# Patient Record
Sex: Female | Born: 1990 | Race: White | Hispanic: No | Marital: Married | Smoking: Never smoker
Health system: Southern US, Community
[De-identification: ages and names within clinical notes are randomized; demographics above are authoritative.]

## PROBLEM LIST (undated history)

## (undated) ENCOUNTER — Ambulatory Visit: Admission: EM | Payer: 59 | Source: Home / Self Care

## (undated) DIAGNOSIS — K2970 Gastritis, unspecified, without bleeding: Secondary | ICD-10-CM

## (undated) DIAGNOSIS — K259 Gastric ulcer, unspecified as acute or chronic, without hemorrhage or perforation: Secondary | ICD-10-CM

## (undated) DIAGNOSIS — K602 Anal fissure, unspecified: Secondary | ICD-10-CM

## (undated) DIAGNOSIS — F329 Major depressive disorder, single episode, unspecified: Secondary | ICD-10-CM

## (undated) DIAGNOSIS — N281 Cyst of kidney, acquired: Secondary | ICD-10-CM

## (undated) DIAGNOSIS — F32A Depression, unspecified: Secondary | ICD-10-CM

## (undated) DIAGNOSIS — Z87442 Personal history of urinary calculi: Secondary | ICD-10-CM

## (undated) DIAGNOSIS — R519 Headache, unspecified: Secondary | ICD-10-CM

## (undated) DIAGNOSIS — F419 Anxiety disorder, unspecified: Secondary | ICD-10-CM

## (undated) HISTORY — DX: Anal fissure, unspecified: K60.2

## (undated) HISTORY — PX: WISDOM TOOTH EXTRACTION: SHX21

## (undated) HISTORY — PX: COLONOSCOPY: SHX174

---
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
## 2011-04-17 ENCOUNTER — Emergency Department: Payer: Self-pay | Admitting: Emergency Medicine

## 2015-02-26 ENCOUNTER — Observation Stay: Payer: Self-pay

## 2015-03-24 ENCOUNTER — Observation Stay: Payer: Self-pay | Admitting: Obstetrics and Gynecology

## 2015-04-13 ENCOUNTER — Inpatient Hospital Stay
Admit: 2015-04-13 | Disposition: A | Discharge status: Home / Self Care | Payer: Self-pay | Attending: Obstetrics and Gynecology | Admitting: Obstetrics and Gynecology

## 2015-04-13 LAB — CBC WITH DIFFERENTIAL/PLATELET
BASOS ABS: 0.1 10*3/uL (ref 0.0–0.1)
BASOS PCT: 0.3 %
EOS PCT: 0.5 %
Eosinophil #: 0.1 10*3/uL (ref 0.0–0.7)
HCT: 36.9 % (ref 35.0–47.0)
HGB: 12.7 g/dL (ref 12.0–16.0)
LYMPHS ABS: 1.6 10*3/uL (ref 1.0–3.6)
Lymphocyte %: 10.1 %
MCH: 31.6 pg (ref 26.0–34.0)
MCHC: 34.5 g/dL (ref 32.0–36.0)
MCV: 92 fL (ref 80–100)
MONO ABS: 1.1 x10 3/mm — AB (ref 0.2–0.9)
MONOS PCT: 6.7 %
NEUTROS ABS: 13.1 10*3/uL — AB (ref 1.4–6.5)
Neutrophil %: 82.4 %
Platelet: 224 10*3/uL (ref 150–440)
RBC: 4.02 10*6/uL (ref 3.80–5.20)
RDW: 13.3 % (ref 11.5–14.5)
WBC: 15.9 10*3/uL — ABNORMAL HIGH (ref 3.6–11.0)

## 2015-04-15 LAB — HEMOGLOBIN: HGB: 11.1 g/dL — ABNORMAL LOW (ref 12.0–16.0)

## 2015-04-30 NOTE — Op Note (Signed)
PATIENT NAME:  Barbara Cole, Barbara Cole MR#:  [id number] DATE OF BIRTH:  1991/11/22  DATE OF PROCEDURE:  04/13/2015  PREOPERATIVE DIAGNOSES:  1.  Intrauterine pregnancy at term.  2.  Active labor.  3.  Group Cole streptococcus negative.  4.  O positive .  5.  Rubella nonimmune.   POSTOPERATIVE DIAGNOSES:  1.  Intrauterine pregnancy at term.  2.  Active labor.  3.  Group Cole streptococcus negative.  4.  O positive .  5.  Rubella nonimmune 6.  Delivery of a viable female infant.   SURGEON: Cline CoolsBethany E. Ryatt Corsino, MD  PROCEDURE: Normal spontaneous vaginal delivery.   ESTIMATED BLOOD LOSS: 300 mL.   ANESTHESIA: Epidural and 10 mL of 1% lidocaine locally.   SPECIMENS: Fetal cord blood.   COMPLICATIONS: None.   FINDINGS: A viable female infant, named Barbara Cole, with Apgars of 8 and 8 at one and five minutes respectively. No fetal weight is known at this time.  INDICATION FOR PROCEDURE: Ms. Barbara Cole is a 24 year old gravida 1, para 0 who presented at 39-4/7 weeks' estimated gestational age by last menstrual period with contractions. She was found to be early labor and was admitted for expectant management.   The patient did receive Pitocin augmentation as well as an epidural during labor. Fetal heart rate tracing remained category 1 throughout until the final stages of pushing. She had progressed slowly through active labor with artificial rupture of membranes for clear fluid and placement of an intrauterine pressure catheter for Pitocin titration. She did enter the second stage of labor at which point she began to push over an intact perineum.   The patient pushed effectively with the baby in an OP position for the final 10 minutes of the second stage. Fetal tachycardia and maternal tachycardia was noted; however, there was no maternal fever nor odor during this time. The baby was delivered over an intact perineum. Fetal shoulders followed the fetal head promptly. There was no nuchal cord. The baby  did cry on the perineum. The cord was doubly clamped and cut and the baby was passed onto the maternal abdomen where he was placed skin to skin. A 3-vessel cord was noted and cord blood was collected. The placenta was expressed with trailing membranes. Manual evaluation of the endometrial lining revealed retained membranes. These were teased out and manually extracted; a total of 4 intrauterine passes were required to extract all retained membranes. The fundus was firm and bleeding was minimal. Postpartum Pitocin was given.   Attention was turned to the perineum. A small first-degree perineal laceration was noted and repaired. Bilateral labial lacerations were noted and repaired with 3-0 Vicryl Rapide. A clitoral laceration was noted and was not repaired due to maternal discomfort and preference; this laceration was far from the urethral opening. The cervix was evaluated and found to be intact. Fundal pressure revealed a firm fundus with minimal bleeding.   The baby was taken to special care nursery for retractions and grunting with breathing. At this time, the baby's weight is unknown.   The patient tolerated this procedure well and is stable in the labor and delivery room.    ____________________________ Cline CoolsBethany E. Aaleeyah Bias, MD beb:bm D: 04/14/2015 00:44:59 ET T: 04/14/2015 07:43:23 ET JOB#: [id number]  cc: Cline CoolsBethany E. Maelin Kurkowski, MD, <Dictator> Cline CoolsBETHANY E Annayah Worthley MD ELECTRONICALLY SIGNED 04/14/2015 10:45

## 2015-05-09 NOTE — H&P (Signed)
L&D Evaluation:  History:  HPI 24 yo G1P000 with LMP of 07/10/14 & ED[facility] 04/16/15 [facility] for "UC's starting right after sex this pm". C/O "cramping in the Rt and Lt sides and lower back pain", No VB, LOF, decreased FM or any other S/S. PNC signficant for normal AFP, Rubella non-immune, O pos, AB screen neg, Pap neg and HIV neg. GBS not done yet.   Presents with back pain, contractions   Patient's Medical History Anxiety   Patient's Surgical History Colonoscopy, wisdom teeth, C4 fx   Medications Pre Natal Vitamins   Allergies NKDA   Social History none   Family History Non-Contributory   ROS:  ROS All systems were reviewed.  HEENT, CNS, GI, GU, Respiratory, CV, Renal and Musculoskeletal systems were found to be normal.   Exam:  Vital Signs stable   General no apparent distress, other   Mental Status clear   Chest clear   Heart normal sinus rhythm, no murmur/gallop/rubs   Abdomen gravid, non-tender   Estimated Fetal Weight Average for gestational age   Back no CVAT   Edema no edema   Reflexes 2+   Clonus negative   Pelvic FT/thick/high   Mebranes Intact   FHT normal rate with no decels, reactive NST with 2 accels 15 x 15 BPM   Ucx regular   Skin dry   Lymph no lymphadenopathy   Impression:  Impression IUP at 33 weeks with Th PTL   Plan:  Plan DC home   Comments Pelvic rest. Tylenol 650 mg po q 4 hours prn pain   Electronic Signatures: Sharee PimpleJones, Leyana Whidden W (CNM)  (Signed 28-Feb-16 01:58)  Authored: L&D Evaluation   Last Updated: 28-Feb-16 01:58 by Sharee PimpleJones, Wylder Macomber W (CNM)

## 2015-05-09 NOTE — H&P (Signed)
L&D Evaluation:  History:  HPI 24 yo G1 @39 +4 by  LMP c/w 19 week US presenting with contractions. They began at a rate of Q10 minutes and have persisted to now being Q5 minutes. They are more painful and lasting for a minute. She has had associated spotting but denies leaking fluid or decreased fetal movement. In triage, SVE changed from 2/50/-3 to 3/50/-3.   Presents with contractions   Patient's Medical History No Chronic Illness   Patient's Surgical History none   Medications Pre Natal Vitamins   Allergies NKDA   Social History none   Family History Non-Contributory   ROS:  ROS All systems were reviewed.  HEENT, CNS, GI, GU, Respiratory, CV, Renal and Musculoskeletal systems were found to be normal.   Exam:  Vital Signs stable   General no apparent distress   Mental Status clear   Chest clear   Heart normal sinus rhythm   Abdomen gravid, non-tender   Estimated Fetal Weight Average for gestational age   Pelvic no external lesions   Mebranes Intact   FHT normal rate with no decels   Ucx regular   Other O+ GBS neg Rub NI   Impression:  Impression early labor   Plan:  Comments 1. Labor: Plan to keep for monitoring. Will allow to walk. Continue to monitor progress. Epidural PRN 2. FWB: Well-grown, Cat 1 3. Anticipate SVD.   Electronic Signatures: Areta HaberGoodman, David M (MD)  (Signed 14-Apr-16 06:17)  Authored: L&D Evaluation   Last Updated: 14-Apr-16 06:17 by Areta HaberGoodman, David M (MD)

## 2016-08-23 DIAGNOSIS — H5203 Hypermetropia, bilateral: Secondary | ICD-10-CM | POA: Diagnosis not present

## 2017-01-31 DIAGNOSIS — F4323 Adjustment disorder with mixed anxiety and depressed mood: Secondary | ICD-10-CM | POA: Diagnosis not present

## 2017-09-19 DIAGNOSIS — H5213 Myopia, bilateral: Secondary | ICD-10-CM | POA: Diagnosis not present

## 2018-03-20 ENCOUNTER — Encounter: Payer: Self-pay | Admitting: Obstetrics and Gynecology

## 2019-02-17 DIAGNOSIS — J069 Acute upper respiratory infection, unspecified: Secondary | ICD-10-CM | POA: Diagnosis not present

## 2019-03-25 DIAGNOSIS — F419 Anxiety disorder, unspecified: Secondary | ICD-10-CM | POA: Diagnosis not present

## 2019-06-07 DIAGNOSIS — R319 Hematuria, unspecified: Secondary | ICD-10-CM | POA: Diagnosis not present

## 2019-06-07 DIAGNOSIS — R300 Dysuria: Secondary | ICD-10-CM | POA: Diagnosis not present

## 2019-06-07 DIAGNOSIS — M545 Low back pain: Secondary | ICD-10-CM | POA: Diagnosis not present

## 2019-06-18 ENCOUNTER — Encounter (HOSPITAL_BASED_OUTPATIENT_CLINIC_OR_DEPARTMENT_OTHER): Payer: Self-pay

## 2019-06-18 ENCOUNTER — Other Ambulatory Visit: Payer: Self-pay

## 2019-06-18 ENCOUNTER — Emergency Department (HOSPITAL_BASED_OUTPATIENT_CLINIC_OR_DEPARTMENT_OTHER)
Admission: EM | Admit: 2019-06-18 | Discharge: 2019-06-18 | Disposition: A | Discharge status: Home / Self Care | Payer: 59 | Attending: Emergency Medicine | Admitting: Emergency Medicine

## 2019-06-18 ENCOUNTER — Emergency Department (HOSPITAL_BASED_OUTPATIENT_CLINIC_OR_DEPARTMENT_OTHER): Payer: 59

## 2019-06-18 DIAGNOSIS — R079 Chest pain, unspecified: Secondary | ICD-10-CM | POA: Insufficient documentation

## 2019-06-18 DIAGNOSIS — Z79899 Other long term (current) drug therapy: Secondary | ICD-10-CM | POA: Insufficient documentation

## 2019-06-18 HISTORY — DX: Anxiety disorder, unspecified: F41.9

## 2019-06-18 LAB — CBC WITH DIFFERENTIAL/PLATELET
Abs Immature Granulocytes: 0.03 10*3/uL (ref 0.00–0.07)
Basophils Absolute: 0.1 10*3/uL (ref 0.0–0.1)
Basophils Relative: 1 %
Eosinophils Absolute: 0.1 10*3/uL (ref 0.0–0.5)
Eosinophils Relative: 1 %
HCT: 44 % (ref 36.0–46.0)
Hemoglobin: 14.6 g/dL (ref 12.0–15.0)
Immature Granulocytes: 0 %
Lymphocytes Relative: 15 %
Lymphs Abs: 1.7 10*3/uL (ref 0.7–4.0)
MCH: 30 pg (ref 26.0–34.0)
MCHC: 33.2 g/dL (ref 30.0–36.0)
MCV: 90.5 fL (ref 80.0–100.0)
Monocytes Absolute: 0.7 10*3/uL (ref 0.1–1.0)
Monocytes Relative: 6 %
Neutro Abs: 8.4 10*3/uL — ABNORMAL HIGH (ref 1.7–7.7)
Neutrophils Relative %: 77 %
Platelets: 308 10*3/uL (ref 150–400)
RBC: 4.86 MIL/uL (ref 3.87–5.11)
RDW: 12 % (ref 11.5–15.5)
WBC: 10.9 10*3/uL — ABNORMAL HIGH (ref 4.0–10.5)
nRBC: 0 % (ref 0.0–0.2)

## 2019-06-18 LAB — COMPREHENSIVE METABOLIC PANEL
ALT: 19 U/L (ref 0–44)
AST: 21 U/L (ref 15–41)
Albumin: 4.3 g/dL (ref 3.5–5.0)
Alkaline Phosphatase: 61 U/L (ref 38–126)
Anion gap: 10 (ref 5–15)
BUN: 12 mg/dL (ref 6–20)
CO2: 23 mmol/L (ref 22–32)
Calcium: 9.3 mg/dL (ref 8.9–10.3)
Chloride: 106 mmol/L (ref 98–111)
Creatinine, Ser: 0.77 mg/dL (ref 0.44–1.00)
GFR calc Af Amer: >60 mL/min (ref >60)
GFR calc non Af Amer: >60 mL/min (ref >60)
Glucose, Bld: 97 mg/dL (ref 70–99)
Potassium: 3.7 mmol/L (ref 3.5–5.1)
Sodium: 139 mmol/L (ref 135–145)
Total Bilirubin: 1 mg/dL (ref 0.3–1.2)
Total Protein: 7.9 g/dL (ref 6.5–8.1)

## 2019-06-18 LAB — LIPASE, BLOOD: Lipase: 33 U/L (ref 11–51)

## 2019-06-18 LAB — TROPONIN I: Troponin I: <0.03 ng/mL (ref <0.03)

## 2019-06-18 MED ORDER — SUCRALFATE 1 G PO TABS: 1.0000 g | ORAL_TABLET | Freq: Three times a day (TID) | ORAL | 0 refills | Status: DC

## 2019-06-18 MED ORDER — PANTOPRAZOLE SODIUM 20 MG PO TBEC: 20.0000 mg | DELAYED_RELEASE_TABLET | Freq: Two times a day (BID) | ORAL | 0 refills | Status: DC

## 2019-06-18 MED ORDER — ALUM & MAG HYDROXIDE-SIMETH 200-200-20 MG/5ML PO SUSP
30.0000 mL | Freq: Once | ORAL | Status: AC
  Administered 2019-06-18: 30 mL via ORAL
  Filled 2019-06-18: qty 30

## 2019-06-18 MED ORDER — LIDOCAINE VISCOUS HCL 2 % MT SOLN
15.0000 mL | Freq: Once | OROMUCOSAL | Status: AC
  Administered 2019-06-18: 14:00:00 15 mL via ORAL
  Filled 2019-06-18: qty 15

## 2019-06-18 MED FILL — PANTOPRAZOLE SOD DR 20 MG T: 20 | 30 days supply | Qty: 60 | Fill #0

## 2019-06-18 MED FILL — SUCRALFATE 1 GM TABLET: 1 | 30 days supply | Qty: 120 | Fill #0

## 2019-06-18 NOTE — ED Provider Notes (Signed)
[facility] EMERGENCY DEPARTMENT Provider Note   CSN: [id number] Arrival date & time: 06/18/19  1232    History   Chief Complaint Chief Complaint  Patient presents with  . Chest Pain    HPI Barbara Cole is a 28 y.o. female.     The history is provided by the patient and medical records. No language interpreter was used.  Chest Pain Associated symptoms: abdominal pain and nausea   Associated symptoms: no palpitations and no vomiting      Barbara Cole is a 28 y.o. female  with a PMH of anxiety who presents to the Emergency Department complaining of epigastric pain.  Patient states that she woke up at about 345 this morning.  Pain actually woke her from her sleep.  She reports burning, constant epigastric pain.  She started her menstrual cycle yesterday, so assumed this was just a weird type of cramping.  She took Pamprin, but it did not help.  When she laid down, the pain began to radiate up towards her chest.  She felt very flushed and did not feel well.  She thought if she got up and moves around, maybe eating something it would feel better.  She tried to eat a cracker, but suddenly developed chills and was very nauseous.  That was around 630 this morning.  She then called 911.  EMS went to the house and evaluate.  Reports normal vitals and exam.  They told her they thought it was reflux and recommended that she get Pepcid.  She did take Pepcid and felt as if it helped a great deal initially, but her pain returned.  She also reports taking Tums which made some improvement as well.  She was starting to feel better, therefore went to meet her brother for lunch.  She took 1 or 2 bites of her food in the same sensation occurred with burning epigastric pain and a "cold" sensation along her chest wall.  She became worried, therefore came to emergency department for further evaluation. No urinary symptoms. Denies any vomiting, diarrhea, constipation or blood in the stool.  No  fevers, shortness of breath or cough.  No personal history of hypertension, hyperlipidemia, diabetes or heart disease.  No family cardiac history.  No recent travel/surgery/immobilizations.  Not on OCPs.  Denies any history of similar episodes in the past.  No fam cardiac hx. No recent travel / surgeries. No leg swelling.   Past Medical History:  Diagnosis Date  . Anxiety     There are no active problems to display for this patient.   History reviewed. No pertinent surgical history.   OB History   No obstetric history on file.      Home Medications    Prior to Admission medications   Medication Sig Start Date End Date Taking? Authorizing Provider  sertraline (ZOLOFT) 25 MG tablet Take 25 mg by mouth daily.   Yes [provider]  pantoprazole (PROTONIX) 20 MG tablet Take 1 tablet (20 mg total) by mouth 2 (two) times daily. 06/18/19   Sissi Padia, Chase PicketJaime Pilcher, PA-C  sucralfate (CARAFATE) 1 g tablet Take 1 tablet (1 g total) by mouth 4 (four) times daily -  with meals and at bedtime. 06/18/19   Bari Leib, Chase PicketJaime Pilcher, PA-C    Family History No family history on file.  Social History Social History   Tobacco Use  . Smoking status: Never Smoker  . Smokeless tobacco: Never Used  Substance Use Topics  . Alcohol  use: Never    Frequency: Never  . Drug use: Never     Allergies   Patient has no known allergies.   Review of Systems Review of Systems  Cardiovascular: Positive for chest pain. Negative for palpitations and leg swelling.  Gastrointestinal: Positive for abdominal pain and nausea. Negative for constipation, diarrhea, rectal pain and vomiting.  Neurological: Positive for light-headedness.  All other systems reviewed and are negative.    Physical Exam Updated Vital Signs BP (!) 138/98   Pulse 83   Temp 98.6 F (37 C) (Oral)   Resp 11   Ht 5\' 2"  (1.575 m)   Wt 87.1 kg   LMP 06/17/2019   SpO2 98%   BMI 35.12 kg/m   Physical Exam Vitals signs and  nursing note reviewed.  Constitutional:      General: She is not in acute distress.    Appearance: She is well-developed.  HENT:     Head: Normocephalic and atraumatic.  Neck:     Musculoskeletal: Neck supple.  Cardiovascular:     Rate and Rhythm: Normal rate and regular rhythm.     Heart sounds: Normal heart sounds. No murmur.  Pulmonary:     Effort: Pulmonary effort is normal. No respiratory distress.     Breath sounds: Normal breath sounds.  Abdominal:     General: There is no distension.     Palpations: Abdomen is soft.     Comments: Tenderness to the epigastrium.  No right upper quadrant tenderness.  Negative Murphy's.  No flank or CVA tenderness.  No rebound or guarding.  Musculoskeletal:        General: No swelling or tenderness.     Right lower leg: No edema.     Left lower leg: No edema.  Skin:    General: Skin is warm and dry.  Neurological:     Mental Status: She is alert and oriented to person, place, and time.      ED Treatments / Results  Labs (all labs ordered are listed, but only abnormal results are displayed) Labs Reviewed  CBC WITH DIFFERENTIAL/PLATELET - Abnormal; Notable for the following components:      Result Value   WBC 10.9 (*)    Neutro Abs 8.4 (*)    All other components within normal limits  COMPREHENSIVE METABOLIC PANEL  LIPASE, BLOOD  TROPONIN I    EKG None  ED ECG REPORT   Date: 06/18/2019  Rate: 89  Rhythm: normal sinus rhythm  QRS Axis: normal  Intervals: normal  ST/T Wave abnormalities: normal  Conduction Disutrbances:none  Narrative Interpretation:   Old EKG Reviewed: unchanged  I have personally reviewed the EKG tracing with attending, Dr. Madilyn Hookees and we agree with the computerized printout as noted.  Radiology Dg Chest 2 View  Result Date: 06/18/2019 CLINICAL DATA:  Chest pain and nausea since this am,nonsmoker EXAM: CHEST - 2 VIEW COMPARISON:  none FINDINGS: Lungs are clear. Heart size and mediastinal contours are  within normal limits. No effusion.  No pneumothorax. Visualized bones unremarkable. IMPRESSION: No acute cardiopulmonary disease. Electronically Signed   By: Corlis Leak  Hassell M.D.   On: 06/18/2019 14:07    Procedures Procedures (including critical care time)  Medications Ordered in ED Medications  alum & mag hydroxide-simeth (MAALOX/MYLANTA) 200-200-20 MG/5ML suspension 30 mL (30 mLs Oral Given 06/18/19 1400)    And  lidocaine (XYLOCAINE) 2 % viscous mouth solution 15 mL (15 mLs Oral Given 06/18/19 1400)     Initial Impression /  Assessment and Plan / ED Course  I have reviewed the triage vital signs and the nursing notes.  Pertinent labs & imaging results that were available during my care of the patient were reviewed by me and considered in my medical decision making (see chart for details).     Barbara Cole is a 28 y.o. female who presents to ED for gastric abdominal pain which radiates to her chest which began this morning.  On exam, patient does have tenderness to the epigastrium.  No tenderness to the right upper quadrant.  Negative Murphy's.  Labs reviewed and reassuring with negative troponin. CXR with no acute abnormalities.  EKG unchanged from previous. PERC negative Low risk heart score of 0.   Patient's symptoms unlikely to be of cardiac etiology.  Suspect gastritis versus peptic ulcer.  She did feel much improved after GI cocktail.  We will start her on Protonix and Carafate and have her follow-up with her primary care doctor. Patient understands return precautions and follow up plan. All questions answered.  Final Clinical Impressions(s) / ED Diagnoses   Final diagnoses:  Chest pain    ED Discharge Orders         Ordered    sucralfate (CARAFATE) 1 g tablet  3 times daily with meals & bedtime     06/18/19 1457    pantoprazole (PROTONIX) 20 MG tablet  2 times daily     06/18/19 1457           Wilburn Keir, Ozella Almond, PA-C 06/18/19 1502    Quintella Reichert, MD  06/19/19 1318

## 2019-06-18 NOTE — Discharge Instructions (Signed)
It was my pleasure taking care of you today!   Fortunately, your EKG, labs and x-ray today looked great.   Take Carafate and Protonix as directed.   Call your doctor today or first thing Monday morning to schedule a follow up appointment.   Return to ER for new or worsening symptoms, any additional concerns.

## 2019-06-18 NOTE — ED Triage Notes (Addendum)
Pt c/o central CP and epigastric pain that started at 345am-states she felt like she was going to pass out-EMS was called out-pt refused transport-NAD-tearful-steady gait-states she came in because "the feeling came back" while she was at lunch just PTA

## 2019-06-18 NOTE — ED Notes (Signed)
Pt states burning in chest persists, VSS, awaiting provider evaluation.

## 2019-06-18 NOTE — ED Notes (Signed)
Pt states epigastric burning sensation occurred at 3am, took pepcid at that time and tums this morning with some relief.  Pt reports attempting to eat lunch today and had sudden burning epigastric area again and felt sweaty and weak.

## 2019-06-19 ENCOUNTER — Emergency Department (HOSPITAL_BASED_OUTPATIENT_CLINIC_OR_DEPARTMENT_OTHER)
Admission: EM | Admit: 2019-06-19 | Discharge: 2019-06-19 | Disposition: A | Discharge status: Home / Self Care | Payer: 59 | Attending: Emergency Medicine | Admitting: Emergency Medicine

## 2019-06-19 ENCOUNTER — Encounter (HOSPITAL_BASED_OUTPATIENT_CLINIC_OR_DEPARTMENT_OTHER): Payer: Self-pay | Admitting: *Deleted

## 2019-06-19 ENCOUNTER — Encounter: Payer: Self-pay | Admitting: Emergency Medicine

## 2019-06-19 ENCOUNTER — Emergency Department
Admission: EM | Admit: 2019-06-19 | Discharge: 2019-06-19 | Disposition: A | Discharge status: Home / Self Care | Payer: 59 | Attending: Emergency Medicine | Admitting: Emergency Medicine

## 2019-06-19 ENCOUNTER — Other Ambulatory Visit: Payer: Self-pay

## 2019-06-19 DIAGNOSIS — R1013 Epigastric pain: Secondary | ICD-10-CM | POA: Insufficient documentation

## 2019-06-19 DIAGNOSIS — M549 Dorsalgia, unspecified: Secondary | ICD-10-CM | POA: Diagnosis not present

## 2019-06-19 DIAGNOSIS — Z5321 Procedure and treatment not carried out due to patient leaving prior to being seen by health care provider: Secondary | ICD-10-CM | POA: Insufficient documentation

## 2019-06-19 DIAGNOSIS — R079 Chest pain, unspecified: Secondary | ICD-10-CM | POA: Diagnosis not present

## 2019-06-19 DIAGNOSIS — R110 Nausea: Secondary | ICD-10-CM | POA: Diagnosis not present

## 2019-06-19 LAB — URINALYSIS, COMPLETE (UACMP) WITH MICROSCOPIC
Bilirubin Urine: NEGATIVE
Glucose, UA: NEGATIVE mg/dL
Hgb urine dipstick: NEGATIVE
Ketones, ur: NEGATIVE mg/dL
Leukocytes,Ua: NEGATIVE
Nitrite: NEGATIVE
Protein, ur: NEGATIVE mg/dL
Specific Gravity, Urine: 1.02 (ref 1.005–1.030)
pH: 5 (ref 5.0–8.0)

## 2019-06-19 LAB — CBC
HCT: 46 % (ref 36.0–46.0)
Hemoglobin: 15.5 g/dL — ABNORMAL HIGH (ref 12.0–15.0)
MCH: 29.9 pg (ref 26.0–34.0)
MCHC: 33.7 g/dL (ref 30.0–36.0)
MCV: 88.8 fL (ref 80.0–100.0)
Platelets: 325 10*3/uL (ref 150–400)
RBC: 5.18 MIL/uL — ABNORMAL HIGH (ref 3.87–5.11)
RDW: 12.1 % (ref 11.5–15.5)
WBC: 7 10*3/uL (ref 4.0–10.5)
nRBC: 0 % (ref 0.0–0.2)

## 2019-06-19 LAB — COMPREHENSIVE METABOLIC PANEL
ALT: 21 U/L (ref 0–44)
AST: 21 U/L (ref 15–41)
Albumin: 4.4 g/dL (ref 3.5–5.0)
Alkaline Phosphatase: 59 U/L (ref 38–126)
Anion gap: 10 (ref 5–15)
BUN: 13 mg/dL (ref 6–20)
CO2: 24 mmol/L (ref 22–32)
Calcium: 9.5 mg/dL (ref 8.9–10.3)
Chloride: 107 mmol/L (ref 98–111)
Creatinine, Ser: 0.9 mg/dL (ref 0.44–1.00)
GFR calc Af Amer: >60 mL/min (ref >60)
GFR calc non Af Amer: >60 mL/min (ref >60)
Glucose, Bld: 110 mg/dL — ABNORMAL HIGH (ref 70–99)
Potassium: 3.8 mmol/L (ref 3.5–5.1)
Sodium: 141 mmol/L (ref 135–145)
Total Bilirubin: 1.6 mg/dL — ABNORMAL HIGH (ref 0.3–1.2)
Total Protein: 7.9 g/dL (ref 6.5–8.1)

## 2019-06-19 LAB — PREGNANCY, URINE: Preg Test, Ur: NEGATIVE

## 2019-06-19 LAB — LIPASE, BLOOD: Lipase: 35 U/L (ref 11–51)

## 2019-06-19 MED ORDER — SODIUM CHLORIDE 0.9% FLUSH: 3.0000 mL | Freq: Once | INTRAVENOUS | Status: DC

## 2019-06-19 MED ORDER — LIDOCAINE VISCOUS HCL 2 % MT SOLN
15.0000 mL | Freq: Once | OROMUCOSAL | Status: AC
  Administered 2019-06-19: 15 mL via ORAL
  Filled 2019-06-19: qty 15

## 2019-06-19 MED ORDER — MAALOX MAX 400-400-40 MG/5ML PO SUSP: 10.0000 mL | Freq: Four times a day (QID) | ORAL | 0 refills | Status: DC | PRN

## 2019-06-19 MED ORDER — ALUM & MAG HYDROXIDE-SIMETH 200-200-20 MG/5ML PO SUSP
30.0000 mL | Freq: Once | ORAL | Status: AC
  Administered 2019-06-19: 30 mL via ORAL
  Filled 2019-06-19: qty 30

## 2019-06-19 MED ORDER — ONDANSETRON 4 MG PO TBDP
4.0000 mg | ORAL_TABLET | Freq: Once | ORAL | Status: AC | PRN
  Administered 2019-06-19: 4 mg via ORAL
  Filled 2019-06-19: qty 1

## 2019-06-19 MED ORDER — ONDANSETRON 4 MG PO TBDP: 4.0000 mg | ORAL_TABLET | Freq: Three times a day (TID) | ORAL | 0 refills | Status: DC | PRN

## 2019-06-19 NOTE — ED Provider Notes (Signed)
Emergency Department Provider Note   I have reviewed the triage vital signs and the nursing notes.   HISTORY  Chief Complaint Epigastric pain   HPI Barbara Cole is a 28 y.o. female presents to the ED with continued epigastric pain. Pain is burning in quality and radiates to the left shoulder with "tingling" sensation. No SOB or heart palpitations. No fever or chills was in the ED yesterday for same and improved with GI cocktail but pain returned. Patient has been taking prescribed meds. Labs, imaging, and EKG at that time were negative. [facility] and LWBS from the waiting room and presented here. Labs drawn in the ED prior to leaving. Patient without significant pain at this time. Was given Zofran there and feeling better. No fever or chills.    Past Medical History:  Diagnosis Date  . Anxiety     There are no active problems to display for this patient.   History reviewed. No pertinent surgical history.  Allergies Patient has no known allergies.  History reviewed. No pertinent family history.  Social History Social History   Tobacco Use  . Smoking status: Never Smoker  . Smokeless tobacco: Never Used  Substance Use Topics  . Alcohol use: Never    Frequency: Never  . Drug use: Never    Review of Systems  Constitutional: No fever/chills Eyes: No visual changes. ENT: No sore throat. Cardiovascular: Denies chest pain. Respiratory: Denies shortness of breath. Gastrointestinal: Positive epigastric abdominal pain.  Positive nausea, no vomiting.  No diarrhea.  No constipation. Genitourinary: Negative for dysuria. Musculoskeletal: Negative for back pain. Skin: Negative for rash. Neurological: Negative for headaches, focal weakness or numbness.  10-point ROS otherwise negative.  ____________________________________________   PHYSICAL EXAM:  VITAL SIGNS: ED Triage Vitals  Enc Vitals Group     BP 06/19/19 1111 (!) 127/92    Pulse Rate 06/19/19 1111 73     Resp 06/19/19 1111 (!) 23     Temp 06/19/19 1111 98.2 F (36.8 C)     Temp Source 06/19/19 1111 Oral     SpO2 06/19/19 1111 99 %     Weight 06/19/19 1113 192 lb 0.3 oz (87.1 kg)     Height 06/19/19 1113 5\' 2"  (1.575 m)   Constitutional: Alert and oriented. Well appearing and in no acute distress. Eyes: Conjunctivae are normal. Head: Atraumatic. Nose: No congestion/rhinnorhea. Mouth/Throat: Mucous membranes are moist.  Neck: No stridor.  Cardiovascular: Normal rate, regular rhythm. Good peripheral circulation. Grossly normal heart sounds.   Respiratory: Normal respiratory effort.  No retractions. Lungs CTAB. Gastrointestinal: Soft and nontender. Specifically no RUQ tenderness or Murphy's sign. No distention.  Musculoskeletal: No gross deformities of extremities. Neurologic:  Normal speech and language.  Skin:  Skin is warm, dry and intact. No rash noted.  ____________________________________________   LABS (all labs ordered are listed, but only abnormal results are displayed)  Labs Reviewed  PREGNANCY, URINE   ____________________________________________  EKG  [facility] No STEMI  ____________________________________________  RADIOLOGY  No results found.  ____________________________________________   PROCEDURES  Procedure(s) performed:   Procedures  None ____________________________________________   INITIAL IMPRESSION / ASSESSMENT AND PLAN / ED COURSE  Pertinent labs & imaging results that were available during my care of the patient were reviewed by me and considered in my medical decision making (see chart for details).   Patient presents to the ED with epigastric pain with atypical radiation symptoms to the chest. Doubt ACS/PE.  Had full w/u in the ED yesterday and improved with GI cocktail. Had labs drawn this AM at [facility] with no significant change other than slightly elevated bilirubin. Normal AST/ALT. No  focal tenderness on exam. No further imaging at this time. Added urine pregnancy which was negative. Will discharge with plan for PCP and GI follow up with Maalox and Zofran PRN along with already prescribed Protonix.    ____________________________________________  FINAL CLINICAL IMPRESSION(S) / ED DIAGNOSES  Final diagnoses:  Epigastric abdominal pain     MEDICATIONS GIVEN DURING THIS VISIT:  Medications  alum & mag hydroxide-simeth (MAALOX/MYLANTA) 200-200-20 MG/5ML suspension 30 mL (30 mLs Oral Given 06/19/19 1205)    And  lidocaine (XYLOCAINE) 2 % viscous mouth solution 15 mL (15 mLs Oral Given 06/19/19 1205)     NEW OUTPATIENT MEDICATIONS STARTED DURING THIS VISIT:  Discharge Medication List as of 06/19/2019 12:43 PM    START taking these medications   Details  alum & mag hydroxide-simeth (MAALOX MAX) 400-400-40 MG/5ML suspension Take 10 mLs by mouth every 6 (six) hours as needed for indigestion., Starting Sat 06/19/2019, Print    ondansetron (ZOFRAN ODT) 4 MG disintegrating tablet Take 1 tablet (4 mg total) by mouth every 8 (eight) hours as needed for nausea., Starting Sat 06/19/2019, Print        Note:  This document was prepared using Dragon voice recognition software and may include unintentional dictation errors.  Nanda Quinton, MD Emergency Medicine    Icarus Partch, [facility], MD 06/20/19 [phone]

## 2019-06-19 NOTE — Discharge Instructions (Signed)

## 2019-06-19 NOTE — ED Triage Notes (Signed)
Burning sensation to epigastric area.  Patient was here yesterday for the same complaint.  Had GI cocktail yesterday with good relief.  Pain came back at 1930, took Sucralfate and Pantoprazole with no relief.  Went to [facility], blood drawn done and she left d/t long wait.

## 2019-06-19 NOTE — ED Triage Notes (Signed)
Pt to ED via POV c/o chest pain and nausea. Pt seen at [facility] yesterday and diagnosed with GERD and possible ulcer. Pt was given 2 medications but states that she does not feel like it is helping. Pt has not vomited. Pt is in NAD.

## 2019-06-21 DIAGNOSIS — R110 Nausea: Secondary | ICD-10-CM | POA: Diagnosis not present

## 2019-06-21 DIAGNOSIS — K219 Gastro-esophageal reflux disease without esophagitis: Secondary | ICD-10-CM | POA: Diagnosis not present

## 2019-06-21 DIAGNOSIS — R1013 Epigastric pain: Secondary | ICD-10-CM | POA: Diagnosis not present

## 2019-06-22 ENCOUNTER — Encounter: Payer: Self-pay | Admitting: Intensive Care

## 2019-06-22 ENCOUNTER — Emergency Department
Admission: EM | Admit: 2019-06-22 | Discharge: 2019-06-22 | Disposition: A | Discharge status: Home / Self Care | Payer: 59 | Attending: Emergency Medicine | Admitting: Emergency Medicine

## 2019-06-22 ENCOUNTER — Other Ambulatory Visit: Payer: Self-pay

## 2019-06-22 ENCOUNTER — Emergency Department: Payer: 59

## 2019-06-22 DIAGNOSIS — K2900 Acute gastritis without bleeding: Secondary | ICD-10-CM

## 2019-06-22 DIAGNOSIS — N281 Cyst of kidney, acquired: Secondary | ICD-10-CM | POA: Diagnosis not present

## 2019-06-22 DIAGNOSIS — R1010 Upper abdominal pain, unspecified: Secondary | ICD-10-CM | POA: Diagnosis not present

## 2019-06-22 DIAGNOSIS — R1011 Right upper quadrant pain: Secondary | ICD-10-CM | POA: Diagnosis not present

## 2019-06-22 DIAGNOSIS — K2970 Gastritis, unspecified, without bleeding: Secondary | ICD-10-CM | POA: Diagnosis not present

## 2019-06-22 DIAGNOSIS — N200 Calculus of kidney: Secondary | ICD-10-CM | POA: Diagnosis not present

## 2019-06-22 DIAGNOSIS — R1013 Epigastric pain: Secondary | ICD-10-CM | POA: Diagnosis not present

## 2019-06-22 LAB — CBC
HCT: 44.8 % (ref 36.0–46.0)
Hemoglobin: 15.3 g/dL — ABNORMAL HIGH (ref 12.0–15.0)
MCH: 30.7 pg (ref 26.0–34.0)
MCHC: 34.2 g/dL (ref 30.0–36.0)
MCV: 89.8 fL (ref 80.0–100.0)
Platelets: 325 10*3/uL (ref 150–400)
RBC: 4.99 MIL/uL (ref 3.87–5.11)
RDW: 11.8 % (ref 11.5–15.5)
WBC: 9.5 10*3/uL (ref 4.0–10.5)
nRBC: 0 % (ref 0.0–0.2)

## 2019-06-22 LAB — COMPREHENSIVE METABOLIC PANEL WITH GFR
ALT: 35 U/L (ref 0–44)
AST: 32 U/L (ref 15–41)
Albumin: 4.6 g/dL (ref 3.5–5.0)
Alkaline Phosphatase: 62 U/L (ref 38–126)
Anion gap: 8 (ref 5–15)
BUN: 15 mg/dL (ref 6–20)
CO2: 23 mmol/L (ref 22–32)
Calcium: 9.2 mg/dL (ref 8.9–10.3)
Chloride: 108 mmol/L (ref 98–111)
Creatinine, Ser: 0.86 mg/dL (ref 0.44–1.00)
GFR calc Af Amer: >60 mL/min
GFR calc non Af Amer: >60 mL/min
Glucose, Bld: 100 mg/dL — ABNORMAL HIGH (ref 70–99)
Potassium: 3.9 mmol/L (ref 3.5–5.1)
Sodium: 139 mmol/L (ref 135–145)
Total Bilirubin: 1.3 mg/dL — ABNORMAL HIGH (ref 0.3–1.2)
Total Protein: 7.9 g/dL (ref 6.5–8.1)

## 2019-06-22 LAB — URINALYSIS, COMPLETE (UACMP) WITH MICROSCOPIC
Bilirubin Urine: NEGATIVE
Glucose, UA: NEGATIVE mg/dL
Hgb urine dipstick: NEGATIVE
Ketones, ur: 20 mg/dL — AB
Leukocytes,Ua: NEGATIVE
Nitrite: NEGATIVE
Protein, ur: NEGATIVE mg/dL
Specific Gravity, Urine: 1.019 (ref 1.005–1.030)
pH: 5 (ref 5.0–8.0)

## 2019-06-22 LAB — PREGNANCY, URINE: Preg Test, Ur: NEGATIVE

## 2019-06-22 LAB — LIPASE, BLOOD: Lipase: 36 U/L (ref 11–51)

## 2019-06-22 MED ORDER — SODIUM CHLORIDE 0.9 % IV BOLUS
1000.0000 mL | Freq: Once | INTRAVENOUS | Status: AC
  Administered 2019-06-22: 1000 mL via INTRAVENOUS

## 2019-06-22 MED ORDER — FAMOTIDINE 20 MG PO TABS: 20.0000 mg | ORAL_TABLET | Freq: Two times a day (BID) | ORAL | 0 refills | Status: DC

## 2019-06-22 MED ORDER — ONDANSETRON HCL 4 MG/2ML IJ SOLN
4.0000 mg | Freq: Once | INTRAMUSCULAR | Status: AC
  Administered 2019-06-22: 4 mg via INTRAVENOUS
  Filled 2019-06-22: qty 2

## 2019-06-22 MED ORDER — KETOROLAC TROMETHAMINE 30 MG/ML IJ SOLN
15.0000 mg | INTRAMUSCULAR | Status: AC
  Administered 2019-06-22: 15 mg via INTRAVENOUS
  Filled 2019-06-22: qty 1

## 2019-06-22 MED ORDER — ONDANSETRON 4 MG PO TBDP
4.0000 mg | ORAL_TABLET | Freq: Once | ORAL | Status: DC | PRN
  Filled 2019-06-22: qty 1

## 2019-06-22 MED ORDER — METOCLOPRAMIDE HCL 10 MG PO TABS: 10.0000 mg | ORAL_TABLET | Freq: Four times a day (QID) | ORAL | 0 refills | Status: DC | PRN

## 2019-06-22 MED ORDER — IOHEXOL 300 MG/ML  SOLN
100.0000 mL | Freq: Once | INTRAMUSCULAR | Status: AC | PRN
  Administered 2019-06-22: 100 mL via INTRAVENOUS

## 2019-06-22 NOTE — ED Notes (Signed)
Patient transported to CT 

## 2019-06-22 NOTE — ED Triage Notes (Signed)
Patient presents with epigastric pain and nausea X 3 days. Was seen for same on 06/18/19 and 06/19/19.

## 2019-06-22 NOTE — Discharge Instructions (Addendum)
Your labs, ultrasound of the liver and gallbladder, and CT scan of the abdomen were all okay today.  Continue the medications your doctor has you taking, continue following a simple diet, and add on Reglan and famotidine for the next week.  Follow-up with your gastroenterologist as scheduled.

## 2019-06-22 NOTE — ED Notes (Signed)
Esign not working, pt verbalized discharge insctructions and has no questions at this time

## 2019-06-22 NOTE — ED Provider Notes (Addendum)
[facility] Emergency Department Provider Note  ____________________________________________  Time seen: Approximately 3:30 PM  I have reviewed the triage vital signs and the nursing notes.   HISTORY  Chief Complaint Abdominal Pain and Nausea    HPI Barbara Cole is a 28 y.o. female with a past medical history of anxiety who comes to the ED complaining of epigastric and right upper quadrant abdominal pain, constant for the past 5 days, radiating to the right scapular area.  Feels sharp and achy.  Worse with oral intake, no alleviating factors.  Severe.  The patient has been to the ED 2 previous times in the last 5 days.  Each time, labs were checked, she received some medicines that helped her feel better, and was discharged home.  She has not had any abdominal imaging during this time.  Reviewed the chart, her labs and chest x-ray from previous visits were unremarkable.  Patient denies ever having symptoms like this before.  She does endorse chills but denies fever.  No cough chest pain shortness of breath or recent travel trauma hospitalizations or surgeries.  She does note that about a week ago she noticed some blood tinge in her urine and thought she might have passed a kidney stone.  No unusual vaginal bleeding or discharge  Past Medical History:  Diagnosis Date  . Anxiety      There are no active problems to display for this patient.    History reviewed. No pertinent surgical history.   Prior to Admission medications   Medication Sig Start Date End Date Taking? Authorizing Provider  alum & mag hydroxide-simeth (MAALOX MAX) 400-400-40 MG/5ML suspension Take 10 mLs by mouth every 6 (six) hours as needed for indigestion. 06/19/19   Long, Wonda Olds, MD  famotidine (PEPCID) 20 MG tablet Take 1 tablet (20 mg total) by mouth 2 (two) times daily. 06/22/19   Carrie Mew, MD  metoCLOPramide (REGLAN) 10 MG tablet Take 1 tablet (10 mg total) by mouth  every 6 (six) hours as needed. 06/22/19   Carrie Mew, MD  ondansetron (ZOFRAN ODT) 4 MG disintegrating tablet Take 1 tablet (4 mg total) by mouth every 8 (eight) hours as needed for nausea. 06/19/19   Long, Wonda Olds, MD  pantoprazole (PROTONIX) 20 MG tablet Take 1 tablet (20 mg total) by mouth 2 (two) times daily. 06/18/19   Ward, Ozella Almond, PA-C  sertraline (ZOLOFT) 25 MG tablet Take 25 mg by mouth daily.    [provider]  sucralfate (CARAFATE) 1 g tablet Take 1 tablet (1 g total) by mouth 4 (four) times daily -  with meals and at bedtime. 06/18/19   Ward, Ozella Almond, PA-C     Allergies Patient has no known allergies.   History reviewed. No pertinent family history.  Social History Social History   Tobacco Use  . Smoking status: Never Smoker  . Smokeless tobacco: Never Used  Substance Use Topics  . Alcohol use: Never    Frequency: Never  . Drug use: Never    Review of Systems  Constitutional:   No fever positive chills.  ENT:   No sore throat. No rhinorrhea. Cardiovascular:   No chest pain or syncope. Respiratory:   No dyspnea or cough. Gastrointestinal:   Positive upper abdominal pain as above with vomiting.  No constipation or diarrhea..  Musculoskeletal:   Negative for focal pain or swelling All other systems reviewed and are negative except as documented above in ROS and HPI.  ____________________________________________  PHYSICAL EXAM:  VITAL SIGNS: ED Triage Vitals  Enc Vitals Group     BP 06/22/19 1226 136/86     Pulse Rate 06/22/19 1226 95     Resp 06/22/19 1226 16     Temp 06/22/19 1226 98.5 F (36.9 C)     Temp Source 06/22/19 1226 Oral     SpO2 06/22/19 1226 97 %     Weight 06/22/19 1227 185 lb (83.9 kg)     Height 06/22/19 1227 5\' 2"  (1.575 m)     Head Circumference --      Peak Flow --      Pain Score 06/22/19 1227 10     Pain Loc --      Pain Edu? --      Excl. in GC? --     Vital signs reviewed, nursing assessments  reviewed.   Constitutional:   Alert and oriented. Non-toxic appearance. Eyes:   Conjunctivae are normal. EOMI. PERRL. ENT      Head:   Normocephalic and atraumatic.      Nose:   No congestion/rhinnorhea.       Mouth/Throat:   Dry mucous membranes, no pharyngeal erythema. No peritonsillar mass.       Neck:   No meningismus. Full ROM. Hematological/Lymphatic/Immunilogical:   No cervical lymphadenopathy. Cardiovascular:   RRR. Symmetric bilateral radial and DP pulses.  No murmurs. Cap refill less than 2 seconds. Respiratory:   Normal respiratory effort without tachypnea/retractions. Breath sounds are clear and equal bilaterally. No wheezes/rales/rhonchi. Gastrointestinal:   Soft with diffuse tenderness in the epigastrium, right upper quadrant, and right hemiabdomen.  Worse in the right upper quadrant where she is exquisitely tender. Non distended. .  No rebound, rigidity, or guarding. Musculoskeletal:   Normal range of motion in all extremities. No joint effusions.  No lower extremity tenderness.  No edema. Neurologic:   Normal speech and language.  Motor grossly intact. No acute focal neurologic deficits are appreciated.  Skin:    Skin is warm, dry and intact. No rash noted.  No petechiae, purpura, or bullae.  ____________________________________________    LABS (pertinent positives/negatives) (all labs ordered are listed, but only abnormal results are displayed) Labs Reviewed  COMPREHENSIVE METABOLIC PANEL - Abnormal; Notable for the following components:      Result Value   Glucose, Bld 100 (*)    Total Bilirubin 1.3 (*)    All other components within normal limits  CBC - Abnormal; Notable for the following components:   Hemoglobin 15.3 (*)    All other components within normal limits  URINALYSIS, COMPLETE (UACMP) WITH MICROSCOPIC - Abnormal; Notable for the following components:   Color, Urine YELLOW (*)    APPearance HAZY (*)    Ketones, ur 20 (*)    Bacteria, UA RARE (*)     All other components within normal limits  LIPASE, BLOOD  PREGNANCY, URINE   ____________________________________________   EKG  Interpreted by me Normal sinus rhythm rate of 89, normal axis and intervals.  Normal QRS ST segments and T waves.  Overall normal EKG.  ____________________________________________    RADIOLOGY  Ct Abdomen Pelvis W Contrast  Result Date: 06/22/2019 CLINICAL DATA:  Epigastric pain and nausea for the past 3 days. EXAM: CT ABDOMEN AND PELVIS WITH CONTRAST TECHNIQUE: Multidetector CT imaging of the abdomen and pelvis was performed using the standard protocol following bolus administration of intravenous contrast. CONTRAST:  100mL OMNIPAQUE IOHEXOL 300 MG/ML  SOLN COMPARISON:  Right upper quadrant ultrasound from  same day. FINDINGS: Lower chest: No acute abnormality. Hepatobiliary: No focal liver abnormality is seen. No gallstones, gallbladder wall thickening, or biliary dilatation. Pancreas: Unremarkable. No pancreatic ductal dilatation or surrounding inflammatory changes. Spleen: Normal in size without focal abnormality. Adrenals/Urinary Tract: The adrenal glands are unremarkable. There are several punctate right renal calculi. There are multiple mildly complex cyst in the left kidney with internal septations measuring up to 2 cm. There are also several low to intermediate density lesions in the left kidney measuring up to 3.0 cm that are incompletely characterized. No hydronephrosis. The bladder is unremarkable for the degree of distention. Stomach/Bowel: Stomach is within normal limits. Appendix appears normal. No evidence of bowel wall thickening, distention, or inflammatory changes. Vascular/Lymphatic: No significant vascular findings are present. No enlarged abdominal or pelvic lymph nodes. Reproductive: The uterus and left ovary are unremarkable. 3.4 cm simple cyst in the right ovary. Other: Small fat containing paraumbilical hernia. No free fluid or pneumoperitoneum.  Musculoskeletal: No acute or significant osseous findings. IMPRESSION: 1.  No acute intra-abdominal process. 2. Several complex cysts and indeterminate lesions in the left kidney. Recommend non-emergent renal protocol MRI or CT with and without contrast for further evaluation. 3. Nonobstructive punctate right nephrolithiasis. Electronically Signed   By: Obie DredgeWilliam T Derry M.D.   On: 06/22/2019 17:38   [place] Abdomen Limited Ruq  Result Date: 06/22/2019 CLINICAL DATA:  Right upper quadrant pain and nausea. EXAM: ULTRASOUND ABDOMEN LIMITED RIGHT UPPER QUADRANT COMPARISON:  None. FINDINGS: Gallbladder: No gallstones or wall thickening visualized. No sonographic Murphy sign noted by sonographer. Common bile duct: Diameter: 3 mm Liver: Heterogeneously increased parenchymal echogenicity diffusely without a a focal lesion identified. Portal vein is patent on color Doppler imaging with normal direction of blood flow towards the liver. IMPRESSION: 1. Echogenic liver which may reflect steatosis. 2. Unremarkable gallbladder.  No biliary dilatation. Electronically Signed   By: Sebastian AcheAllen  Grady M.D.   On: 06/22/2019 16:21    ____________________________________________   PROCEDURES Procedures  ____________________________________________  DIFFERENTIAL DIAGNOSIS   Pancreatitis, biliary colic, choledocholithiasis, cholecystitis, appendicitis, colitis  CLINICAL IMPRESSION / ASSESSMENT AND PLAN / ED COURSE  Medications ordered in the ED: Medications  ondansetron (ZOFRAN-ODT) disintegrating tablet 4 mg (has no administration in time range)  sodium chloride 0.9 % bolus 1,000 mL (1,000 mLs Intravenous New Bag/Given 06/22/19 1602)  ondansetron (ZOFRAN) injection 4 mg (4 mg Intravenous Given 06/22/19 1601)  ketorolac (TORADOL) 30 MG/ML injection 15 mg (15 mg Intravenous Given 06/22/19 1601)  iohexol (OMNIPAQUE) 300 MG/ML solution 100 mL (100 mLs Intravenous Contrast Given 06/22/19 1656)    Pertinent labs & imaging results  that were available during my care of the patient were reviewed by me and considered in my medical decision making (see chart for details).  Barbara Cole was evaluated in Emergency Department on 06/22/2019 for the symptoms described in the history of present illness. She was evaluated in the context of the global COVID-19 pandemic, which necessitated consideration that the patient might be at risk for infection with the SARS-CoV-2 virus that causes COVID-19. Institutional protocols and algorithms that pertain to the evaluation of patients at risk for COVID-19 are in a state of rapid change based on information released by regulatory bodies including the CDC and federal and state organizations. These policies and algorithms were followed during the patient's care in the ED.   Patient presents with persistent upper abdominal pain for the past 5 days.  This is her third ED visit in that time.  She  has substantial tenderness on exam, worrisome for biliary disease primarily.  I will give IV Toradol Zofran and IV fluids for rehydration, obtain ultrasound of the right upper quadrant.  If ultrasound is negative I think at this point it will be necessary to obtain a CT scan of the abdomen and pelvis given her refractory symptoms that have so far been clinically elusive.  Clinical Course as of Jun 21 1818  Tue Jun 22, 2019  1802 Ultrasound right upper quadrant negative, CT scan unremarkable.  Vital signs remain normal.  At this point is unlikely that she has any significant intra-abdominal pathology, and this appears to be GERD/gastritis with severe symptoms.  I will put her on an aggressive regimen of antacids, again encouraged her to follow-up with gastroenterology.   [PS]    Clinical Course User Index [PS] Sharman CheekStafford, Elizer Bostic, MD     ----------------------------------------- 6:18 PM on 06/22/2019 -----------------------------------------  She reports taking Maalox Carafate and Protonix.  I will add  on a week of Reglan and famotidine.  She is on a normal diet, low risk for developing pernicious anemia with a short course of PPI and H2 blockers concurrently.  She will continue following the bland diet her doctor has recommended and follow-up with GI.  They did a H. pylori test yesterday which I am not able to see a result from but she is anticipating a call back from them with that result any day now.  Vital signs remain normal.  Stable for discharge home.  ____________________________________________   FINAL CLINICAL IMPRESSION(S) / ED DIAGNOSES    Final diagnoses:  Acute gastritis without hemorrhage, unspecified gastritis type  Pain of upper abdomen     ED Discharge Orders         Ordered    famotidine (PEPCID) 20 MG tablet  2 times daily     06/22/19 1818    metoCLOPramide (REGLAN) 10 MG tablet  Every 6 hours PRN     06/22/19 1818          Portions of this note were generated with dragon dictation software. Dictation errors may occur despite best attempts at proofreading.   Sharman CheekStafford, Suleiman Finigan, MD 06/22/19 Alison [id number]    Sharman CheekStafford, Oaklen Thiam, MD 07/08/19 [phone]

## 2019-06-24 DIAGNOSIS — K219 Gastro-esophageal reflux disease without esophagitis: Secondary | ICD-10-CM | POA: Diagnosis not present

## 2019-06-24 DIAGNOSIS — R1013 Epigastric pain: Secondary | ICD-10-CM | POA: Diagnosis not present

## 2019-06-24 DIAGNOSIS — R110 Nausea: Secondary | ICD-10-CM | POA: Diagnosis not present

## 2019-06-30 ENCOUNTER — Other Ambulatory Visit: Payer: Self-pay | Admitting: Gastroenterology

## 2019-06-30 DIAGNOSIS — R935 Abnormal findings on diagnostic imaging of other abdominal regions, including retroperitoneum: Secondary | ICD-10-CM | POA: Diagnosis not present

## 2019-06-30 DIAGNOSIS — N281 Cyst of kidney, acquired: Secondary | ICD-10-CM | POA: Diagnosis not present

## 2019-06-30 DIAGNOSIS — R0989 Other specified symptoms and signs involving the circulatory and respiratory systems: Secondary | ICD-10-CM

## 2019-06-30 DIAGNOSIS — R1013 Epigastric pain: Secondary | ICD-10-CM | POA: Diagnosis not present

## 2019-07-06 ENCOUNTER — Ambulatory Visit
Admission: RE | Admit: 2019-07-06 | Discharge: 2019-07-06 | Disposition: A | Discharge status: Home / Self Care | Payer: 59 | Source: Ambulatory Visit | Attending: Gastroenterology | Admitting: Gastroenterology

## 2019-07-06 ENCOUNTER — Other Ambulatory Visit: Payer: Self-pay

## 2019-07-06 DIAGNOSIS — R0989 Other specified symptoms and signs involving the circulatory and respiratory systems: Secondary | ICD-10-CM | POA: Diagnosis not present

## 2019-07-06 DIAGNOSIS — R140 Abdominal distension (gaseous): Secondary | ICD-10-CM | POA: Diagnosis not present

## 2019-07-08 ENCOUNTER — Other Ambulatory Visit
Admission: RE | Admit: 2019-07-08 | Discharge: 2019-07-08 | Disposition: A | Discharge status: Home / Self Care | Payer: 59 | Source: Ambulatory Visit | Attending: Gastroenterology | Admitting: Gastroenterology

## 2019-07-08 ENCOUNTER — Other Ambulatory Visit: Payer: Self-pay

## 2019-07-08 DIAGNOSIS — Z1159 Encounter for screening for other viral diseases: Secondary | ICD-10-CM | POA: Insufficient documentation

## 2019-07-08 DIAGNOSIS — Z01812 Encounter for preprocedural laboratory examination: Secondary | ICD-10-CM | POA: Diagnosis not present

## 2019-07-09 ENCOUNTER — Encounter: Payer: Self-pay | Admitting: *Deleted

## 2019-07-09 LAB — SARS CORONAVIRUS 2 (TAT 6-24 HRS): SARS Coronavirus 2: NEGATIVE

## 2019-07-12 ENCOUNTER — Ambulatory Visit: Payer: 59 | Admitting: Certified Registered"

## 2019-07-12 ENCOUNTER — Encounter
Admission: RE | Disposition: A | Discharge status: Home / Self Care | Payer: Self-pay | Source: Home / Self Care | Attending: Gastroenterology

## 2019-07-12 ENCOUNTER — Other Ambulatory Visit: Payer: Self-pay

## 2019-07-12 ENCOUNTER — Ambulatory Visit
Admission: RE | Admit: 2019-07-12 | Discharge: 2019-07-12 | Disposition: A | Discharge status: Home / Self Care | Payer: 59 | Attending: Gastroenterology | Admitting: Gastroenterology

## 2019-07-12 DIAGNOSIS — K2960 Other gastritis without bleeding: Secondary | ICD-10-CM | POA: Diagnosis not present

## 2019-07-12 DIAGNOSIS — K210 Gastro-esophageal reflux disease with esophagitis: Secondary | ICD-10-CM | POA: Diagnosis not present

## 2019-07-12 DIAGNOSIS — Z79899 Other long term (current) drug therapy: Secondary | ICD-10-CM | POA: Insufficient documentation

## 2019-07-12 DIAGNOSIS — K319 Disease of stomach and duodenum, unspecified: Secondary | ICD-10-CM | POA: Insufficient documentation

## 2019-07-12 DIAGNOSIS — K259 Gastric ulcer, unspecified as acute or chronic, without hemorrhage or perforation: Secondary | ICD-10-CM | POA: Diagnosis not present

## 2019-07-12 DIAGNOSIS — R1011 Right upper quadrant pain: Secondary | ICD-10-CM | POA: Diagnosis not present

## 2019-07-12 DIAGNOSIS — R1013 Epigastric pain: Secondary | ICD-10-CM | POA: Insufficient documentation

## 2019-07-12 DIAGNOSIS — K3189 Other diseases of stomach and duodenum: Secondary | ICD-10-CM | POA: Diagnosis not present

## 2019-07-12 DIAGNOSIS — F419 Anxiety disorder, unspecified: Secondary | ICD-10-CM | POA: Insufficient documentation

## 2019-07-12 DIAGNOSIS — K228 Other specified diseases of esophagus: Secondary | ICD-10-CM | POA: Diagnosis not present

## 2019-07-12 DIAGNOSIS — K2900 Acute gastritis without bleeding: Secondary | ICD-10-CM | POA: Diagnosis not present

## 2019-07-12 HISTORY — PX: ESOPHAGOGASTRODUODENOSCOPY (EGD) WITH PROPOFOL: SHX5813

## 2019-07-12 LAB — POCT PREGNANCY, URINE: Preg Test, Ur: NEGATIVE

## 2019-07-12 SURGERY — ESOPHAGOGASTRODUODENOSCOPY (EGD) WITH PROPOFOL
Anesthesia: General

## 2019-07-12 MED ORDER — GLYCOPYRROLATE 0.2 MG/ML IJ SOLN
INTRAMUSCULAR | Status: AC
  Filled 2019-07-12: qty 1

## 2019-07-12 MED ORDER — PROPOFOL 500 MG/50ML IV EMUL
INTRAVENOUS | Status: DC | PRN
  Administered 2019-07-12: 185 ug/kg/min via INTRAVENOUS

## 2019-07-12 MED ORDER — PROPOFOL 500 MG/50ML IV EMUL
INTRAVENOUS | Status: AC
  Filled 2019-07-12: qty 50

## 2019-07-12 MED ORDER — SODIUM CHLORIDE 0.9 % IV SOLN
INTRAVENOUS | Status: DC
  Administered 2019-07-12: 12:00:00 via INTRAVENOUS

## 2019-07-12 MED ORDER — FENTANYL CITRATE (PF) 100 MCG/2ML IJ SOLN
INTRAMUSCULAR | Status: DC | PRN
  Administered 2019-07-12 (×2): 25 ug via INTRAVENOUS

## 2019-07-12 MED ORDER — PROPOFOL 10 MG/ML IV BOLUS
INTRAVENOUS | Status: DC | PRN
  Administered 2019-07-12: 40 mg via INTRAVENOUS
  Administered 2019-07-12: 30 mg via INTRAVENOUS
  Administered 2019-07-12: 70 mg via INTRAVENOUS

## 2019-07-12 MED ORDER — FENTANYL CITRATE (PF) 100 MCG/2ML IJ SOLN
INTRAMUSCULAR | Status: AC
  Filled 2019-07-12: qty 2

## 2019-07-12 NOTE — Transfer of Care (Signed)
Immediate Anesthesia Transfer of Care Note  Patient: Barbara Cole  Procedure(s) Performed: ESOPHAGOGASTRODUODENOSCOPY (EGD) WITH PROPOFOL (N/A )  Patient Location: PACU  Anesthesia Type:General  Level of Consciousness: drowsy  Airway & Oxygen Therapy: Patient Spontanous Breathing and Patient connected to nasal cannula oxygen  Post-op Assessment: Report given to RN and Post -op Vital signs reviewed and stable  Post vital signs: Reviewed and stable  Last Vitals:  Vitals Value Taken Time  BP    Temp    Pulse 93 07/12/19 1302  Resp 19 07/12/19 1302  SpO2 100 % 07/12/19 1302  Vitals shown include unvalidated device data.  Last Pain:  Vitals:   07/12/19 1256  TempSrc: Tympanic  PainSc:          Complications: No apparent anesthesia complications

## 2019-07-12 NOTE — Anesthesia Post-op Follow-up Note (Signed)
Anesthesia QCDR form completed.        

## 2019-07-12 NOTE — H&P (Signed)
Outpatient short stay form Pre-procedure 07/12/2019 12:14 PM Barbara DeemMartin U Barbara Eakins MD  Primary Physician: Dr. Dorothey Basemanavid Bronstein  Reason for visit: EGD  History of present illness: Patient is a 28 year old female presenting today with complaint of epigastric pain and dyspepsia.  Also occasionally has a globus type sensation.  States that she woke up acutely about 3 weeks ago with an episode of epigastric pain that radiated to her upper right back.  He did have an abdominal ultrasound is as well as a CT scan at the emergency room.  The ultrasound showed a echogenic liver liver likely steatosis but unremarkable gallbladder and no evidence of biliary dilatation.  Her CT scan showed no acute intra-abdominal process she has several complex cysts and indeterminate lesions in the left kidney and further recommendation was recommended in that regard.  Also has some nonobstructive obstructive right nephro lithiasis.  She denies use of any aspirin or NSAID products within the past 6 months or so.  She has had no rapid weight loss but has lost some weight since these episodes began 3 weeks ago.  It is associated with nausea but no vomiting.  Of note patient had a upper GI series done because of complaint of some dysphagia.  This showed a normal study.    Current Facility-Administered Medications:  .  0.9 %  sodium chloride infusion, , Intravenous, Continuous, Barbara DeemSkulskie, Belen Pesch U, MD  Medications Prior to Admission  Medication Sig Dispense Refill Last Dose  . ALPRAZolam (XANAX XR) 0.5 MG 24 hr tablet Take 0.5 mg by mouth daily.   Past Week at Unknown time  . alum & mag hydroxide-simeth (MAALOX MAX) 400-400-40 MG/5ML suspension Take 10 mLs by mouth every 6 (six) hours as needed for indigestion. 355 mL 0 Past Week at Unknown time  . buPROPion (WELLBUTRIN) 100 MG tablet Take 150 mg by mouth every morning.     . metoCLOPramide (REGLAN) 10 MG tablet Take 1 tablet (10 mg total) by mouth every 6 (six) hours as needed. 30  tablet 0 Past Month at Unknown time  . ondansetron (ZOFRAN ODT) 4 MG disintegrating tablet Take 1 tablet (4 mg total) by mouth every 8 (eight) hours as needed for nausea. 20 tablet 0 Past Month at Unknown time  . famotidine (PEPCID) 20 MG tablet Take 1 tablet (20 mg total) by mouth 2 (two) times daily. (Patient not taking: Reported on 07/12/2019) 60 tablet 0 Not Taking at Unknown time  . pantoprazole (PROTONIX) 20 MG tablet Take 1 tablet (20 mg total) by mouth 2 (two) times daily. (Patient not taking: Reported on 07/12/2019) 60 tablet 0 Not Taking at Unknown time  . sertraline (ZOLOFT) 25 MG tablet Take 25 mg by mouth daily.   Not Taking at Unknown time  . sucralfate (CARAFATE) 1 g tablet Take 1 tablet (1 g total) by mouth 4 (four) times daily -  with meals and at bedtime. 120 tablet 0      No Known Allergies   Past Medical History:  Diagnosis Date  . Anxiety     Review of systems:      Physical Exam    Heart and lungs: Regular rate and rhythm without rub or gallop lungs are bilaterally clear    HEENT: Normocephalic atraumatic eyes are anicteric    Other:    Pertinant exam for procedure: Soft mild discomfort to palpation the right upper quadrant.  There are no masses or rebound.  Bowel sounds positive normoactive    Planned proceedures: EGD and  indicated procedures. I have discussed the risks benefits and complications of procedures to include not limited to bleeding, infection, perforation and the risk of sedation and the patient wishes to proceed.    Lollie Sails, MD Gastroenterology 07/12/2019  12:14 PM

## 2019-07-12 NOTE — Anesthesia Preprocedure Evaluation (Signed)
Anesthesia Evaluation  Patient identified by MRN, date of birth, ID band Patient awake    Reviewed: Allergy & Precautions, NPO status , Patient's Chart, lab work & pertinent test results  History of Anesthesia Complications Negative for: history of anesthetic complications  Airway Mallampati: II  TM Distance: >3 FB Neck ROM: Full    Dental no notable dental hx.    Pulmonary neg pulmonary ROS, neg sleep apnea, neg COPD,    breath sounds clear to auscultation- rhonchi (-) wheezing      Cardiovascular Exercise Tolerance: Good (-) hypertension(-) CAD and (-) Past MI  Rhythm:Regular Rate:Normal - Systolic murmurs and - Diastolic murmurs    Neuro/Psych PSYCHIATRIC DISORDERS Anxiety negative neurological ROS     GI/Hepatic negative GI ROS, Neg liver ROS,   Endo/Other  negative endocrine ROSneg diabetes  Renal/GU negative Renal ROS     Musculoskeletal negative musculoskeletal ROS (+)   Abdominal (+) + obese,   Peds  Hematology negative hematology ROS (+)   Anesthesia Other Findings Past Medical History: No date: Anxiety   Reproductive/Obstetrics                             Anesthesia Physical Anesthesia Plan  ASA: II  Anesthesia Plan: General   Post-op Pain Management:    Induction: Intravenous  PONV Risk Score and Plan: 2 and Propofol infusion  Airway Management Planned: Natural Airway  Additional Equipment:   Intra-op Plan:   Post-operative Plan:   Informed Consent: I have reviewed the patients History and Physical, chart, labs and discussed the procedure including the risks, benefits and alternatives for the proposed anesthesia with the patient or authorized representative who has indicated his/her understanding and acceptance.     Dental advisory given  Plan Discussed with: CRNA and Anesthesiologist  Anesthesia Plan Comments:         Anesthesia Quick  Evaluation

## 2019-07-12 NOTE — Anesthesia Postprocedure Evaluation (Signed)
Anesthesia Post Note  Patient: JANAH MCCULLOH  Procedure(s) Performed: ESOPHAGOGASTRODUODENOSCOPY (EGD) WITH PROPOFOL (N/A )  Patient location during evaluation: PACU Anesthesia Type: General Level of consciousness: awake and alert and oriented Pain management: pain level controlled Vital Signs Assessment: post-procedure vital signs reviewed and stable Respiratory status: spontaneous breathing, nonlabored ventilation and respiratory function stable Cardiovascular status: blood pressure returned to baseline and stable Postop Assessment: no signs of nausea or vomiting Anesthetic complications: no     Last Vitals:  Vitals:   07/12/19 1316 07/12/19 1326  BP: 109/67 105/64  Pulse: 76 64  Resp: 16 15  Temp:    SpO2: 100% 99%    Last Pain:  Vitals:   07/12/19 1326  TempSrc:   PainSc: 0-No pain                 Gracieann Stannard

## 2019-07-12 NOTE — Op Note (Signed)
[facility] Gastroenterology Patient Name: Barbara HindersMakenzie Cole Procedure Date: 07/12/2019 12:23 PM MRN: [id number] Account #: [id number] Date of Birth: 12-Jul-1991 Admit Type: Outpatient Age: 89 [facility] 3 Gender: Female Note Status: Finalized Procedure:            Upper GI endoscopy Indications:          Epigastric abdominal pain, Abdominal pain in the right                        upper quadrant, Dyspepsia Providers:            Christena DeemMartin U. Jordyn Doane, MD Referring MD:         Cline CoolsBethany E. Beasley (Referring MD) Medicines:            Monitored Anesthesia Care Complications:        No immediate complications. Procedure:            Pre-Anesthesia Assessment:                       - ASA Grade Assessment: II - A patient with mild                        systemic disease.                       After obtaining informed consent, the endoscope was                        passed under direct vision. Throughout the procedure,                        the patient's blood pressure, pulse, and oxygen                        saturations were monitored continuously. The Endoscope                        was introduced through the mouth, and advanced to the                        third part of duodenum. The upper GI endoscopy was                        accomplished without difficulty. The patient tolerated                        the procedure well. Findings:      The Z-line was variable. Biopsies were taken with a cold forceps for       histology.      Patchy moderate inflammation characterized by congestion (edema),       erosions, erythema, serpentine ulcerations and shallow ulcerations was       found at the incisura, in the gastric antrum and in the prepyloric       region of the stomach. Biopsies were taken with a cold forceps for       histology.      The examined duodenum was normal.      Biopsies were taken with a cold forceps in the gastric body for       histology.   The cardia and gastric fundus were normal on  retroflexion otherwise.      The exam of the esophagus was otherwise normal. Impression:           - Z-line variable. Biopsied.                       - Erosive gastritis. Biopsied.                       - Normal examined duodenum.                       - Biopsies were taken with a cold forceps for histology                        in the gastric body. Recommendation:       - Discharge patient to home.                       - Use Aciphex (rabeprazole) 20 mg PO BID for 1 month.                       - Use Aciphex (rabeprazole) 20 mg PO daily daily. Procedure Code(s):    --- Professional ---                       [id number], Esophagogastroduodenoscopy, flexible, transoral;                        with biopsy, single or multiple CPT copyright 2019 American Medical Association. All rights reserved. The codes documented in this report are preliminary and upon coder review may  be revised to meet current compliance requirements. Lollie Sails, MD 07/12/2019 12:58:14 PM This report has been signed electronically. Number of Addenda: 0 Note Initiated On: 07/12/2019 12:23 PM      [facility]

## 2019-07-13 ENCOUNTER — Encounter: Payer: Self-pay | Admitting: Gastroenterology

## 2019-07-14 ENCOUNTER — Other Ambulatory Visit: Payer: Self-pay | Admitting: Gastroenterology

## 2019-07-14 ENCOUNTER — Encounter (HOSPITAL_BASED_OUTPATIENT_CLINIC_OR_DEPARTMENT_OTHER): Payer: Self-pay

## 2019-07-14 ENCOUNTER — Other Ambulatory Visit: Payer: Self-pay

## 2019-07-14 ENCOUNTER — Emergency Department (HOSPITAL_BASED_OUTPATIENT_CLINIC_OR_DEPARTMENT_OTHER)
Admission: EM | Admit: 2019-07-14 | Discharge: 2019-07-14 | Disposition: A | Discharge status: Home / Self Care | Payer: 59 | Attending: Emergency Medicine | Admitting: Emergency Medicine

## 2019-07-14 DIAGNOSIS — R110 Nausea: Secondary | ICD-10-CM | POA: Diagnosis not present

## 2019-07-14 DIAGNOSIS — Z3202 Encounter for pregnancy test, result negative: Secondary | ICD-10-CM | POA: Diagnosis not present

## 2019-07-14 DIAGNOSIS — R1011 Right upper quadrant pain: Secondary | ICD-10-CM | POA: Insufficient documentation

## 2019-07-14 DIAGNOSIS — R1013 Epigastric pain: Secondary | ICD-10-CM

## 2019-07-14 DIAGNOSIS — R1010 Upper abdominal pain, unspecified: Secondary | ICD-10-CM | POA: Diagnosis present

## 2019-07-14 DIAGNOSIS — Z79899 Other long term (current) drug therapy: Secondary | ICD-10-CM | POA: Diagnosis not present

## 2019-07-14 LAB — CBC WITH DIFFERENTIAL/PLATELET
Abs Immature Granulocytes: 0.02 10*3/uL (ref 0.00–0.07)
Basophils Absolute: 0 10*3/uL (ref 0.0–0.1)
Basophils Relative: 0 %
Eosinophils Absolute: 0.1 10*3/uL (ref 0.0–0.5)
Eosinophils Relative: 1 %
HCT: 40.1 % (ref 36.0–46.0)
Hemoglobin: 13.3 g/dL (ref 12.0–15.0)
Immature Granulocytes: 0 %
Lymphocytes Relative: 25 %
Lymphs Abs: 2.2 10*3/uL (ref 0.7–4.0)
MCH: 30.3 pg (ref 26.0–34.0)
MCHC: 33.2 g/dL (ref 30.0–36.0)
MCV: 91.3 fL (ref 80.0–100.0)
Monocytes Absolute: 0.7 10*3/uL (ref 0.1–1.0)
Monocytes Relative: 9 %
Neutro Abs: 5.4 10*3/uL (ref 1.7–7.7)
Neutrophils Relative %: 65 %
Platelets: 245 10*3/uL (ref 150–400)
RBC: 4.39 MIL/uL (ref 3.87–5.11)
RDW: 11.9 % (ref 11.5–15.5)
WBC: 8.5 10*3/uL (ref 4.0–10.5)
nRBC: 0 % (ref 0.0–0.2)

## 2019-07-14 LAB — COMPREHENSIVE METABOLIC PANEL
ALT: 16 U/L (ref 0–44)
AST: 16 U/L (ref 15–41)
Albumin: 4.2 g/dL (ref 3.5–5.0)
Alkaline Phosphatase: 44 U/L (ref 38–126)
Anion gap: 11 (ref 5–15)
BUN: 14 mg/dL (ref 6–20)
CO2: 21 mmol/L — ABNORMAL LOW (ref 22–32)
Calcium: 9.1 mg/dL (ref 8.9–10.3)
Chloride: 106 mmol/L (ref 98–111)
Creatinine, Ser: 0.89 mg/dL (ref 0.44–1.00)
GFR calc Af Amer: >60 mL/min (ref >60)
GFR calc non Af Amer: >60 mL/min (ref >60)
Glucose, Bld: 89 mg/dL (ref 70–99)
Potassium: 3.6 mmol/L (ref 3.5–5.1)
Sodium: 138 mmol/L (ref 135–145)
Total Bilirubin: 1.1 mg/dL (ref 0.3–1.2)
Total Protein: 7 g/dL (ref 6.5–8.1)

## 2019-07-14 LAB — URINALYSIS, ROUTINE W REFLEX MICROSCOPIC
Bilirubin Urine: NEGATIVE
Glucose, UA: NEGATIVE mg/dL
Hgb urine dipstick: NEGATIVE
Ketones, ur: 15 mg/dL — AB
Leukocytes,Ua: NEGATIVE
Nitrite: NEGATIVE
Protein, ur: NEGATIVE mg/dL
Specific Gravity, Urine: >1.03 — ABNORMAL HIGH (ref 1.005–1.030)
pH: 5.5 (ref 5.0–8.0)

## 2019-07-14 LAB — SURGICAL PATHOLOGY

## 2019-07-14 LAB — PREGNANCY, URINE: Preg Test, Ur: NEGATIVE

## 2019-07-14 LAB — LIPASE, BLOOD: Lipase: 34 U/L (ref 11–51)

## 2019-07-14 MED ORDER — GLYCOPYRROLATE 1 MG PO TABS: 1.0000 mg | ORAL_TABLET | Freq: Three times a day (TID) | ORAL | 0 refills | Status: DC | PRN

## 2019-07-14 MED ORDER — ONDANSETRON HCL 4 MG/2ML IJ SOLN
4.0000 mg | Freq: Once | INTRAMUSCULAR | Status: AC
  Administered 2019-07-14: 4 mg via INTRAVENOUS
  Filled 2019-07-14: qty 2

## 2019-07-14 NOTE — ED Notes (Signed)
ED Provider at bedside. 

## 2019-07-14 NOTE — Discharge Instructions (Addendum)
Take the medication to see if it helps with your abdominal pain.  It may cause a dry mouth.  Follow-up with your doctor as planned to have your HIDA test.  Called to see if they can move up the date of your test.

## 2019-07-14 NOTE — ED Provider Notes (Signed)
[facility] EMERGENCY DEPARTMENT Provider Note   CSN: [id number] Arrival date & time: 07/14/19  1909    History   Chief Complaint Chief Complaint  Patient presents with  . Abdominal Pain    HPI Barbara Cole is a 28 y.o. female.     HPI Pt has been having pain that goes from her lower chest to upper abdomen and it radiates to her should blade.  It started last evening.  She gets nauseated.  Eating triggers episodes of pain.  No vomiting.  No dysuria.  Pt has been having this start off and on for 4 weeks.  She had a CT scan and us previously.  She had an endoscopy and was told she had gastritis.   Past Medical History:  Diagnosis Date  . Anxiety     There are no active problems to display for this patient.   Past Surgical History:  Procedure Laterality Date  . ESOPHAGOGASTRODUODENOSCOPY (EGD) WITH PROPOFOL N/A 07/12/2019   Procedure: ESOPHAGOGASTRODUODENOSCOPY (EGD) WITH PROPOFOL;  Surgeon: Christena DeemSkulskie, Martin U, MD;  [facility];  Service: Endoscopy;  Laterality: N/A;  . WISDOM TOOTH EXTRACTION       OB History    Gravida  1   Para  1   Term      Preterm      AB      Living        SAB      TAB      Ectopic      Multiple      Live Births               Home Medications    Prior to Admission medications   Medication Sig Start Date End Date Taking? Authorizing Provider  ALPRAZolam (XANAX XR) 0.5 MG 24 hr tablet Take 0.5 mg by mouth daily.    [provider]  alum & mag hydroxide-simeth (MAALOX MAX) 400-400-40 MG/5ML suspension Take 10 mLs by mouth every 6 (six) hours as needed for indigestion. 06/19/19   Long, Arlyss RepressJoshua G, MD  buPROPion (WELLBUTRIN) 100 MG tablet Take 150 mg by mouth every morning.    [provider]  famotidine (PEPCID) 20 MG tablet Take 1 tablet (20 mg total) by mouth 2 (two) times daily. Patient not taking: Reported on 07/12/2019 06/22/19   Sharman CheekStafford, Phillip, MD  glycopyrrolate (ROBINUL) 1  MG tablet Take 1 tablet (1 mg total) by mouth 3 (three) times daily as needed (abd pain). 07/14/19   Linwood DibblesKnapp, Sajad Glander, MD  metoCLOPramide (REGLAN) 10 MG tablet Take 1 tablet (10 mg total) by mouth every 6 (six) hours as needed. 06/22/19   Sharman CheekStafford, Phillip, MD  ondansetron (ZOFRAN ODT) 4 MG disintegrating tablet Take 1 tablet (4 mg total) by mouth every 8 (eight) hours as needed for nausea. 06/19/19   Long, Arlyss RepressJoshua G, MD  pantoprazole (PROTONIX) 20 MG tablet Take 1 tablet (20 mg total) by mouth 2 (two) times daily. Patient not taking: Reported on 07/12/2019 06/18/19   Ward, Chase PicketJaime Pilcher, PA-C  sertraline (ZOLOFT) 25 MG tablet Take 25 mg by mouth daily.    [provider]  sucralfate (CARAFATE) 1 g tablet Take 1 tablet (1 g total) by mouth 4 (four) times daily -  with meals and at bedtime. 06/18/19   Ward, Chase PicketJaime Pilcher, PA-C    Family History No family history on file.  Social History Social History   Tobacco Use  . Smoking status: Never Smoker  . Smokeless  tobacco: Never Used  Substance Use Topics  . Alcohol use: Never    Frequency: Never  . Drug use: Never     Allergies   Patient has no known allergies.   Review of Systems Review of Systems  All other systems reviewed and are negative.    Physical Exam Updated Vital Signs BP 126/78 (BP Location: Left Arm)   Pulse 83   Temp 98.5 F (36.9 C) (Oral)   Resp 18   Ht 1.575 m (5\' 2" )   Wt 80.3 kg   LMP 06/17/2019 Comment: neg upt  SpO2 100%   BMI 32.37 kg/m   Physical Exam Vitals signs and nursing note reviewed.  Constitutional:      General: She is not in acute distress.    Appearance: She is well-developed.  HENT:     Head: Normocephalic and atraumatic.     Right Ear: External ear normal.     Left Ear: External ear normal.  Eyes:     General: No scleral icterus.       Right eye: No discharge.        Left eye: No discharge.     Conjunctiva/sclera: Conjunctivae normal.  Neck:     Musculoskeletal: Neck supple.      Trachea: No tracheal deviation.  Cardiovascular:     Rate and Rhythm: Normal rate and regular rhythm.  Pulmonary:     Effort: Pulmonary effort is normal. No respiratory distress.     Breath sounds: Normal breath sounds. No stridor. No wheezing or rales.  Abdominal:     General: Bowel sounds are normal. There is no distension.     Palpations: Abdomen is soft.     Tenderness: There is no abdominal tenderness. There is no guarding or rebound.  Musculoskeletal:        General: No tenderness.  Skin:    General: Skin is warm and dry.     Findings: No rash.  Neurological:     Mental Status: She is alert.     Cranial Nerves: No cranial nerve deficit (no facial droop, extraocular movements intact, no slurred speech).     Sensory: No sensory deficit.     Motor: No abnormal muscle tone or seizure activity.     Coordination: Coordination normal.      ED Treatments / Results  Labs (all labs ordered are listed, but only abnormal results are displayed) Labs Reviewed  COMPREHENSIVE METABOLIC PANEL - Abnormal; Notable for the following components:      Result Value   CO2 21 (*)    All other components within normal limits  URINALYSIS, ROUTINE W REFLEX MICROSCOPIC - Abnormal; Notable for the following components:   Specific Gravity, Urine >1.030 (*)    Ketones, ur 15 (*)    All other components within normal limits  LIPASE, BLOOD  CBC WITH DIFFERENTIAL/PLATELET  PREGNANCY, URINE    EKG EKG Interpretation  Date/Time:  Wednesday July 14 2019 21:25:21 EDT Ventricular Rate:  72 PR Interval:    QRS Duration: 96 QT Interval:  394 QTC Calculation: 432 R Axis:   69 Text Interpretation:  Sinus rhythm No old tracing to compare Confirmed by Dorie Rank [phone]) on 07/14/2019 9:38:48 PM   Radiology No results found.  Procedures Procedures (including critical care time)  Medications Ordered in ED Medications  ondansetron (ZOFRAN) injection 4 mg (4 mg Intravenous Given 07/14/19 2116)      Initial Impression / Assessment and Plan / ED Course  I have reviewed  the triage vital signs and the nursing notes.  Pertinent labs & imaging results that were available during my care of the patient were reviewed by me and considered in my medical decision making (see chart for details).   Patient presented to the ED for evaluation of recurrent abdominal pain.  Patient is already had outpatient ultrasounds and CT scans.  It sounds like she is going to have a HIDA scan.  Her symptoms do certainly suggests biliary colic.  It is possible that she has gallbladder dysfunction.  Her laboratory tests are unremarkable.  I do not think that repeat imaging is necessary at this time.  I will have her try taking Robinul to see if that helps with her symptoms.  Discussed having her contact her doctor to see if they can possibly move up her HIDA scan.  Final Clinical Impressions(s) / ED Diagnoses   Final diagnoses:  Right upper quadrant abdominal pain    ED Discharge Orders         Ordered    glycopyrrolate (ROBINUL) 1 MG tablet  3 times daily PRN     07/14/19 2257           Linwood DibblesKnapp, Emanuelle Bastos, MD 07/14/19 2259

## 2019-07-14 NOTE — ED Triage Notes (Addendum)
Pt c/o epigastric pain-hx of same with upper GI series 7/13 and GB scan to be done 7/22-pt NAD-steady gait

## 2019-07-15 ENCOUNTER — Ambulatory Visit: Admission: RE | Admit: 2019-07-15 | Payer: 59 | Source: Ambulatory Visit

## 2019-07-15 MED FILL — GLYCOPYRROLATE 1 MG TABS: 1 | 7 days supply | Qty: 21 | Fill #0

## 2019-07-16 ENCOUNTER — Other Ambulatory Visit: Payer: Self-pay

## 2019-07-16 ENCOUNTER — Encounter
Admission: RE | Admit: 2019-07-16 | Discharge: 2019-07-16 | Disposition: A | Discharge status: Home / Self Care | Payer: 59 | Source: Ambulatory Visit | Attending: Gastroenterology | Admitting: Gastroenterology

## 2019-07-16 DIAGNOSIS — R1011 Right upper quadrant pain: Secondary | ICD-10-CM | POA: Diagnosis not present

## 2019-07-16 DIAGNOSIS — R1013 Epigastric pain: Secondary | ICD-10-CM | POA: Diagnosis not present

## 2019-07-16 MED ORDER — TECHNETIUM TC 99M MEBROFENIN IV KIT
5.0000 | PACK | Freq: Once | INTRAVENOUS | Status: AC | PRN
  Administered 2019-07-16: 5.34 via INTRAVENOUS

## 2019-07-20 DIAGNOSIS — K219 Gastro-esophageal reflux disease without esophagitis: Secondary | ICD-10-CM | POA: Diagnosis not present

## 2019-07-20 DIAGNOSIS — R110 Nausea: Secondary | ICD-10-CM | POA: Diagnosis not present

## 2019-07-20 DIAGNOSIS — R1011 Right upper quadrant pain: Secondary | ICD-10-CM | POA: Diagnosis not present

## 2019-07-20 DIAGNOSIS — R1013 Epigastric pain: Secondary | ICD-10-CM | POA: Diagnosis not present

## 2019-07-21 ENCOUNTER — Ambulatory Visit: Payer: 59

## 2019-07-23 ENCOUNTER — Telehealth: Payer: Self-pay | Admitting: Internal Medicine

## 2019-07-23 NOTE — Telephone Encounter (Signed)
Patient is wanting to transfer GI care from [facility] to Dr. Hilarie Fredrickson.   Patient states that she is still having upper abd pain and doesn't feel like she is getting any "answers" at [place].  Records placed on Dr. Vena Rua desk for review.

## 2019-07-24 DIAGNOSIS — K219 Gastro-esophageal reflux disease without esophagitis: Secondary | ICD-10-CM | POA: Diagnosis not present

## 2019-07-24 DIAGNOSIS — R079 Chest pain, unspecified: Secondary | ICD-10-CM | POA: Diagnosis not present

## 2019-07-24 DIAGNOSIS — K279 Peptic ulcer, site unspecified, unspecified as acute or chronic, without hemorrhage or perforation: Secondary | ICD-10-CM | POA: Diagnosis not present

## 2019-07-24 DIAGNOSIS — R0789 Other chest pain: Secondary | ICD-10-CM | POA: Diagnosis not present

## 2019-07-24 DIAGNOSIS — Z79899 Other long term (current) drug therapy: Secondary | ICD-10-CM | POA: Diagnosis not present

## 2019-07-24 DIAGNOSIS — R110 Nausea: Secondary | ICD-10-CM | POA: Diagnosis not present

## 2019-07-24 DIAGNOSIS — K820 Obstruction of gallbladder: Secondary | ICD-10-CM | POA: Diagnosis not present

## 2019-07-24 DIAGNOSIS — R9431 Abnormal electrocardiogram [ECG] [EKG]: Secondary | ICD-10-CM | POA: Diagnosis not present

## 2019-07-24 DIAGNOSIS — K2970 Gastritis, unspecified, without bleeding: Secondary | ICD-10-CM | POA: Diagnosis not present

## 2019-07-24 NOTE — Telephone Encounter (Signed)
I will review records next week and make decision regarding appt.

## 2019-07-25 DIAGNOSIS — R0789 Other chest pain: Secondary | ICD-10-CM | POA: Diagnosis not present

## 2019-07-25 DIAGNOSIS — K279 Peptic ulcer, site unspecified, unspecified as acute or chronic, without hemorrhage or perforation: Secondary | ICD-10-CM | POA: Diagnosis not present

## 2019-07-25 DIAGNOSIS — K219 Gastro-esophageal reflux disease without esophagitis: Secondary | ICD-10-CM | POA: Diagnosis not present

## 2019-07-25 DIAGNOSIS — Z79899 Other long term (current) drug therapy: Secondary | ICD-10-CM | POA: Diagnosis not present

## 2019-07-25 DIAGNOSIS — K2970 Gastritis, unspecified, without bleeding: Secondary | ICD-10-CM | POA: Diagnosis not present

## 2019-07-29 NOTE — Telephone Encounter (Signed)
Dr. Hilarie Fredrickson reviewed records and is okay with scheduling

## 2019-07-31 DIAGNOSIS — Z87442 Personal history of urinary calculi: Secondary | ICD-10-CM

## 2019-07-31 HISTORY — DX: Personal history of urinary calculi: Z87.442

## 2019-08-03 DIAGNOSIS — R5383 Other fatigue: Secondary | ICD-10-CM | POA: Diagnosis not present

## 2019-08-03 DIAGNOSIS — F419 Anxiety disorder, unspecified: Secondary | ICD-10-CM | POA: Diagnosis not present

## 2019-08-09 DIAGNOSIS — F458 Other somatoform disorders: Secondary | ICD-10-CM | POA: Diagnosis not present

## 2019-08-09 DIAGNOSIS — H6980 Other specified disorders of Eustachian tube, unspecified ear: Secondary | ICD-10-CM | POA: Diagnosis not present

## 2019-08-09 DIAGNOSIS — R42 Dizziness and giddiness: Secondary | ICD-10-CM | POA: Diagnosis not present

## 2019-08-13 DIAGNOSIS — R319 Hematuria, unspecified: Secondary | ICD-10-CM | POA: Diagnosis not present

## 2019-08-15 ENCOUNTER — Other Ambulatory Visit: Payer: Self-pay

## 2019-08-15 ENCOUNTER — Encounter: Payer: Self-pay | Admitting: Intensive Care

## 2019-08-15 ENCOUNTER — Emergency Department: Payer: 59

## 2019-08-15 ENCOUNTER — Emergency Department
Admission: EM | Admit: 2019-08-15 | Discharge: 2019-08-15 | Disposition: A | Discharge status: Home / Self Care | Payer: 59 | Attending: Emergency Medicine | Admitting: Emergency Medicine

## 2019-08-15 DIAGNOSIS — R0789 Other chest pain: Secondary | ICD-10-CM | POA: Diagnosis not present

## 2019-08-15 DIAGNOSIS — K2900 Acute gastritis without bleeding: Secondary | ICD-10-CM | POA: Insufficient documentation

## 2019-08-15 DIAGNOSIS — R531 Weakness: Secondary | ICD-10-CM | POA: Diagnosis not present

## 2019-08-15 DIAGNOSIS — Z79899 Other long term (current) drug therapy: Secondary | ICD-10-CM | POA: Diagnosis not present

## 2019-08-15 DIAGNOSIS — R079 Chest pain, unspecified: Secondary | ICD-10-CM | POA: Diagnosis present

## 2019-08-15 DIAGNOSIS — R072 Precordial pain: Secondary | ICD-10-CM | POA: Diagnosis not present

## 2019-08-15 DIAGNOSIS — R29818 Other symptoms and signs involving the nervous system: Secondary | ICD-10-CM | POA: Diagnosis not present

## 2019-08-15 HISTORY — DX: Gastric ulcer, unspecified as acute or chronic, without hemorrhage or perforation: K25.9

## 2019-08-15 LAB — CBC
HCT: 42.2 % (ref 36.0–46.0)
Hemoglobin: 14.2 g/dL (ref 12.0–15.0)
MCH: 30.1 pg (ref 26.0–34.0)
MCHC: 33.6 g/dL (ref 30.0–36.0)
MCV: 89.6 fL (ref 80.0–100.0)
Platelets: 267 10*3/uL (ref 150–400)
RBC: 4.71 MIL/uL (ref 3.87–5.11)
RDW: 11.8 % (ref 11.5–15.5)
WBC: 7 10*3/uL (ref 4.0–10.5)
nRBC: 0 % (ref 0.0–0.2)

## 2019-08-15 LAB — URINALYSIS, COMPLETE (UACMP) WITH MICROSCOPIC
Bacteria, UA: NONE SEEN
Bilirubin Urine: NEGATIVE
Glucose, UA: NEGATIVE mg/dL
Ketones, ur: 5 mg/dL — AB
Leukocytes,Ua: NEGATIVE
Nitrite: NEGATIVE
Protein, ur: NEGATIVE mg/dL
Specific Gravity, Urine: 1.002 — ABNORMAL LOW (ref 1.005–1.030)
pH: 6 (ref 5.0–8.0)

## 2019-08-15 LAB — BASIC METABOLIC PANEL
Anion gap: 13 (ref 5–15)
BUN: 11 mg/dL (ref 6–20)
CO2: 25 mmol/L (ref 22–32)
Calcium: 9.5 mg/dL (ref 8.9–10.3)
Chloride: 99 mmol/L (ref 98–111)
Creatinine, Ser: 0.65 mg/dL (ref 0.44–1.00)
GFR calc Af Amer: >60 mL/min (ref >60)
GFR calc non Af Amer: >60 mL/min (ref >60)
Glucose, Bld: 94 mg/dL (ref 70–99)
Potassium: 3.7 mmol/L (ref 3.5–5.1)
Sodium: 137 mmol/L (ref 135–145)

## 2019-08-15 LAB — TROPONIN I (HIGH SENSITIVITY): Troponin I (High Sensitivity): 3 ng/L (ref <18)

## 2019-08-15 LAB — POCT PREGNANCY, URINE: Preg Test, Ur: NEGATIVE

## 2019-08-15 MED ORDER — GADOBUTROL 1 MMOL/ML IV SOLN
7.0000 mL | Freq: Once | INTRAVENOUS | Status: AC | PRN
  Administered 2019-08-15: 7 mL via INTRAVENOUS

## 2019-08-15 NOTE — ED Notes (Signed)
Patient transported to X-ray 

## 2019-08-15 NOTE — ED Notes (Signed)
First Nurse Note: Pt to ED c/o right sided chest pain and leg pain. Pt states that she has a sensation of being "pulled" to the right side. Pt is in NAD.

## 2019-08-15 NOTE — ED Notes (Signed)
Patient transported to MRI 

## 2019-08-15 NOTE — ED Notes (Signed)
MRI on phone to do screening 

## 2019-08-15 NOTE — ED Triage Notes (Addendum)
Patient reports right sided chest discomfort with pulling to right arm. C/o feeling like she needs to burp and like something is sitting on her chest. Patient crying in triage. Reports this has been going on for 8 months and they keep telling her it is acid reflux. Diagnosed with stomach ulcer by GI recently

## 2019-08-15 NOTE — ED Provider Notes (Signed)
[facility] Emergency Department Provider Note   ____________________________________________    I have reviewed the triage vital signs and the nursing notes.   HISTORY  Chief Complaint Abnormal sensations    HPI Barbara Cole is a 28 y.o. female who reports that since yesterday she has been having a feeling of heaviness in her right shoulder which does not seem to affect her arm or her strength.  She also describes intermittent sensation of weakness in her legs bilaterally although this seems to be resolved currently.  She notes frequent vertigo-like symptoms as well.  Additionally she describes near chronic epigastric pain for which she is being evaluated by GI and has had endoscopy and been diagnosed with gastritis and ulcers.  Past Medical History:  Diagnosis Date  . Anxiety   . Stomach ulcer     There are no active problems to display for this patient.   Past Surgical History:  Procedure Laterality Date  . ESOPHAGOGASTRODUODENOSCOPY (EGD) WITH PROPOFOL N/A 07/12/2019   Procedure: ESOPHAGOGASTRODUODENOSCOPY (EGD) WITH PROPOFOL;  Surgeon: Christena DeemSkulskie, Martin U, MD;  [facility];  Service: Endoscopy;  Laterality: N/A;  . WISDOM TOOTH EXTRACTION      Prior to Admission medications   Medication Sig Start Date End Date Taking? Authorizing Provider  ALPRAZolam (XANAX XR) 0.5 MG 24 hr tablet Take 0.5 mg by mouth daily.    [provider]  alum & mag hydroxide-simeth (MAALOX MAX) 400-400-40 MG/5ML suspension Take 10 mLs by mouth every 6 (six) hours as needed for indigestion. 06/19/19   Long, Arlyss RepressJoshua G, MD  buPROPion (WELLBUTRIN) 100 MG tablet Take 150 mg by mouth every morning.    [provider]  famotidine (PEPCID) 20 MG tablet Take 1 tablet (20 mg total) by mouth 2 (two) times daily. Patient not taking: Reported on 07/12/2019 06/22/19   Sharman CheekStafford, Phillip, MD  glycopyrrolate (ROBINUL) 1 MG tablet Take 1 tablet (1 mg  total) by mouth 3 (three) times daily as needed (abd pain). 07/14/19   Linwood DibblesKnapp, Jon, MD  metoCLOPramide (REGLAN) 10 MG tablet Take 1 tablet (10 mg total) by mouth every 6 (six) hours as needed. 06/22/19   Sharman CheekStafford, Phillip, MD  ondansetron (ZOFRAN ODT) 4 MG disintegrating tablet Take 1 tablet (4 mg total) by mouth every 8 (eight) hours as needed for nausea. 06/19/19   Long, Arlyss RepressJoshua G, MD  pantoprazole (PROTONIX) 20 MG tablet Take 1 tablet (20 mg total) by mouth 2 (two) times daily. Patient not taking: Reported on 07/12/2019 06/18/19   Ward, Chase PicketJaime Pilcher, PA-C  sertraline (ZOLOFT) 25 MG tablet Take 25 mg by mouth daily.    [provider]  sucralfate (CARAFATE) 1 g tablet Take 1 tablet (1 g total) by mouth 4 (four) times daily -  with meals and at bedtime. 06/18/19   Ward, Chase PicketJaime Pilcher, PA-C     Allergies Patient has no known allergies.  History reviewed. No pertinent family history.  Social History Social History   Tobacco Use  . Smoking status: Never Smoker  . Smokeless tobacco: Never Used  Substance Use Topics  . Alcohol use: Yes    Frequency: Never    Comment: occ  . Drug use: Never    Review of Systems  Constitutional: No fever/chills Eyes: No change in vision ENT: No sore throat. Cardiovascular: Denies chest pain. Respiratory: Denies shortness of breath. Gastrointestinal: As above Genitourinary: Negative for dysuria. Musculoskeletal: Negative for back pain. Skin: Negative for rash. Neurological: No headache, as  above   ____________________________________________   PHYSICAL EXAM:  VITAL SIGNS: ED Triage Vitals [08/15/19 1502]  Enc Vitals Group     BP      Pulse      Resp      Temp      Temp src      SpO2      Weight 73.9 kg (163 lb)     Height 1.575 m (5\' 2" )     Head Circumference      Peak Flow      Pain Score 8     Pain Loc      Pain Edu?      Excl. in [place]?     Constitutional: Alert and oriented.  Eyes: Conjunctivae are normal.  PERRLA, EOMI  Head: Atraumatic. Nose: No congestion/rhinnorhea. Mouth/Throat: Mucous membranes are moist.    Cardiovascular: Normal rate, regular rhythm. Grossly normal heart sounds.  Good peripheral circulation. Respiratory: Normal respiratory effort.  No retractions. Lungs CTAB. Gastrointestinal: Soft and nontender. No distention.  No CVA tenderness.  Musculoskeletal:   Warm and well perfused Neurologic:  Normal speech and language. No gross focal neurologic deficits are appreciated.  Normal strength in all extremities, cranial nerves II through XII are normal Skin:  Skin is warm, dry and intact. No rash noted. Psychiatric: Mood and affect are normal. Speech and behavior are normal.  ____________________________________________   LABS (all labs ordered are listed, but only abnormal results are displayed)  Labs Reviewed  URINALYSIS, COMPLETE (UACMP) WITH MICROSCOPIC - Abnormal; Notable for the following components:      Result Value   Color, Urine STRAW (*)    APPearance CLEAR (*)    Specific Gravity, Urine 1.002 (*)    Hgb urine dipstick SMALL (*)    Ketones, ur 5 (*)    All other components within normal limits  BASIC METABOLIC PANEL  CBC  POC URINE PREG, ED  POCT PREGNANCY, URINE  TROPONIN I (HIGH SENSITIVITY)  TROPONIN I (HIGH SENSITIVITY)   ____________________________________________  EKG  ED ECG REPORT I, Lavonia Drafts, the attending physician, personally viewed and interpreted this ECG.  Date: 08/15/2019  Rhythm: normal sinus rhythm QRS Axis: normal Intervals: normal ST/T Wave abnormalities: normal Narrative Interpretation: no evidence of acute ischemia  ____________________________________________  RADIOLOGY  MR with and without ____________________________________________   PROCEDURES  Procedure(s) performed: No  Procedures   Critical Care performed: No ____________________________________________   INITIAL IMPRESSION / ASSESSMENT AND PLAN / ED COURSE   Pertinent labs & imaging results that were available during my care of the patient were reviewed by me and considered in my medical decision making (see chart for details).  Patient presents with unusual complaints of a sense of heaviness in her right shoulder, intermittent weakness in both of her legs bilaterally.  She admits to history of significant anxiety but is quite concerned about this.  Multiple sclerosis could present with on symptoms such as this, will obtain MR brain with and without.  Lab works quite reassuring.  MRI is normal, patient is quite reassured by this, she will follow-up with her PCP and GI for further evaluation    ____________________________________________   FINAL CLINICAL IMPRESSION(S) / ED DIAGNOSES  Final diagnoses:  Atypical chest pain  Acute gastritis without hemorrhage, unspecified gastritis type        Note:  This document was prepared using Dragon voice recognition software and may include unintentional dictation errors.   Lavonia Drafts, MD 08/15/19 2014

## 2019-08-17 ENCOUNTER — Other Ambulatory Visit: Payer: Self-pay

## 2019-08-17 ENCOUNTER — Encounter: Payer: Self-pay | Admitting: Urology

## 2019-08-17 ENCOUNTER — Ambulatory Visit (INDEPENDENT_AMBULATORY_CARE_PROVIDER_SITE_OTHER): Payer: 59 | Admitting: Urology

## 2019-08-17 VITALS — BP 126/89 | HR 82 | Ht 62.0 in | Wt 163.0 lb

## 2019-08-17 DIAGNOSIS — R1011 Right upper quadrant pain: Secondary | ICD-10-CM | POA: Diagnosis not present

## 2019-08-17 DIAGNOSIS — M549 Dorsalgia, unspecified: Secondary | ICD-10-CM | POA: Diagnosis not present

## 2019-08-17 DIAGNOSIS — R310 Gross hematuria: Secondary | ICD-10-CM

## 2019-08-17 DIAGNOSIS — Z87898 Personal history of other specified conditions: Secondary | ICD-10-CM | POA: Diagnosis not present

## 2019-08-17 DIAGNOSIS — R3129 Other microscopic hematuria: Secondary | ICD-10-CM | POA: Diagnosis not present

## 2019-08-17 DIAGNOSIS — N200 Calculus of kidney: Secondary | ICD-10-CM | POA: Diagnosis not present

## 2019-08-17 DIAGNOSIS — N281 Cyst of kidney, acquired: Secondary | ICD-10-CM

## 2019-08-17 LAB — MICROSCOPIC EXAMINATION: RBC, Urine: NONE SEEN /hpf (ref 0–2)

## 2019-08-17 LAB — URINALYSIS, COMPLETE
Bilirubin, UA: NEGATIVE
Glucose, UA: NEGATIVE
Leukocytes,UA: NEGATIVE
Nitrite, UA: NEGATIVE
Specific Gravity, UA: 1.025 (ref 1.005–1.030)
Urobilinogen, Ur: 1 mg/dL (ref 0.2–1.0)
pH, UA: 5 (ref 5.0–7.5)

## 2019-08-17 NOTE — Progress Notes (Signed)
08/17/2019 9:11 AM   Orene DesanctisMakenzie B Prust [birth year] [id number]  Referring provider: Christeen DouglasBeasley, Bethany, MD [address] [place],  [place] [id number]  Chief Complaint  Patient presents with  . Kidney Cyst    New patient    HPI: 28 year old female patient with complex renal cyst.  She underwent a CT abdomen pelvis with contrast on 06/22/2019 for further evaluation of epigastric pain and nausea.  Incidentally, she was noted to have several punctate right renal calculi as well as a mildly complex cyst in the left kidney with internal septations measuring 2 cm.  There is also a low-density lesion in the right kidney measuring 3 cm.  Both of these were incompletely characterized on this study.  No other GU pathology was identified.  She was ultimately diagnosed with gastritis found to have gastritis and ulcers on endoscopy.  She also reports an episode back in June just prior to the CT scan where she had episode of gross hematuria.  She reports that she had a long day at work standing on her feet.  She had a feeling pressure in her lower abdomen which also radiated to her left flank.  At the end of the day, she saw a fleck in the toilet and her pain resolved.  She is wondering if this is a stone episode.  She denies any previous stone episodes.  She also reports that last week, she was seen by her OB/GYN, Dr. Dalbert GarnetBeasley and was noted of blood in her urine.  She was menstruating at the time and had tampon in.  Her UA today is negative.  She has no associated urinary symptoms including no urgency, frequency, dysuria.  No history of UTIs.  She reports that she was scheduled for an MRI of the abdomen, however, she was in the emergency room the previous night prior to when her MRI was scheduled.  She did up needing to reschedule but has not yet rescheduled the study.  She does report that she continues to have flares with her gastritis.  She has been advised to drink anything though make her more  alkaline including life water.  She is also been eating at least 2 times per day.  She is not able to tolerate PPIs or H2 blockers for the most part as they make her feel "drunk".  She is extremely anxious today.   PMH: Past Medical History:  Diagnosis Date  . Anxiety   . Stomach ulcer     Surgical History: Past Surgical History:  Procedure Laterality Date  . ESOPHAGOGASTRODUODENOSCOPY (EGD) WITH PROPOFOL N/A 07/12/2019   Procedure: ESOPHAGOGASTRODUODENOSCOPY (EGD) WITH PROPOFOL;  Surgeon: Christena DeemSkulskie, Martin U, MD;  [facility];  Service: Endoscopy;  Laterality: N/A;  . WISDOM TOOTH EXTRACTION      Home Medications:  Allergies as of 08/17/2019   No Known Allergies     Medication List       Accurate as of August 17, 2019  9:11 AM. If you have any questions, ask your nurse or doctor.        STOP taking these medications   buPROPion 100 MG tablet Commonly known as: WELLBUTRIN Stopped by: Vanna ScotlandAshley Kohl Polinsky, MD   famotidine 20 MG tablet Commonly known as: PEPCID Stopped by: Vanna ScotlandAshley Felicha Frayne, MD   glycopyrrolate 1 MG tablet Commonly known as: Robinul Stopped by: Vanna ScotlandAshley Delenn Ahn, MD   Maalox Max 400-400-40 MG/5ML suspension Generic drug: alum & mag hydroxide-simeth Stopped by: Vanna ScotlandAshley Leonard Feigel, MD   metoCLOPramide 10 MG tablet Commonly  known as: REGLAN Stopped by: Vanna ScotlandAshley Maitland Muhlbauer, MD   pantoprazole 20 MG tablet Commonly known as: PROTONIX Stopped by: Vanna ScotlandAshley Arrie Borrelli, MD   sertraline 25 MG tablet Commonly known as: ZOLOFT Stopped by: Vanna ScotlandAshley Karn Derk, MD   sucralfate 1 g tablet Commonly known as: Carafate Stopped by: Vanna ScotlandAshley Jermond Burkemper, MD     TAKE these medications   ALPRAZolam 0.5 MG 24 hr tablet Commonly known as: XANAX XR Take 0.5 mg by mouth daily.   ondansetron 4 MG disintegrating tablet Commonly known as: Zofran ODT Take 1 tablet (4 mg total) by mouth every 8 (eight) hours as needed for nausea.       Allergies: No Known Allergies  Family  History: No family history on file.  Social History:  reports that she has never smoked. She has never used smokeless tobacco. She reports current alcohol use. She reports that she does not use drugs.  ROS: UROLOGY Frequent Urination?: No Hard to postpone urination?: No Burning/pain with urination?: No Get up at night to urinate?: No Leakage of urine?: No Urine stream starts and stops?: No Trouble starting stream?: No Do you have to strain to urinate?: No Blood in urine?: Yes Urinary tract infection?: No Sexually transmitted disease?: No Injury to kidneys or bladder?: No Painful intercourse?: No Weak stream?: No Currently pregnant?: No Vaginal bleeding?: No Last menstrual period?: 08/13/2019  Gastrointestinal Nausea?: Yes Vomiting?: No Indigestion/heartburn?: Yes Diarrhea?: No Constipation?: No  Constitutional Fever: No Night sweats?: No Weight loss?: No Fatigue?: No  Skin Skin rash/lesions?: No Itching?: No  Eyes Blurred vision?: No Double vision?: No  Ears/Nose/Throat Sore throat?: No Sinus problems?: No  Hematologic/Lymphatic Swollen glands?: No Easy bruising?: No  Cardiovascular Leg swelling?: No Chest pain?: No  Respiratory Cough?: No Shortness of breath?: No  Endocrine Excessive thirst?: No  Musculoskeletal Back pain?: No Joint pain?: No  Neurological Headaches?: No Dizziness?: No  Psychologic Depression?: No Anxiety?: Yes  Physical Exam: BP 126/89   Pulse 82   Ht 5\' 2"  (1.575 m)   Wt 163 lb (73.9 kg)   LMP 08/13/2019 (Exact Date)   BMI 29.81 kg/m   Constitutional:  Alert and oriented, No acute distress. HEENT: [place] AT, moist mucus membranes.  Trachea midline, no masses. Cardiovascular: No clubbing, cyanosis, or edema. Respiratory: Normal respiratory effort, no increased work of breathing. Skin: No rashes, bruises or suspicious lesions. Neurologic: Grossly intact, no focal deficits, moving all 4 extremities. Psychiatric:  Normal mood and affect.  Laboratory Data: Lab Results  Component Value Date   WBC 7.0 08/15/2019   HGB 14.2 08/15/2019   HCT 42.2 08/15/2019   MCV 89.6 08/15/2019   PLT 267 08/15/2019    Lab Results  Component Value Date   CREATININE 0.65 08/15/2019    Urinalysis UA today unremarkable, no WBCs or RBCs.  Pertinent Imaging: CLINICAL DATA:  Epigastric pain and nausea for the past 3 days.  EXAM: CT ABDOMEN AND PELVIS WITH CONTRAST  TECHNIQUE: Multidetector CT imaging of the abdomen and pelvis was performed using the standard protocol following bolus administration of intravenous contrast.  CONTRAST:  100mL OMNIPAQUE IOHEXOL 300 MG/ML  SOLN  COMPARISON:  Right upper quadrant ultrasound from same day.  FINDINGS: Lower chest: No acute abnormality.  Hepatobiliary: No focal liver abnormality is seen. No gallstones, gallbladder wall thickening, or biliary dilatation.  Pancreas: Unremarkable. No pancreatic ductal dilatation or surrounding inflammatory changes.  Spleen: Normal in size without focal abnormality.  Adrenals/Urinary Tract: The adrenal glands are unremarkable. There are several punctate  right renal calculi. There are multiple mildly complex cyst in the left kidney with internal septations measuring up to 2 cm. There are also several low to intermediate density lesions in the left kidney measuring up to 3.0 cm that are incompletely characterized. No hydronephrosis. The bladder is unremarkable for the degree of distention.  Stomach/Bowel: Stomach is within normal limits. Appendix appears normal. No evidence of bowel wall thickening, distention, or inflammatory changes.  Vascular/Lymphatic: No significant vascular findings are present. No enlarged abdominal or pelvic lymph nodes.  Reproductive: The uterus and left ovary are unremarkable. 3.4 cm simple cyst in the right ovary.  Other: Small fat containing paraumbilical hernia. No free fluid or  pneumoperitoneum.  Musculoskeletal: No acute or significant osseous findings.  IMPRESSION: 1.  No acute intra-abdominal process. 2. Several complex cysts and indeterminate lesions in the left kidney. Recommend non-emergent renal protocol MRI or CT with and without contrast for further evaluation. 3. Nonobstructive punctate right nephrolithiasis.   Electronically Signed   By: Titus Dubin M.D.   On: 06/22/2019 17:38  CT abdomen pelvis with contrast imaging was personally reviewed today and with the patient.  Agree with radiologic interpretation.  Assessment & Plan:    1. Acquired cyst of kidney Incidental left incompletely characterized renal cysts.  We discussed Bosniak classification at length today as well as risk factors for malignancy based on the categories.  Suspect that these will likely be Bosniak 2 renal cyst, however, would agree with plan to proceed with MRI of the abdomen with and without contrast for further characterization.  She is agreeable this plan.  Have reordered the study under my name and will call her with these results.  - MR Abdomen W Wo Contrast; Future  2. Microscopic hematuria Several episodes of gross and microscopic hematuria which may be associated with menstruation.  Most recent urinalysis today as well as 2 days ago without any evidence of ongoing microscopic blood.  May also be related to kidney stone.  Given her relatively young age and low risk, do not think that she needs any further evaluation at this time.  CT abdomen pelvis reassuring.  Evaluation MRI as above.  - Urinalysis, Complete  3. Gross hematuria As above  4. Right kidney stone Incidental right punctate lower pole stone  She does have a episode which sounds like a possible stone event just prior to the CT scan possibly from the left.  We discussed general stone prevention techniques including drinking plenty water with goal of producing 2.5 L urine daily,  increased citric acid intake, avoidance of high oxalate containing foods, and decreased salt intake.  Information about dietary recommendations given today.  Would also recommend that she cut back/eliminate Tums as possible as this may be contributing factor history of stone formation/recent stone episodes.  No indication for intervention on her right-sided stone given the relatively small size.  Plan to follow her clinically.   Call with MRI results  Hollice Espy, MD  [facility] [address] [place], [place] [address] [phone]

## 2019-08-19 ENCOUNTER — Ambulatory Visit: Payer: Self-pay | Admitting: General Surgery

## 2019-08-19 DIAGNOSIS — K8050 Calculus of bile duct without cholangitis or cholecystitis without obstruction: Secondary | ICD-10-CM | POA: Diagnosis not present

## 2019-08-23 ENCOUNTER — Other Ambulatory Visit: Payer: 59

## 2019-08-23 ENCOUNTER — Other Ambulatory Visit: Payer: Self-pay

## 2019-08-23 ENCOUNTER — Encounter
Admission: RE | Admit: 2019-08-23 | Discharge: 2019-08-23 | Disposition: A | Discharge status: Home / Self Care | Payer: 59 | Source: Ambulatory Visit | Attending: General Surgery | Admitting: General Surgery

## 2019-08-23 DIAGNOSIS — R109 Unspecified abdominal pain: Secondary | ICD-10-CM | POA: Diagnosis present

## 2019-08-23 DIAGNOSIS — K660 Peritoneal adhesions (postprocedural) (postinfection): Secondary | ICD-10-CM | POA: Diagnosis not present

## 2019-08-23 DIAGNOSIS — Z20828 Contact with and (suspected) exposure to other viral communicable diseases: Secondary | ICD-10-CM | POA: Insufficient documentation

## 2019-08-23 DIAGNOSIS — K811 Chronic cholecystitis: Secondary | ICD-10-CM | POA: Diagnosis not present

## 2019-08-23 DIAGNOSIS — Z01812 Encounter for preprocedural laboratory examination: Secondary | ICD-10-CM | POA: Insufficient documentation

## 2019-08-23 HISTORY — DX: Depression, unspecified: F32.A

## 2019-08-23 NOTE — Patient Instructions (Signed)
INSTRUCTIONS FOR SURGERY     Your surgery is scheduled for:   Wednesday, August 25, 2019     To find out your arrival time for the day of surgery,          please call [phone] between 1 pm and 3 pm on :  August 24, 2019     When you arrive for surgery, report to the SECOND FLOOR OF THE MEDICAL MALL.       Do NOT stop on the first floor to register.    REMEMBER: Instructions that are not followed completely may result in serious medical risk,  up to and including death, or upon the discretion of your surgeon and anesthesiologist,            your surgery may need to be rescheduled.  __X__ 1. Do not eat food after midnight the night before your procedure.                    No gum, candy, lozenger, tic tacs, tums or hard candies.                  ABSOLUTELY NOTHING SOLID IN YOUR MOUTH AFTER MIDNIGHT                    You may drink unlimited clear liquids up to 2 hours before you are scheduled to arrive for surgery.                   Do not drink anything within those 2 hours unless you need to take medicine, then take the                   smallest amount you need.  Clear liquids include:  water, apple juice without pulp,                   any flavor Gatorade, Black coffee, black tea.  Sugar may be added but no dairy/ honey /lemon.                        Broth and jello is not considered a clear liquid.  __x__  2. On the morning of surgery, please brush your teeth with toothpaste and water. You may rinse with                  mouthwash if you wish but DO NOT SWALLOW TOOTHPASTE OR MOUTHWASH  __X___3. NO alcohol for 24 hours before or after surgery.  __x___ 4.  Do NOT smoke or use e-cigarettes for 24 HOURS PRIOR TO SURGERY.                      DO NOT Use any chewable tobacco products for at least 6 hours prior to surgery.  __x___ 5. If you start any new medication after this appointment and prior to surgery, please        Bring it with you on the day of surgery.  ___x__ 6. Notify your doctor if there is any change in your medical condition, such as fever, infection, vomitting,  Diarrhea or any open sores.  __x___ 7.  USE the CHG SOAP as instructed, the night before surgery and the day of surgery.                   Once you have washed with this soap, do NOT use any of the following: Powders, perfumes                    or lotions. Please do not wear make up, hairpins, clips or nail polish. You MAY wear deodorant.                   Men may shave their face and neck.  Women need to shave 48 hours prior to surgery.                   DO NOT wear ANY jewelry on the day of surgery. If there are rings that are too tight to                    remove easily, please address this prior to the surgery day. Piercings need to be removed.                                                                     NO METAL ON YOUR BODY.                    Do NOT bring any valuables.  If you came to Pre-Admit testing then you will not need license,                     insurance card or credit card.  If you will be staying overnight, please either leave your things in                     the car or have your family be responsible for these items.                     [facility] IS NOT RESPONSIBLE FOR BELONGINGS OR VALUABLES.  ___X__ 8. DO NOT wear contact lenses on surgery day.  You may not have dentures,                     Hearing aides, contacts or glasses in the operating room. These items can be                    Placed in the Recovery Room to receive immediately after surgery.  __x___ 9. IF YOU ARE SCHEDULED TO GO HOME ON THE SAME DAY, YOU MUST                   Have someone to drive you home and to stay with you  for the first 24 hours.                    Have an arrangement prior to arriving on surgery day.  ___x__ 10. Take the following medications on the morning of surgery with a sip of water:  1.  XANAX                     2.                     3.                     4.                     5.                     6.  _____ 11.  Follow any instructions provided to you by your surgeon.                        Such as enema, clear liquid bowel prep  __X__  12. STOP ASPIRIN AS OF:  TODAY                       THIS INCLUDES BC POWDERS / GOODIES POWDER  __x___ 13. STOP Anti-inflammatories as of:  TODAY                      This includes IBUPROFEN / MOTRIN / ADVIL / ALEVE/ NAPROXYN                    YOU MAY TAKE TYLENOL ANY TIME PRIOR TO SURGERY.  _____ 14.  Stop supplements until after surgery.                     This includes:                 You may continue taking Vitamin B12 / Vitamin D3 but do not take on the morning of surgery.  _____ 15. Bring your CPAP machine into preop with you on the morning of surgery.  ______18. If staying overnight, please have appropriate shoes to wear to be able to walk around the unit.                   Wear clean and comfortable clothing to the hospital.

## 2019-08-24 LAB — SARS CORONAVIRUS 2 (TAT 6-24 HRS): SARS Coronavirus 2: NEGATIVE

## 2019-08-24 MED ORDER — CEFAZOLIN SODIUM-DEXTROSE 2-4 GM/100ML-% IV SOLN
2.0000 g | INTRAVENOUS | Status: AC
  Administered 2019-08-25: 2 g via INTRAVENOUS

## 2019-08-25 ENCOUNTER — Encounter
Admission: RE | Disposition: A | Discharge status: Home / Self Care | Payer: Self-pay | Source: Home / Self Care | Attending: General Surgery

## 2019-08-25 ENCOUNTER — Ambulatory Visit: Payer: 59 | Admitting: Anesthesiology

## 2019-08-25 ENCOUNTER — Ambulatory Visit
Admission: RE | Admit: 2019-08-25 | Discharge: 2019-08-25 | Disposition: A | Discharge status: Home / Self Care | Payer: 59 | Attending: General Surgery | Admitting: General Surgery

## 2019-08-25 ENCOUNTER — Other Ambulatory Visit: Payer: Self-pay

## 2019-08-25 ENCOUNTER — Encounter: Payer: Self-pay | Admitting: *Deleted

## 2019-08-25 DIAGNOSIS — K8050 Calculus of bile duct without cholangitis or cholecystitis without obstruction: Secondary | ICD-10-CM | POA: Diagnosis not present

## 2019-08-25 DIAGNOSIS — Z20828 Contact with and (suspected) exposure to other viral communicable diseases: Secondary | ICD-10-CM | POA: Diagnosis not present

## 2019-08-25 DIAGNOSIS — K811 Chronic cholecystitis: Secondary | ICD-10-CM | POA: Insufficient documentation

## 2019-08-25 DIAGNOSIS — K660 Peritoneal adhesions (postprocedural) (postinfection): Secondary | ICD-10-CM | POA: Insufficient documentation

## 2019-08-25 HISTORY — PX: CHOLECYSTECTOMY: SHX55

## 2019-08-25 LAB — POCT PREGNANCY, URINE: Preg Test, Ur: NEGATIVE

## 2019-08-25 SURGERY — LAPAROSCOPIC CHOLECYSTECTOMY
Anesthesia: General

## 2019-08-25 MED ORDER — FENTANYL CITRATE (PF) 100 MCG/2ML IJ SOLN
25.0000 ug | INTRAMUSCULAR | Status: DC | PRN
  Administered 2019-08-25 (×4): 25 ug via INTRAVENOUS

## 2019-08-25 MED ORDER — ACETAMINOPHEN 10 MG/ML IV SOLN
INTRAVENOUS | Status: DC | PRN
  Administered 2019-08-25: 1000 mg via INTRAVENOUS

## 2019-08-25 MED ORDER — ONDANSETRON HCL 4 MG/2ML IJ SOLN
INTRAMUSCULAR | Status: AC
  Filled 2019-08-25: qty 2

## 2019-08-25 MED ORDER — CEFAZOLIN SODIUM-DEXTROSE 2-4 GM/100ML-% IV SOLN
INTRAVENOUS | Status: AC
  Filled 2019-08-25: qty 100

## 2019-08-25 MED ORDER — LACTATED RINGERS IV SOLN
INTRAVENOUS | Status: DC
  Administered 2019-08-25: 13:00:00 via INTRAVENOUS

## 2019-08-25 MED ORDER — PROMETHAZINE HCL 25 MG/ML IJ SOLN
INTRAMUSCULAR | Status: AC
  Administered 2019-08-25: 6.25 mg via INTRAVENOUS
  Filled 2019-08-25: qty 1

## 2019-08-25 MED ORDER — FENTANYL CITRATE (PF) 100 MCG/2ML IJ SOLN
INTRAMUSCULAR | Status: DC | PRN
  Administered 2019-08-25 (×3): 50 ug via INTRAVENOUS

## 2019-08-25 MED ORDER — SCOPOLAMINE 1 MG/3DAYS TD PT72
1.0000 | MEDICATED_PATCH | TRANSDERMAL | Status: DC
  Administered 2019-08-25: 12:00:00 1.5 mg via TRANSDERMAL

## 2019-08-25 MED ORDER — ACETAMINOPHEN 10 MG/ML IV SOLN
INTRAVENOUS | Status: AC
  Filled 2019-08-25: qty 100

## 2019-08-25 MED ORDER — PROPOFOL 10 MG/ML IV BOLUS
INTRAVENOUS | Status: AC
  Filled 2019-08-25: qty 20

## 2019-08-25 MED ORDER — DEXAMETHASONE SODIUM PHOSPHATE 10 MG/ML IJ SOLN
INTRAMUSCULAR | Status: AC
  Filled 2019-08-25: qty 1

## 2019-08-25 MED ORDER — BUPIVACAINE-EPINEPHRINE (PF) 0.5% -1:200000 IJ SOLN
INTRAMUSCULAR | Status: AC
  Filled 2019-08-25: qty 30

## 2019-08-25 MED ORDER — OXYCODONE HCL 5 MG/5ML PO SOLN: 5.0000 mg | Freq: Once | ORAL | Status: AC | PRN

## 2019-08-25 MED ORDER — FENTANYL CITRATE (PF) 100 MCG/2ML IJ SOLN
INTRAMUSCULAR | Status: AC
  Filled 2019-08-25: qty 2

## 2019-08-25 MED ORDER — SCOPOLAMINE 1 MG/3DAYS TD PT72
MEDICATED_PATCH | TRANSDERMAL | Status: AC
  Administered 2019-08-25: 1.5 mg via TRANSDERMAL
  Filled 2019-08-25: qty 1

## 2019-08-25 MED ORDER — OXYCODONE HCL 5 MG PO TABS
ORAL_TABLET | ORAL | Status: AC
  Administered 2019-08-25: 15:00:00 5 mg via ORAL
  Filled 2019-08-25: qty 1

## 2019-08-25 MED ORDER — SUGAMMADEX SODIUM 200 MG/2ML IV SOLN
INTRAVENOUS | Status: DC | PRN
  Administered 2019-08-25: 200 mg via INTRAVENOUS

## 2019-08-25 MED ORDER — BUPIVACAINE-EPINEPHRINE (PF) 0.5% -1:200000 IJ SOLN
INTRAMUSCULAR | Status: DC | PRN
  Administered 2019-08-25: 20 mL

## 2019-08-25 MED ORDER — PROPOFOL 10 MG/ML IV BOLUS
INTRAVENOUS | Status: DC | PRN
  Administered 2019-08-25: 150 mg via INTRAVENOUS

## 2019-08-25 MED ORDER — OXYCODONE HCL 5 MG PO TABS
5.0000 mg | ORAL_TABLET | Freq: Once | ORAL | Status: AC | PRN
  Administered 2019-08-25: 15:00:00 5 mg via ORAL

## 2019-08-25 MED ORDER — HYDROCODONE-ACETAMINOPHEN 5-325 MG PO TABS: 1.0000 | ORAL_TABLET | ORAL | 0 refills | Status: AC | PRN

## 2019-08-25 MED ORDER — MIDAZOLAM HCL 2 MG/2ML IJ SOLN
INTRAMUSCULAR | Status: AC
  Filled 2019-08-25: qty 2

## 2019-08-25 MED ORDER — FAMOTIDINE 20 MG PO TABS
20.0000 mg | ORAL_TABLET | Freq: Once | ORAL | Status: AC
  Administered 2019-08-25: 12:00:00 20 mg via ORAL

## 2019-08-25 MED ORDER — FENTANYL CITRATE (PF) 100 MCG/2ML IJ SOLN
INTRAMUSCULAR | Status: AC
  Administered 2019-08-25: 25 ug via INTRAVENOUS
  Filled 2019-08-25: qty 2

## 2019-08-25 MED ORDER — SODIUM CHLORIDE FLUSH 0.9 % IV SOLN
INTRAVENOUS | Status: AC
  Filled 2019-08-25: qty 10

## 2019-08-25 MED ORDER — DEXAMETHASONE SODIUM PHOSPHATE 10 MG/ML IJ SOLN
INTRAMUSCULAR | Status: DC | PRN
  Administered 2019-08-25: 10 mg via INTRAVENOUS

## 2019-08-25 MED ORDER — PROMETHAZINE HCL 25 MG/ML IJ SOLN
6.2500 mg | Freq: Once | INTRAMUSCULAR | Status: AC
  Administered 2019-08-25: 14:00:00 6.25 mg via INTRAVENOUS

## 2019-08-25 MED ORDER — FAMOTIDINE 20 MG PO TABS
ORAL_TABLET | ORAL | Status: AC
  Administered 2019-08-25: 20 mg via ORAL
  Filled 2019-08-25: qty 1

## 2019-08-25 MED ORDER — ONDANSETRON HCL 4 MG/2ML IJ SOLN
INTRAMUSCULAR | Status: DC | PRN
  Administered 2019-08-25: 4 mg via INTRAVENOUS

## 2019-08-25 MED ORDER — ROCURONIUM BROMIDE 100 MG/10ML IV SOLN
INTRAVENOUS | Status: DC | PRN
  Administered 2019-08-25: 50 mg via INTRAVENOUS

## 2019-08-25 MED ORDER — MIDAZOLAM HCL 2 MG/2ML IJ SOLN
INTRAMUSCULAR | Status: DC | PRN
  Administered 2019-08-25 (×2): 2 mg via INTRAVENOUS

## 2019-08-25 SURGICAL SUPPLY — 43 items
ADH SKN CLS APL DERMABOND .7 (GAUZE/BANDAGES/DRESSINGS) ×1
APL PRP STRL LF DISP 70% ISPRP (MISCELLANEOUS) ×1
APPLIER CLIP 5 13 M/L LIGAMAX5 (MISCELLANEOUS) ×2
APR CLP MED LRG 5 ANG JAW (MISCELLANEOUS) ×1
BAG SPEC RTRVL LRG 6X4 10 (ENDOMECHANICALS) ×1
BLADE SURG SZ11 CARB STEEL (BLADE) ×2 IMPLANT
CANISTER SUCT 1200ML W/VALVE (MISCELLANEOUS) ×2 IMPLANT
CATH CHOLANG 76X19 KUMAR (CATHETERS) ×2 IMPLANT
CHLORAPREP W/TINT 26 (MISCELLANEOUS) ×2 IMPLANT
CLIP APPLIE 5 13 M/L LIGAMAX5 (MISCELLANEOUS) ×1 IMPLANT
COVER WAND RF STERILE (DRAPES) ×2 IMPLANT
DERMABOND ADVANCED (GAUZE/BANDAGES/DRESSINGS) ×1
DERMABOND ADVANCED .7 DNX12 (GAUZE/BANDAGES/DRESSINGS) ×1 IMPLANT
ELECT REM PT RETURN 9FT ADLT (ELECTROSURGICAL) ×2
ELECTRODE REM PT RTRN 9FT ADLT (ELECTROSURGICAL) ×1 IMPLANT
GLOVE BIO SURGEON STRL SZ 6.5 (GLOVE) ×2 IMPLANT
GLOVE BIOGEL PI IND STRL 6.5 (GLOVE) ×1 IMPLANT
GLOVE BIOGEL PI INDICATOR 6.5 (GLOVE) ×1
GOWN STRL REUS W/ TWL LRG LVL3 (GOWN DISPOSABLE) ×4 IMPLANT
GOWN STRL REUS W/TWL LRG LVL3 (GOWN DISPOSABLE) ×8
GRASPER SUT TROCAR 14GX15 (MISCELLANEOUS) IMPLANT
HEMOSTAT SURGICEL 2X3 (HEMOSTASIS) IMPLANT
IRRIGATION STRYKERFLOW (MISCELLANEOUS) ×1 IMPLANT
IRRIGATOR STRYKERFLOW (MISCELLANEOUS) ×2
IV NS 1000ML (IV SOLUTION) ×2
IV NS 1000ML BAXH (IV SOLUTION) ×1 IMPLANT
KIT TURNOVER KIT A (KITS) ×2 IMPLANT
LABEL OR SOLS (LABEL) ×2 IMPLANT
NDL HYPO 25X1 1.5 SAFETY (NEEDLE) ×1 IMPLANT
NDL INSUFFLATION 14GA 120MM (NEEDLE) ×1 IMPLANT
NEEDLE HYPO 25X1 1.5 SAFETY (NEEDLE) ×2 IMPLANT
NEEDLE INSUFFLATION 14GA 120MM (NEEDLE) ×2 IMPLANT
NS IRRIG 500ML POUR BTL (IV SOLUTION) ×2 IMPLANT
PACK LAP CHOLECYSTECTOMY (MISCELLANEOUS) ×2 IMPLANT
POUCH SPECIMEN RETRIEVAL 10MM (ENDOMECHANICALS) ×2 IMPLANT
SCISSORS METZENBAUM CVD 33 (INSTRUMENTS) ×2 IMPLANT
SET TUBE SMOKE EVAC HIGH FLOW (TUBING) ×2 IMPLANT
SLEEVE ENDOPATH XCEL 5M (ENDOMECHANICALS) ×4 IMPLANT
SUT MNCRL AB 4-0 PS2 18 (SUTURE) ×2 IMPLANT
SUT VIC AB 0 CT1 36 (SUTURE) IMPLANT
SUT VICRYL 0 AB UR-6 (SUTURE) ×2 IMPLANT
TROCAR XCEL NON-BLD 11X100MML (ENDOMECHANICALS) ×2 IMPLANT
TROCAR XCEL NON-BLD 5MMX100MML (ENDOMECHANICALS) ×2 IMPLANT

## 2019-08-25 NOTE — Op Note (Signed)
Preoperative diagnosis: Recurrent biliary colic.  Postoperative diagnosis: Recurrent biliary colic.  Procedure: Laparoscopic Cholecystectomy.   Anesthesia: GETA   Surgeon: Dr. Windell Moment  Wound Classification: Clean Contaminated  Indications: Patient is a 28 y.o. female developed right upper quadrant and on workup was found with thickness of gallbladder wall without cholelithiasis with a normal common duct. Symptoms classical of biliary colic. Patient with previous treatment for reflux and gastritis. Laparoscopic cholecystectomy was elected.  Findings: Critical view of safety achieved Cystic duct and artery identified, ligated and divided Adequate hemostasis  Description of procedure: The patient was placed on the operating table in the supine position. General anesthesia was induced. A time-out was completed verifying correct patient, procedure, site, positioning, and implant(s) and/or special equipment prior to beginning this procedure. An orogastric tube was placed. The abdomen was prepped and draped in the usual sterile fashion.  An incision was made in a natural skin line above the umbilicus.  The fascia was elevated and the Veress needle inserted. Proper position was confirmed by aspiration and saline meniscus test.  The abdomen was insufflated with carbon dioxide to a pressure of 15 mmHg. The patient tolerated insufflation well. A 11-mm trocar was then inserted.  The laparoscope was inserted and the abdomen inspected. No injuries from initial trocar placement were noted. Additional trocars were then inserted in the following locations: a 5-mm trocar in the right epigastrium and two 5-mm trocars along the right costal margin. The abdomen was inspected and no abnormalities were found. The table was placed in the reverse Trendelenburg position with the right side up.  Filmy adhesions between the gallbladder and omentum, duodenum and transverse colon were lysed sharply. The dome of the  gallbladder was grasped with an atraumatic grasper passed through the lateral port and retracted over the dome of the liver. The infundibulum was also grasped with an atraumatic grasper through the midclavicular port and retracted toward the right lower quadrant. This maneuver exposed Calot's triangle. The peritoneum overlying the gallbladder infundibulum was then incised and the cystic duct and cystic artery identified and circumferentially dissected. Critical view of safety reviewed before ligating any structure. The cystic duct and cystic artery were then doubly clipped and divided close to the gallbladder.  The gallbladder was then dissected from its peritoneal attachments by electrocautery. Hemostasis was checked and the gallbladder and contained stones were removed using an endoscopic retrieval bag placed through the umbilical port. The gallbladder was passed off the table as a specimen. The gallbladder fossa was copiously irrigated with saline and hemostasis was obtained. There was no evidence of bleeding from the gallbladder fossa or cystic artery or leakage of the bile from the cystic duct stump. Secondary trocars were removed under direct vision. No bleeding was noted. The laparoscope was withdrawn and the umbilical trocar removed. The abdomen was allowed to collapse. The fascia of the 41mm trocar sites was closed with figure-of-eight 0 vicryl sutures. The skin was closed with subcuticular sutures of 4-0 monocryl and topical skin adhesive. The orogastric tube was removed.  The patient tolerated the procedure well and was taken to the postanesthesia care unit in stable condition.   Specimen: Gallbladder  Complications: None  EBL: 5 mL

## 2019-08-25 NOTE — Transfer of Care (Signed)
Immediate Anesthesia Transfer of Care Note  Patient: Barbara Cole  Procedure(s) Performed: LAPAROSCOPIC CHOLECYSTECTOMY (N/A )  Patient Location: PACU  Anesthesia Type:General  Level of Consciousness: awake  Airway & Oxygen Therapy: Patient Spontanous Breathing and Patient connected to face mask oxygen  Post-op Assessment: Report given to RN and Post -op Vital signs reviewed and stable  Post vital signs: Reviewed and stable  Last Vitals:  Vitals Value Taken Time  BP 142/91 08/25/19 1413  Temp    Pulse 111 08/25/19 1413  Resp 13 08/25/19 1413  SpO2 100 % 08/25/19 1413  Vitals shown include unvalidated device data.  Last Pain:  Vitals:   08/25/19 1135  TempSrc: Temporal  PainSc: 5       Patients Stated Pain Goal: 3 (16/60/63 0160)  Complications: No apparent anesthesia complications

## 2019-08-25 NOTE — Anesthesia Post-op Follow-up Note (Signed)
Anesthesia QCDR form completed.        
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
## 2019-08-25 NOTE — Anesthesia Preprocedure Evaluation (Signed)
Anesthesia Evaluation  Patient identified by MRN, date of birth, ID band Patient awake    Reviewed: Allergy & Precautions, H&P , NPO status , Patient's Chart, lab work & pertinent test results  History of Anesthesia Complications Negative for: history of anesthetic complications  Airway Mallampati: II  TM Distance: <3 FB Neck ROM: full    Dental  (+) Chipped   Pulmonary neg pulmonary ROS, neg shortness of breath,           Cardiovascular Exercise Tolerance: Good (-) angina(-) Past MI and (-) DOE negative cardio ROS       Neuro/Psych PSYCHIATRIC DISORDERS negative neurological ROS     GI/Hepatic Neg liver ROS, PUD, GERD  Medicated and Controlled,  Endo/Other  negative endocrine ROS  Renal/GU      Musculoskeletal   Abdominal   Peds  Hematology negative hematology ROS (+)   Anesthesia Other Findings Past Medical History: No date: Anxiety No date: Depression 07/2019: History of kidney stones     Comment:  right kidney stone currently No date: Stomach ulcer No date: Stomach ulcer  Past Surgical History: 07/12/2019: ESOPHAGOGASTRODUODENOSCOPY (EGD) WITH PROPOFOL; N/A     Comment:  Procedure: ESOPHAGOGASTRODUODENOSCOPY (EGD) WITH               PROPOFOL;  Surgeon: Lollie Sails, MD;  Location:               [facility] ENDOSCOPY;  Service: Endoscopy;  Laterality: N/A; No date: WISDOM TOOTH EXTRACTION     Reproductive/Obstetrics negative OB ROS                             Anesthesia Physical Anesthesia Plan  ASA: II  Anesthesia Plan: General ETT   Post-op Pain Management:    Induction: Intravenous  PONV Risk Score and Plan: Ondansetron, Dexamethasone, Midazolam and Treatment may vary due to age or medical condition  Airway Management Planned: Oral ETT  Additional Equipment:   Intra-op Plan:   Post-operative Plan: Extubation in OR  Informed Consent: I have reviewed the  patients History and Physical, chart, labs and discussed the procedure including the risks, benefits and alternatives for the proposed anesthesia with the patient or authorized representative who has indicated his/her understanding and acceptance.     Dental Advisory Given  Plan Discussed with: Anesthesiologist, CRNA and Surgeon  Anesthesia Plan Comments: (Patient consented for risks of anesthesia including but not limited to:  - adverse reactions to medications - damage to teeth, lips or other oral mucosa - sore throat or hoarseness - Damage to heart, brain, lungs or loss of life  Patient voiced understanding.)        Anesthesia Quick Evaluation

## 2019-08-25 NOTE — Discharge Instructions (Signed)
  Diet: Resume home heart healthy regular diet.   Activity: No heavy lifting >20 pounds (children, pets, laundry, garbage) or strenuous activity until follow-up, but light activity and walking are encouraged. Do not drive or drink alcohol if taking narcotic pain medications.  Wound care: May shower with soapy water and pat dry (do not rub incisions), but no baths or submerging incision underwater until follow-up. (no swimming)   Medications: Resume all home medications. For mild to moderate pain: acetaminophen (Tylenol) or ibuprofen (if no kidney disease). Combining Tylenol with alcohol can substantially increase your risk of causing liver disease. Narcotic pain medications, if prescribed, can be used for severe pain, though may cause nausea, constipation, and drowsiness. Do not combine Tylenol and Norco within a 6 hour period as Norco contains Tylenol. If you do not need the narcotic pain medication, you do not need to fill the prescription.  Call office ([phone]) at any time if any questions, worsening pain, fevers/chills, bleeding, drainage from incision site, or other concerns.   AMBULATORY SURGERY  DISCHARGE INSTRUCTIONS   1) The drugs that you were given will stay in your system until tomorrow so for the next 24 hours you should not:  A) Drive an automobile B) Make any legal decisions C) Drink any alcoholic beverage   2) You may resume regular meals tomorrow.  Today it is better to start with liquids and gradually work up to solid foods.  You may eat anything you prefer, but it is better to start with liquids, then soup and crackers, and gradually work up to solid foods.   3) Please notify your doctor immediately if you have any unusual bleeding, trouble breathing, redness and pain at the surgery site, drainage, fever, or pain not relieved by medication.    4) Additional Instructions:        Please contact your physician with any problems or Same Day Surgery at  [phone], Monday through Friday 6 am to 4 pm, or [place] [facility] [place] Main number at [phone]. 

## 2019-08-25 NOTE — Anesthesia Procedure Notes (Signed)
Procedure Name: Intubation Date/Time: 08/25/2019 12:51 PM Performed by: Philbert Riser, CRNA Pre-anesthesia Checklist: Patient identified, Emergency Drugs available, Suction available, Patient being monitored and Timeout performed Patient Re-evaluated:Patient Re-evaluated prior to induction Oxygen Delivery Method: Circle system utilized and Simple face mask Preoxygenation: Pre-oxygenation with 100% oxygen Induction Type: IV induction Ventilation: Mask ventilation without difficulty Laryngoscope Size: Mac and 3 Grade View: Grade I Tube type: Oral Tube size: 7.0 mm Number of attempts: 1 Airway Equipment and Method: Stylet Placement Confirmation: ETT inserted through vocal cords under direct vision,  positive ETCO2 and breath sounds checked- equal and bilateral Secured at: 21 cm Tube secured with: Tape Dental Injury: Teeth and Oropharynx as per pre-operative assessment

## 2019-08-26 ENCOUNTER — Encounter: Payer: Self-pay | Admitting: General Surgery

## 2019-08-26 NOTE — Anesthesia Postprocedure Evaluation (Signed)
Anesthesia Post Note  Patient: Barbara Cole  Procedure(s) Performed: LAPAROSCOPIC CHOLECYSTECTOMY (N/A )  Patient location during evaluation: PACU Anesthesia Type: General Level of consciousness: awake and alert Pain management: pain level controlled Vital Signs Assessment: post-procedure vital signs reviewed and stable Respiratory status: spontaneous breathing, nonlabored ventilation, respiratory function stable and patient connected to nasal cannula oxygen Cardiovascular status: blood pressure returned to baseline and stable Postop Assessment: no apparent nausea or vomiting Anesthetic complications: no     Last Vitals:  Vitals:   08/25/19 1543 08/25/19 1634  BP: 135/75 112/71  Pulse: 89 87  Resp: 14 16  Temp: (!) 36.3 C   SpO2: 96% 99%    Last Pain:  Vitals:   08/25/19 1634  TempSrc:   PainSc: 5                  Precious Haws Piscitello

## 2019-08-27 LAB — SURGICAL PATHOLOGY

## 2019-08-31 ENCOUNTER — Other Ambulatory Visit: Payer: Self-pay

## 2019-08-31 ENCOUNTER — Ambulatory Visit: Payer: 59 | Admitting: Gastroenterology

## 2019-08-31 ENCOUNTER — Encounter: Payer: Self-pay | Admitting: Gastroenterology

## 2019-08-31 ENCOUNTER — Encounter

## 2019-08-31 VITALS — BP 122/64 | HR 88 | Temp 98.2°F | Ht 62.0 in | Wt 159.0 lb

## 2019-08-31 DIAGNOSIS — R1013 Epigastric pain: Secondary | ICD-10-CM | POA: Diagnosis not present

## 2019-08-31 DIAGNOSIS — R0789 Other chest pain: Secondary | ICD-10-CM

## 2019-08-31 DIAGNOSIS — K811 Chronic cholecystitis: Secondary | ICD-10-CM | POA: Diagnosis not present

## 2019-08-31 MED ORDER — DICYCLOMINE HCL 10 MG PO CAPS: 10.0000 mg | ORAL_CAPSULE | Freq: Three times a day (TID) | ORAL | 11 refills | Status: DC

## 2019-08-31 NOTE — Patient Instructions (Addendum)
We have sent the following medications to your pharmacy for you to pick up at your convenience: dicyclomine.   If your symptoms have not improved on dicyclomine then start FD Gard samples 2 capsules by mouth three times a before meals. If this helps your symptoms then you can purchase this over the counter.   Take over the counter Gas-x three times a day before meals for gas and bloating.  You can also take Advil 400 mg three times a day for your musculoskeletal pain.   Follow up with your primary care physician for your rib pain.   Normal BMI (Body Mass Index- based on height and weight) is between 19 and 25. Your BMI today is Body mass index is 29.08 kg/m. Marland Kitchen Please consider follow up  regarding your BMI with your Primary Care Provider.  Thank you for choosing me and [facility].  Pricilla Riffle. Dagoberto Ligas., MD., Marval Regal

## 2019-08-31 NOTE — Progress Notes (Addendum)
History of Present Illness: This is a 28 year old female referred by Christeen Douglas, MD for the evaluation of LUQ pain, L chest pain, mid chest pain, gas, bloating, dyspepsia.  Patient has had an extensive recent GI evaluation at [facility] and [facility] followed by Dr. Marva Panda. The patients records were reviewed and she was accepted as a patient by Dr. Rhea Belton who is unavailable today so she was added on to my schedule.  She underwent laparoscopic cholecystectomy at [facility] Dr. Hazle Quant on August 26 with pathology showing chronic cholecystitis without cholelithiasis.  EGD performed July 12, 2019 by Dr. Marva Panda showed moderate inflammatory changes with erosions at the incisura, antrum and prepyloric areas.  The Z line was variable.  Pathology showed a mild reactive gastropathy, negative for H. pylori, and GE junction biopsy showed chronic inactive gastritis negative for intestinal metaplasia.  CT AP on June 22, 2019 showed no acute intra-abdominal process, nonobstructive right nephrolithiasis, several complex and indeterminate lesions in the left kidney.  Abdominal MRI scheduled for September 24.  She was intolerant of AcipHex, pantoprazole, Pepcid.  She complains of postprandial mid chest, left upper quadrant, left chest pain often associated with bloating.  She has soreness at her incision sites.  She relates a 30 pound weight loss over several months. She feels several symptoms have improved since cholecystectomy 6 days ago.  She states following her cholecystectomy she is been able to eat more normally. Denies constipation, diarrhea, change in stool caliber, melena, hematochezia, vomiting, dysphagia.   Allergies  Allergen Reactions  . Hydrocodone Anaphylaxis  . Nsaids     Need to avoid due to stomach ulcers  . Antacid Medicine [Traumeel] Other (See Comments)    High doses of antacids were causing dizziness.  Not sure if she still has a stomach ulcer so her MD d/c'd these meds   Outpatient  Medications Prior to Visit  Medication Sig Dispense Refill  . ALPRAZolam (XANAX) 0.5 MG tablet Take 0.25-0.5 mg by mouth daily as needed for anxiety.    . Cyanocobalamin (B-12-SL) 1000 MCG SUBL Place 1,000 mcg under the tongue every other day.    . ondansetron (ZOFRAN ODT) 4 MG disintegrating tablet Take 1 tablet (4 mg total) by mouth every 8 (eight) hours as needed for nausea. 20 tablet 0   No facility-administered medications prior to visit.    Past Medical History:  Diagnosis Date  . Anal fissure   . Anxiety   . Depression   . History of kidney stones 07/2019   right kidney stone currently  . Stomach ulcer    Past Surgical History:  Procedure Laterality Date  . CHOLECYSTECTOMY N/A 08/25/2019   Procedure: LAPAROSCOPIC CHOLECYSTECTOMY;  Surgeon: Carolan Shiver, MD;  Location: [facility] ORS;  Service: General;  Laterality: N/A;  . ESOPHAGOGASTRODUODENOSCOPY (EGD) WITH PROPOFOL N/A 07/12/2019   Procedure: ESOPHAGOGASTRODUODENOSCOPY (EGD) WITH PROPOFOL;  Surgeon: Christena Deem, MD;  Location: [facility] ENDOSCOPY;  Service: Endoscopy;  Laterality: N/A;  . WISDOM TOOTH EXTRACTION     Social History   Socioeconomic History  . Marital status: Married    Spouse name: Tinnie Gens  . Number of children: 2  . Years of education: Not on file  . Highest education level: Not on file  Occupational History  . Occupation: Theatre stage manager  Social Needs  . Financial resource strain: Not on file  . Food insecurity    Worry: Not on file    Inability: Not on file  . Transportation needs  Medical: Not on file    Non-medical: Not on file  Tobacco Use  . Smoking status: Never Smoker  . Smokeless tobacco: Never Used  Substance and Sexual Activity  . Alcohol use: Yes    Frequency: Never    Comment: occasionally  . Drug use: Never  . Sexual activity: Not on file  Lifestyle  . Physical activity    Days per week: Not on file    Minutes per session: Not on file  . Stress: Not on file   Relationships  . Social Herbalist on phone: Not on file    Gets together: Not on file    Attends religious service: Not on file    Active member of club or organization: Not on file    Attends meetings of clubs or organizations: Not on file    Relationship status: Not on file  Other Topics Concern  . Not on file  Social History Narrative  . Not on file   Family History  Problem Relation Age of Onset  . Pancreatic cancer Maternal Grandfather   . Crohn's disease Maternal Grandfather   . Colon cancer Neg Hx       Review of Systems: Pertinent positive and negative review of systems were noted in the above HPI section. All other review of systems were otherwise negative.    Physical Exam: General: Well developed, well nourished, no acute distress Head: Normocephalic and atraumatic Eyes:  sclerae anicteric, EOMI Ears: Normal auditory acuity Mouth: No deformity or lesions Neck: Supple, no masses or thyromegaly Lungs: Clear throughout to auscultation.  Tenderness over lower left anterior and lower left lateral chest wall Heart: Regular rate and rhythm; no murmurs, rubs or bruits Abdomen: Soft, minimal tenderness at incision sites as expected and non distended. No masses, hepatosplenomegaly or hernias noted. Normal Bowel sounds Rectal: Not done Musculoskeletal: Symmetrical with no gross deformities  Skin: No lesions on visible extremities Pulses:  Normal pulses noted Extremities: No clubbing, cyanosis, edema or deformities noted Neurological: Alert oriented x 4, grossly nonfocal Cervical Nodes:  No significant cervical adenopathy Inguinal Nodes: No significant inguinal adenopathy Psychological:  Alert and cooperative. Anxious.    Assessment and Recommendations:  1. LUQ pain, L chest wall pain, mid chest pain, weight loss, gas, bloating, dyspepsia.  She has had an extensive gastrointestinal evaluation and no additional GI testing is recommended at this time.  Her  left chest wall pain and postsurgical pain need follow up with her PCP and her general surgeon respectively. In the interim begin ibuprofen 400 mg 3 times daily for left chest wall pain and incisional pain.  Gas-X 3 times daily before meals as needed.  Dicyclomine 10 mg AC 3 times daily as needed.  If dicyclomine is not effective then a trial of FDgard 2 p.o. 3 times daily AC as needed. REV in 6 weeks with me or Dr. Hilarie Fredrickson.   2. S/P laparoscopic cholecystectomy.  A number of her symptoms and weight loss were likely related to chronic cholecystitis which has been treated.  Allow adequate time for surgical recovery and reassess symptoms in several weeks. Follow-up as planned with Dr. Windell Moment.  3.  Left renal cysts with further evaluation with MRI is planned.  Follow-up as planned.  4.  Anxiety appears to be contributing to or causing some of her gastrointestinal complaints.  Follow-up with her PCP and her psychiatrist to improve long-term management of anxiety.   cc: Benjaman Kindler, [place],  [place] [phone]

## 2019-09-14 DIAGNOSIS — M5442 Lumbago with sciatica, left side: Secondary | ICD-10-CM | POA: Diagnosis not present

## 2019-09-14 DIAGNOSIS — M531 Cervicobrachial syndrome: Secondary | ICD-10-CM | POA: Diagnosis not present

## 2019-09-14 DIAGNOSIS — M9905 Segmental and somatic dysfunction of pelvic region: Secondary | ICD-10-CM | POA: Diagnosis not present

## 2019-09-14 DIAGNOSIS — M9903 Segmental and somatic dysfunction of lumbar region: Secondary | ICD-10-CM | POA: Diagnosis not present

## 2019-09-14 DIAGNOSIS — M546 Pain in thoracic spine: Secondary | ICD-10-CM | POA: Diagnosis not present

## 2019-09-14 DIAGNOSIS — M9901 Segmental and somatic dysfunction of cervical region: Secondary | ICD-10-CM | POA: Diagnosis not present

## 2019-09-14 DIAGNOSIS — R0782 Intercostal pain: Secondary | ICD-10-CM | POA: Diagnosis not present

## 2019-09-14 DIAGNOSIS — M955 Acquired deformity of pelvis: Secondary | ICD-10-CM | POA: Diagnosis not present

## 2019-09-14 DIAGNOSIS — M9902 Segmental and somatic dysfunction of thoracic region: Secondary | ICD-10-CM | POA: Diagnosis not present

## 2019-09-16 DIAGNOSIS — M9902 Segmental and somatic dysfunction of thoracic region: Secondary | ICD-10-CM | POA: Diagnosis not present

## 2019-09-16 DIAGNOSIS — M5442 Lumbago with sciatica, left side: Secondary | ICD-10-CM | POA: Diagnosis not present

## 2019-09-16 DIAGNOSIS — M955 Acquired deformity of pelvis: Secondary | ICD-10-CM | POA: Diagnosis not present

## 2019-09-16 DIAGNOSIS — M531 Cervicobrachial syndrome: Secondary | ICD-10-CM | POA: Diagnosis not present

## 2019-09-16 DIAGNOSIS — M9905 Segmental and somatic dysfunction of pelvic region: Secondary | ICD-10-CM | POA: Diagnosis not present

## 2019-09-16 DIAGNOSIS — M9901 Segmental and somatic dysfunction of cervical region: Secondary | ICD-10-CM | POA: Diagnosis not present

## 2019-09-16 DIAGNOSIS — R0782 Intercostal pain: Secondary | ICD-10-CM | POA: Diagnosis not present

## 2019-09-16 DIAGNOSIS — M546 Pain in thoracic spine: Secondary | ICD-10-CM | POA: Diagnosis not present

## 2019-09-16 DIAGNOSIS — F411 Generalized anxiety disorder: Secondary | ICD-10-CM | POA: Diagnosis not present

## 2019-09-16 DIAGNOSIS — M9903 Segmental and somatic dysfunction of lumbar region: Secondary | ICD-10-CM | POA: Diagnosis not present

## 2019-09-20 DIAGNOSIS — M5442 Lumbago with sciatica, left side: Secondary | ICD-10-CM | POA: Diagnosis not present

## 2019-09-20 DIAGNOSIS — M9905 Segmental and somatic dysfunction of pelvic region: Secondary | ICD-10-CM | POA: Diagnosis not present

## 2019-09-20 DIAGNOSIS — R0782 Intercostal pain: Secondary | ICD-10-CM | POA: Diagnosis not present

## 2019-09-20 DIAGNOSIS — M531 Cervicobrachial syndrome: Secondary | ICD-10-CM | POA: Diagnosis not present

## 2019-09-20 DIAGNOSIS — M9903 Segmental and somatic dysfunction of lumbar region: Secondary | ICD-10-CM | POA: Diagnosis not present

## 2019-09-20 DIAGNOSIS — M9902 Segmental and somatic dysfunction of thoracic region: Secondary | ICD-10-CM | POA: Diagnosis not present

## 2019-09-20 DIAGNOSIS — M546 Pain in thoracic spine: Secondary | ICD-10-CM | POA: Diagnosis not present

## 2019-09-20 DIAGNOSIS — M955 Acquired deformity of pelvis: Secondary | ICD-10-CM | POA: Diagnosis not present

## 2019-09-20 DIAGNOSIS — M9901 Segmental and somatic dysfunction of cervical region: Secondary | ICD-10-CM | POA: Diagnosis not present

## 2019-09-22 DIAGNOSIS — R0782 Intercostal pain: Secondary | ICD-10-CM | POA: Diagnosis not present

## 2019-09-22 DIAGNOSIS — M546 Pain in thoracic spine: Secondary | ICD-10-CM | POA: Diagnosis not present

## 2019-09-22 DIAGNOSIS — M9901 Segmental and somatic dysfunction of cervical region: Secondary | ICD-10-CM | POA: Diagnosis not present

## 2019-09-22 DIAGNOSIS — M531 Cervicobrachial syndrome: Secondary | ICD-10-CM | POA: Diagnosis not present

## 2019-09-22 DIAGNOSIS — M955 Acquired deformity of pelvis: Secondary | ICD-10-CM | POA: Diagnosis not present

## 2019-09-22 DIAGNOSIS — M9902 Segmental and somatic dysfunction of thoracic region: Secondary | ICD-10-CM | POA: Diagnosis not present

## 2019-09-22 DIAGNOSIS — M9905 Segmental and somatic dysfunction of pelvic region: Secondary | ICD-10-CM | POA: Diagnosis not present

## 2019-09-22 DIAGNOSIS — M9903 Segmental and somatic dysfunction of lumbar region: Secondary | ICD-10-CM | POA: Diagnosis not present

## 2019-09-22 DIAGNOSIS — M5442 Lumbago with sciatica, left side: Secondary | ICD-10-CM | POA: Diagnosis not present

## 2019-09-23 ENCOUNTER — Ambulatory Visit
Admission: RE | Admit: 2019-09-23 | Discharge: 2019-09-23 | Disposition: A | Discharge status: Home / Self Care | Payer: 59 | Source: Ambulatory Visit | Attending: Urology | Admitting: Urology

## 2019-09-23 ENCOUNTER — Other Ambulatory Visit: Payer: Self-pay

## 2019-09-23 DIAGNOSIS — N281 Cyst of kidney, acquired: Secondary | ICD-10-CM | POA: Diagnosis not present

## 2019-09-23 MED ORDER — GADOBUTROL 1 MMOL/ML IV SOLN
7.0000 mL | Freq: Once | INTRAVENOUS | Status: AC | PRN
  Administered 2019-09-23: 09:00:00 7 mL via INTRAVENOUS

## 2019-09-30 DIAGNOSIS — M5442 Lumbago with sciatica, left side: Secondary | ICD-10-CM | POA: Diagnosis not present

## 2019-09-30 DIAGNOSIS — M9901 Segmental and somatic dysfunction of cervical region: Secondary | ICD-10-CM | POA: Diagnosis not present

## 2019-09-30 DIAGNOSIS — M955 Acquired deformity of pelvis: Secondary | ICD-10-CM | POA: Diagnosis not present

## 2019-09-30 DIAGNOSIS — M9903 Segmental and somatic dysfunction of lumbar region: Secondary | ICD-10-CM | POA: Diagnosis not present

## 2019-09-30 DIAGNOSIS — R0782 Intercostal pain: Secondary | ICD-10-CM | POA: Diagnosis not present

## 2019-09-30 DIAGNOSIS — M546 Pain in thoracic spine: Secondary | ICD-10-CM | POA: Diagnosis not present

## 2019-09-30 DIAGNOSIS — M9905 Segmental and somatic dysfunction of pelvic region: Secondary | ICD-10-CM | POA: Diagnosis not present

## 2019-09-30 DIAGNOSIS — M9902 Segmental and somatic dysfunction of thoracic region: Secondary | ICD-10-CM | POA: Diagnosis not present

## 2019-09-30 DIAGNOSIS — M531 Cervicobrachial syndrome: Secondary | ICD-10-CM | POA: Diagnosis not present

## 2019-10-01 ENCOUNTER — Telehealth: Payer: Self-pay | Admitting: Urology

## 2019-10-01 DIAGNOSIS — N281 Cyst of kidney, acquired: Secondary | ICD-10-CM

## 2019-10-01 NOTE — Telephone Encounter (Signed)
Pt called and states that she is returning Dr Cherrie Gauze call for MRI results.

## 2019-10-01 NOTE — Telephone Encounter (Signed)
Call patient discuss MRI results.  MR abd with and without reviewed with patient.   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  Recommend follow-up in 1 year with MRI.  We discussed the Bosniak grading system as well as low risk of malignancy of this lesion, 5% or less however it is definitely a lesion that needs to be followed.  She understands all this.  All questions were answered.  In addition to the above, she reports today that she had episode of gross hematuria that lasted 2 days after the MRI.  It was nonpainful.  She is certain it was in her urine.  I strongly recommended that she come in for cystoscopy.  She reports that she is having severe issues with anxiety and just cannot bring herself to come back to the office at this time.  She understands without cystoscopy, we cannot rule out any bladder lesions tumors or masses.  She ultimately declined the study.  She will call back if she has another episode of gross hematuria or if she reconsiders.  Plan for follow-up in 1 year with MRI.  Hollice Espy, MD

## 2019-10-05 DIAGNOSIS — M531 Cervicobrachial syndrome: Secondary | ICD-10-CM | POA: Diagnosis not present

## 2019-10-05 DIAGNOSIS — M9901 Segmental and somatic dysfunction of cervical region: Secondary | ICD-10-CM | POA: Diagnosis not present

## 2019-10-05 DIAGNOSIS — M9902 Segmental and somatic dysfunction of thoracic region: Secondary | ICD-10-CM | POA: Diagnosis not present

## 2019-10-05 DIAGNOSIS — M9905 Segmental and somatic dysfunction of pelvic region: Secondary | ICD-10-CM | POA: Diagnosis not present

## 2019-10-05 DIAGNOSIS — M955 Acquired deformity of pelvis: Secondary | ICD-10-CM | POA: Diagnosis not present

## 2019-10-05 DIAGNOSIS — M9903 Segmental and somatic dysfunction of lumbar region: Secondary | ICD-10-CM | POA: Diagnosis not present

## 2019-10-05 DIAGNOSIS — R0782 Intercostal pain: Secondary | ICD-10-CM | POA: Diagnosis not present

## 2019-10-05 DIAGNOSIS — M546 Pain in thoracic spine: Secondary | ICD-10-CM | POA: Diagnosis not present

## 2019-10-05 DIAGNOSIS — M5442 Lumbago with sciatica, left side: Secondary | ICD-10-CM | POA: Diagnosis not present

## 2019-10-08 DIAGNOSIS — M531 Cervicobrachial syndrome: Secondary | ICD-10-CM | POA: Diagnosis not present

## 2019-10-08 DIAGNOSIS — M9901 Segmental and somatic dysfunction of cervical region: Secondary | ICD-10-CM | POA: Diagnosis not present

## 2019-10-08 DIAGNOSIS — M955 Acquired deformity of pelvis: Secondary | ICD-10-CM | POA: Diagnosis not present

## 2019-10-08 DIAGNOSIS — M9902 Segmental and somatic dysfunction of thoracic region: Secondary | ICD-10-CM | POA: Diagnosis not present

## 2019-10-08 DIAGNOSIS — M5442 Lumbago with sciatica, left side: Secondary | ICD-10-CM | POA: Diagnosis not present

## 2019-10-08 DIAGNOSIS — M9903 Segmental and somatic dysfunction of lumbar region: Secondary | ICD-10-CM | POA: Diagnosis not present

## 2019-10-08 DIAGNOSIS — R0782 Intercostal pain: Secondary | ICD-10-CM | POA: Diagnosis not present

## 2019-10-08 DIAGNOSIS — M546 Pain in thoracic spine: Secondary | ICD-10-CM | POA: Diagnosis not present

## 2019-10-08 DIAGNOSIS — M9905 Segmental and somatic dysfunction of pelvic region: Secondary | ICD-10-CM | POA: Diagnosis not present

## 2019-10-12 DIAGNOSIS — M9901 Segmental and somatic dysfunction of cervical region: Secondary | ICD-10-CM | POA: Diagnosis not present

## 2019-10-12 DIAGNOSIS — M9903 Segmental and somatic dysfunction of lumbar region: Secondary | ICD-10-CM | POA: Diagnosis not present

## 2019-10-12 DIAGNOSIS — M531 Cervicobrachial syndrome: Secondary | ICD-10-CM | POA: Diagnosis not present

## 2019-10-12 DIAGNOSIS — F419 Anxiety disorder, unspecified: Secondary | ICD-10-CM | POA: Diagnosis not present

## 2019-10-12 DIAGNOSIS — M5442 Lumbago with sciatica, left side: Secondary | ICD-10-CM | POA: Diagnosis not present

## 2019-10-12 DIAGNOSIS — G4700 Insomnia, unspecified: Secondary | ICD-10-CM | POA: Diagnosis not present

## 2019-10-12 DIAGNOSIS — M9902 Segmental and somatic dysfunction of thoracic region: Secondary | ICD-10-CM | POA: Diagnosis not present

## 2019-10-12 DIAGNOSIS — M955 Acquired deformity of pelvis: Secondary | ICD-10-CM | POA: Diagnosis not present

## 2019-10-12 DIAGNOSIS — E639 Nutritional deficiency, unspecified: Secondary | ICD-10-CM | POA: Diagnosis not present

## 2019-10-12 DIAGNOSIS — R42 Dizziness and giddiness: Secondary | ICD-10-CM | POA: Diagnosis not present

## 2019-10-12 DIAGNOSIS — R0782 Intercostal pain: Secondary | ICD-10-CM | POA: Diagnosis not present

## 2019-10-12 DIAGNOSIS — M9905 Segmental and somatic dysfunction of pelvic region: Secondary | ICD-10-CM | POA: Diagnosis not present

## 2019-10-12 DIAGNOSIS — E559 Vitamin D deficiency, unspecified: Secondary | ICD-10-CM | POA: Diagnosis not present

## 2019-10-12 DIAGNOSIS — R110 Nausea: Secondary | ICD-10-CM | POA: Diagnosis not present

## 2019-10-12 DIAGNOSIS — M546 Pain in thoracic spine: Secondary | ICD-10-CM | POA: Diagnosis not present

## 2019-10-15 DIAGNOSIS — M9902 Segmental and somatic dysfunction of thoracic region: Secondary | ICD-10-CM | POA: Diagnosis not present

## 2019-10-15 DIAGNOSIS — M9905 Segmental and somatic dysfunction of pelvic region: Secondary | ICD-10-CM | POA: Diagnosis not present

## 2019-10-15 DIAGNOSIS — M5442 Lumbago with sciatica, left side: Secondary | ICD-10-CM | POA: Diagnosis not present

## 2019-10-15 DIAGNOSIS — M9901 Segmental and somatic dysfunction of cervical region: Secondary | ICD-10-CM | POA: Diagnosis not present

## 2019-10-15 DIAGNOSIS — M955 Acquired deformity of pelvis: Secondary | ICD-10-CM | POA: Diagnosis not present

## 2019-10-15 DIAGNOSIS — M531 Cervicobrachial syndrome: Secondary | ICD-10-CM | POA: Diagnosis not present

## 2019-10-15 DIAGNOSIS — R0782 Intercostal pain: Secondary | ICD-10-CM | POA: Diagnosis not present

## 2019-10-15 DIAGNOSIS — M9903 Segmental and somatic dysfunction of lumbar region: Secondary | ICD-10-CM | POA: Diagnosis not present

## 2019-10-15 DIAGNOSIS — M546 Pain in thoracic spine: Secondary | ICD-10-CM | POA: Diagnosis not present

## 2019-10-19 DIAGNOSIS — M9902 Segmental and somatic dysfunction of thoracic region: Secondary | ICD-10-CM | POA: Diagnosis not present

## 2019-10-19 DIAGNOSIS — M9905 Segmental and somatic dysfunction of pelvic region: Secondary | ICD-10-CM | POA: Diagnosis not present

## 2019-10-19 DIAGNOSIS — M531 Cervicobrachial syndrome: Secondary | ICD-10-CM | POA: Diagnosis not present

## 2019-10-19 DIAGNOSIS — M9903 Segmental and somatic dysfunction of lumbar region: Secondary | ICD-10-CM | POA: Diagnosis not present

## 2019-10-19 DIAGNOSIS — M5442 Lumbago with sciatica, left side: Secondary | ICD-10-CM | POA: Diagnosis not present

## 2019-10-19 DIAGNOSIS — R0782 Intercostal pain: Secondary | ICD-10-CM | POA: Diagnosis not present

## 2019-10-19 DIAGNOSIS — M955 Acquired deformity of pelvis: Secondary | ICD-10-CM | POA: Diagnosis not present

## 2019-10-19 DIAGNOSIS — M9901 Segmental and somatic dysfunction of cervical region: Secondary | ICD-10-CM | POA: Diagnosis not present

## 2019-10-19 DIAGNOSIS — M546 Pain in thoracic spine: Secondary | ICD-10-CM | POA: Diagnosis not present

## 2019-10-20 ENCOUNTER — Encounter: Payer: Self-pay | Admitting: Gastroenterology

## 2019-10-20 ENCOUNTER — Other Ambulatory Visit: Payer: Self-pay

## 2019-10-20 ENCOUNTER — Ambulatory Visit: Payer: 59 | Admitting: Gastroenterology

## 2019-10-20 VITALS — BP 100/70 | HR 56 | Temp 98.2°F | Ht 62.0 in | Wt 164.4 lb

## 2019-10-20 DIAGNOSIS — K2960 Other gastritis without bleeding: Secondary | ICD-10-CM

## 2019-10-20 DIAGNOSIS — R079 Chest pain, unspecified: Secondary | ICD-10-CM

## 2019-10-20 NOTE — Progress Notes (Signed)
    History of Present Illness: This is a 28 year old female returning for follow-up of dyspeptic symptoms left-sided chest pain.  She states she has been diagnosed with anxiety and panic attacks and has started on medications recently and has been feeling better.  She is seeing a chiropractor with neck and chest manipulations hurt chest and neck pain is resolving.  She occasionally has mild indigestion symptoms.  EGD performed in July revealed erosive gastritis.  She feels she has recovered well from her cholecystectomy.  Current Medications, Allergies, Past Medical History, Past Surgical History, Family History and Social History were reviewed in [employer] record.   Physical Exam: General: Well developed, well nourished, no acute distress Head: Normocephalic and atraumatic Eyes:  sclerae anicteric, EOMI Ears: Normal auditory acuity Mouth: No deformity or lesions Lungs: Clear throughout to auscultation Heart: Regular rate and rhythm; no murmurs, rubs or bruits Abdomen: Soft, non tender and non distended. No masses, hepatosplenomegaly or hernias noted. Normal Bowel sounds Rectal: Not done Musculoskeletal: Symmetrical with no gross deformities  Pulses:  Normal pulses noted Extremities: No clubbing, cyanosis, edema or deformities noted Neurological: Alert oriented x 4, grossly nonfocal Psychological:  Alert and cooperative. Normal mood and affect   Assessment and Recommendations:  1. L chest pain resolving with chiropractor.   2. Erosive gastritis, dyspepsia. Nexium 20 mg po qd for 1 month and then qd prn. GI follow up prn.   3. S/P lap cholecystectomy.   4. L renal cysts. Follow up with PCP.   5. Anxiety, panic disorder. Under active treatment now. Marland Kitchen

## 2019-10-20 NOTE — Patient Instructions (Signed)
Take Nexium over the counter daily as needed.   Thank you for choosing me and [facility].  Pricilla Riffle. Dagoberto Ligas., MD., Marval Regal

## 2019-10-21 DIAGNOSIS — R0782 Intercostal pain: Secondary | ICD-10-CM | POA: Diagnosis not present

## 2019-10-21 DIAGNOSIS — M531 Cervicobrachial syndrome: Secondary | ICD-10-CM | POA: Diagnosis not present

## 2019-10-21 DIAGNOSIS — H6981 Other specified disorders of Eustachian tube, right ear: Secondary | ICD-10-CM | POA: Diagnosis not present

## 2019-10-21 DIAGNOSIS — M5442 Lumbago with sciatica, left side: Secondary | ICD-10-CM | POA: Diagnosis not present

## 2019-10-21 DIAGNOSIS — M546 Pain in thoracic spine: Secondary | ICD-10-CM | POA: Diagnosis not present

## 2019-10-21 DIAGNOSIS — M9905 Segmental and somatic dysfunction of pelvic region: Secondary | ICD-10-CM | POA: Diagnosis not present

## 2019-10-21 DIAGNOSIS — H9311 Tinnitus, right ear: Secondary | ICD-10-CM | POA: Diagnosis not present

## 2019-10-21 DIAGNOSIS — R42 Dizziness and giddiness: Secondary | ICD-10-CM | POA: Diagnosis not present

## 2019-10-21 DIAGNOSIS — M955 Acquired deformity of pelvis: Secondary | ICD-10-CM | POA: Diagnosis not present

## 2019-10-21 DIAGNOSIS — M9901 Segmental and somatic dysfunction of cervical region: Secondary | ICD-10-CM | POA: Diagnosis not present

## 2019-10-21 DIAGNOSIS — M9902 Segmental and somatic dysfunction of thoracic region: Secondary | ICD-10-CM | POA: Diagnosis not present

## 2019-10-21 DIAGNOSIS — M9903 Segmental and somatic dysfunction of lumbar region: Secondary | ICD-10-CM | POA: Diagnosis not present

## 2019-10-22 DIAGNOSIS — M9903 Segmental and somatic dysfunction of lumbar region: Secondary | ICD-10-CM | POA: Diagnosis not present

## 2019-10-22 DIAGNOSIS — M955 Acquired deformity of pelvis: Secondary | ICD-10-CM | POA: Diagnosis not present

## 2019-10-22 DIAGNOSIS — M531 Cervicobrachial syndrome: Secondary | ICD-10-CM | POA: Diagnosis not present

## 2019-10-22 DIAGNOSIS — M9905 Segmental and somatic dysfunction of pelvic region: Secondary | ICD-10-CM | POA: Diagnosis not present

## 2019-10-22 DIAGNOSIS — M5442 Lumbago with sciatica, left side: Secondary | ICD-10-CM | POA: Diagnosis not present

## 2019-10-22 DIAGNOSIS — M9902 Segmental and somatic dysfunction of thoracic region: Secondary | ICD-10-CM | POA: Diagnosis not present

## 2019-10-22 DIAGNOSIS — R0782 Intercostal pain: Secondary | ICD-10-CM | POA: Diagnosis not present

## 2019-10-22 DIAGNOSIS — M9901 Segmental and somatic dysfunction of cervical region: Secondary | ICD-10-CM | POA: Diagnosis not present

## 2019-10-22 DIAGNOSIS — M546 Pain in thoracic spine: Secondary | ICD-10-CM | POA: Diagnosis not present

## 2019-10-26 DIAGNOSIS — M9902 Segmental and somatic dysfunction of thoracic region: Secondary | ICD-10-CM | POA: Diagnosis not present

## 2019-10-26 DIAGNOSIS — M9903 Segmental and somatic dysfunction of lumbar region: Secondary | ICD-10-CM | POA: Diagnosis not present

## 2019-10-26 DIAGNOSIS — M5442 Lumbago with sciatica, left side: Secondary | ICD-10-CM | POA: Diagnosis not present

## 2019-10-26 DIAGNOSIS — M9905 Segmental and somatic dysfunction of pelvic region: Secondary | ICD-10-CM | POA: Diagnosis not present

## 2019-10-26 DIAGNOSIS — M9901 Segmental and somatic dysfunction of cervical region: Secondary | ICD-10-CM | POA: Diagnosis not present

## 2019-10-26 DIAGNOSIS — M531 Cervicobrachial syndrome: Secondary | ICD-10-CM | POA: Diagnosis not present

## 2019-10-26 DIAGNOSIS — M955 Acquired deformity of pelvis: Secondary | ICD-10-CM | POA: Diagnosis not present

## 2019-10-26 DIAGNOSIS — R0782 Intercostal pain: Secondary | ICD-10-CM | POA: Diagnosis not present

## 2019-10-26 DIAGNOSIS — M546 Pain in thoracic spine: Secondary | ICD-10-CM | POA: Diagnosis not present

## 2019-10-27 DIAGNOSIS — F419 Anxiety disorder, unspecified: Secondary | ICD-10-CM | POA: Diagnosis not present

## 2019-10-27 DIAGNOSIS — N926 Irregular menstruation, unspecified: Secondary | ICD-10-CM | POA: Diagnosis not present

## 2019-10-28 DIAGNOSIS — M9905 Segmental and somatic dysfunction of pelvic region: Secondary | ICD-10-CM | POA: Diagnosis not present

## 2019-10-28 DIAGNOSIS — M531 Cervicobrachial syndrome: Secondary | ICD-10-CM | POA: Diagnosis not present

## 2019-10-28 DIAGNOSIS — R0782 Intercostal pain: Secondary | ICD-10-CM | POA: Diagnosis not present

## 2019-10-28 DIAGNOSIS — M5442 Lumbago with sciatica, left side: Secondary | ICD-10-CM | POA: Diagnosis not present

## 2019-10-28 DIAGNOSIS — M955 Acquired deformity of pelvis: Secondary | ICD-10-CM | POA: Diagnosis not present

## 2019-10-28 DIAGNOSIS — M9903 Segmental and somatic dysfunction of lumbar region: Secondary | ICD-10-CM | POA: Diagnosis not present

## 2019-10-28 DIAGNOSIS — M9902 Segmental and somatic dysfunction of thoracic region: Secondary | ICD-10-CM | POA: Diagnosis not present

## 2019-10-28 DIAGNOSIS — M9901 Segmental and somatic dysfunction of cervical region: Secondary | ICD-10-CM | POA: Diagnosis not present

## 2019-10-28 DIAGNOSIS — M546 Pain in thoracic spine: Secondary | ICD-10-CM | POA: Diagnosis not present

## 2019-10-29 DIAGNOSIS — F410 Panic disorder [episodic paroxysmal anxiety] without agoraphobia: Secondary | ICD-10-CM | POA: Diagnosis not present

## 2019-10-29 DIAGNOSIS — M546 Pain in thoracic spine: Secondary | ICD-10-CM | POA: Diagnosis not present

## 2019-10-29 DIAGNOSIS — F4010 Social phobia, unspecified: Secondary | ICD-10-CM | POA: Diagnosis not present

## 2019-10-29 DIAGNOSIS — M9902 Segmental and somatic dysfunction of thoracic region: Secondary | ICD-10-CM | POA: Diagnosis not present

## 2019-10-29 DIAGNOSIS — M9901 Segmental and somatic dysfunction of cervical region: Secondary | ICD-10-CM | POA: Diagnosis not present

## 2019-10-29 DIAGNOSIS — M955 Acquired deformity of pelvis: Secondary | ICD-10-CM | POA: Diagnosis not present

## 2019-10-29 DIAGNOSIS — M5442 Lumbago with sciatica, left side: Secondary | ICD-10-CM | POA: Diagnosis not present

## 2019-10-29 DIAGNOSIS — M531 Cervicobrachial syndrome: Secondary | ICD-10-CM | POA: Diagnosis not present

## 2019-10-29 DIAGNOSIS — M9905 Segmental and somatic dysfunction of pelvic region: Secondary | ICD-10-CM | POA: Diagnosis not present

## 2019-10-29 DIAGNOSIS — M9903 Segmental and somatic dysfunction of lumbar region: Secondary | ICD-10-CM | POA: Diagnosis not present

## 2019-10-29 DIAGNOSIS — R0782 Intercostal pain: Secondary | ICD-10-CM | POA: Diagnosis not present

## 2019-11-01 DIAGNOSIS — M5442 Lumbago with sciatica, left side: Secondary | ICD-10-CM | POA: Diagnosis not present

## 2019-11-01 DIAGNOSIS — M9902 Segmental and somatic dysfunction of thoracic region: Secondary | ICD-10-CM | POA: Diagnosis not present

## 2019-11-01 DIAGNOSIS — M9901 Segmental and somatic dysfunction of cervical region: Secondary | ICD-10-CM | POA: Diagnosis not present

## 2019-11-01 DIAGNOSIS — M546 Pain in thoracic spine: Secondary | ICD-10-CM | POA: Diagnosis not present

## 2019-11-01 DIAGNOSIS — M9903 Segmental and somatic dysfunction of lumbar region: Secondary | ICD-10-CM | POA: Diagnosis not present

## 2019-11-01 DIAGNOSIS — M9905 Segmental and somatic dysfunction of pelvic region: Secondary | ICD-10-CM | POA: Diagnosis not present

## 2019-11-01 DIAGNOSIS — M955 Acquired deformity of pelvis: Secondary | ICD-10-CM | POA: Diagnosis not present

## 2019-11-01 DIAGNOSIS — M531 Cervicobrachial syndrome: Secondary | ICD-10-CM | POA: Diagnosis not present

## 2019-11-01 DIAGNOSIS — R0782 Intercostal pain: Secondary | ICD-10-CM | POA: Diagnosis not present

## 2019-11-02 ENCOUNTER — Other Ambulatory Visit: Payer: Self-pay

## 2019-11-02 DIAGNOSIS — Z20822 Contact with and (suspected) exposure to covid-19: Secondary | ICD-10-CM

## 2019-11-03 LAB — NOVEL CORONAVIRUS, NAA: SARS-CoV-2, NAA: NOT DETECTED

## 2019-11-05 DIAGNOSIS — M9903 Segmental and somatic dysfunction of lumbar region: Secondary | ICD-10-CM | POA: Diagnosis not present

## 2019-11-05 DIAGNOSIS — M546 Pain in thoracic spine: Secondary | ICD-10-CM | POA: Diagnosis not present

## 2019-11-05 DIAGNOSIS — M9901 Segmental and somatic dysfunction of cervical region: Secondary | ICD-10-CM | POA: Diagnosis not present

## 2019-11-05 DIAGNOSIS — M9902 Segmental and somatic dysfunction of thoracic region: Secondary | ICD-10-CM | POA: Diagnosis not present

## 2019-11-05 DIAGNOSIS — M9905 Segmental and somatic dysfunction of pelvic region: Secondary | ICD-10-CM | POA: Diagnosis not present

## 2019-11-05 DIAGNOSIS — R0782 Intercostal pain: Secondary | ICD-10-CM | POA: Diagnosis not present

## 2019-11-05 DIAGNOSIS — M955 Acquired deformity of pelvis: Secondary | ICD-10-CM | POA: Diagnosis not present

## 2019-11-05 DIAGNOSIS — M531 Cervicobrachial syndrome: Secondary | ICD-10-CM | POA: Diagnosis not present

## 2019-11-05 DIAGNOSIS — M5442 Lumbago with sciatica, left side: Secondary | ICD-10-CM | POA: Diagnosis not present

## 2019-11-09 DIAGNOSIS — F418 Other specified anxiety disorders: Secondary | ICD-10-CM | POA: Diagnosis not present

## 2019-11-09 DIAGNOSIS — F410 Panic disorder [episodic paroxysmal anxiety] without agoraphobia: Secondary | ICD-10-CM | POA: Diagnosis not present

## 2019-11-09 DIAGNOSIS — F419 Anxiety disorder, unspecified: Secondary | ICD-10-CM | POA: Diagnosis not present

## 2019-11-12 DIAGNOSIS — M955 Acquired deformity of pelvis: Secondary | ICD-10-CM | POA: Diagnosis not present

## 2019-11-12 DIAGNOSIS — M9903 Segmental and somatic dysfunction of lumbar region: Secondary | ICD-10-CM | POA: Diagnosis not present

## 2019-11-12 DIAGNOSIS — M9901 Segmental and somatic dysfunction of cervical region: Secondary | ICD-10-CM | POA: Diagnosis not present

## 2019-11-12 DIAGNOSIS — M9905 Segmental and somatic dysfunction of pelvic region: Secondary | ICD-10-CM | POA: Diagnosis not present

## 2019-11-12 DIAGNOSIS — M531 Cervicobrachial syndrome: Secondary | ICD-10-CM | POA: Diagnosis not present

## 2019-11-12 DIAGNOSIS — M9902 Segmental and somatic dysfunction of thoracic region: Secondary | ICD-10-CM | POA: Diagnosis not present

## 2019-11-12 DIAGNOSIS — R0782 Intercostal pain: Secondary | ICD-10-CM | POA: Diagnosis not present

## 2019-11-12 DIAGNOSIS — M5442 Lumbago with sciatica, left side: Secondary | ICD-10-CM | POA: Diagnosis not present

## 2019-11-12 DIAGNOSIS — M546 Pain in thoracic spine: Secondary | ICD-10-CM | POA: Diagnosis not present

## 2019-11-16 DIAGNOSIS — M546 Pain in thoracic spine: Secondary | ICD-10-CM | POA: Diagnosis not present

## 2019-11-16 DIAGNOSIS — M9903 Segmental and somatic dysfunction of lumbar region: Secondary | ICD-10-CM | POA: Diagnosis not present

## 2019-11-16 DIAGNOSIS — M5442 Lumbago with sciatica, left side: Secondary | ICD-10-CM | POA: Diagnosis not present

## 2019-11-16 DIAGNOSIS — M531 Cervicobrachial syndrome: Secondary | ICD-10-CM | POA: Diagnosis not present

## 2019-11-16 DIAGNOSIS — R0782 Intercostal pain: Secondary | ICD-10-CM | POA: Diagnosis not present

## 2019-11-16 DIAGNOSIS — M955 Acquired deformity of pelvis: Secondary | ICD-10-CM | POA: Diagnosis not present

## 2019-11-16 DIAGNOSIS — M9901 Segmental and somatic dysfunction of cervical region: Secondary | ICD-10-CM | POA: Diagnosis not present

## 2019-11-16 DIAGNOSIS — M9902 Segmental and somatic dysfunction of thoracic region: Secondary | ICD-10-CM | POA: Diagnosis not present

## 2019-11-16 DIAGNOSIS — M9905 Segmental and somatic dysfunction of pelvic region: Secondary | ICD-10-CM | POA: Diagnosis not present

## 2019-11-17 DIAGNOSIS — R079 Chest pain, unspecified: Secondary | ICD-10-CM | POA: Diagnosis not present

## 2019-11-17 DIAGNOSIS — R519 Headache, unspecified: Secondary | ICD-10-CM | POA: Diagnosis not present

## 2019-11-17 DIAGNOSIS — R9431 Abnormal electrocardiogram [ECG] [EKG]: Secondary | ICD-10-CM | POA: Diagnosis not present

## 2019-11-18 DIAGNOSIS — F451 Undifferentiated somatoform disorder: Secondary | ICD-10-CM | POA: Diagnosis not present

## 2019-11-18 DIAGNOSIS — F3342 Major depressive disorder, recurrent, in full remission: Secondary | ICD-10-CM | POA: Diagnosis not present

## 2019-11-22 DIAGNOSIS — M531 Cervicobrachial syndrome: Secondary | ICD-10-CM | POA: Diagnosis not present

## 2019-11-22 DIAGNOSIS — R0782 Intercostal pain: Secondary | ICD-10-CM | POA: Diagnosis not present

## 2019-11-22 DIAGNOSIS — M955 Acquired deformity of pelvis: Secondary | ICD-10-CM | POA: Diagnosis not present

## 2019-11-22 DIAGNOSIS — M9903 Segmental and somatic dysfunction of lumbar region: Secondary | ICD-10-CM | POA: Diagnosis not present

## 2019-11-22 DIAGNOSIS — M5442 Lumbago with sciatica, left side: Secondary | ICD-10-CM | POA: Diagnosis not present

## 2019-11-22 DIAGNOSIS — M9905 Segmental and somatic dysfunction of pelvic region: Secondary | ICD-10-CM | POA: Diagnosis not present

## 2019-11-22 DIAGNOSIS — M546 Pain in thoracic spine: Secondary | ICD-10-CM | POA: Diagnosis not present

## 2019-11-22 DIAGNOSIS — M9902 Segmental and somatic dysfunction of thoracic region: Secondary | ICD-10-CM | POA: Diagnosis not present

## 2019-11-22 DIAGNOSIS — M9901 Segmental and somatic dysfunction of cervical region: Secondary | ICD-10-CM | POA: Diagnosis not present

## 2019-11-29 DIAGNOSIS — M9901 Segmental and somatic dysfunction of cervical region: Secondary | ICD-10-CM | POA: Diagnosis not present

## 2019-11-29 DIAGNOSIS — R0782 Intercostal pain: Secondary | ICD-10-CM | POA: Diagnosis not present

## 2019-11-29 DIAGNOSIS — M546 Pain in thoracic spine: Secondary | ICD-10-CM | POA: Diagnosis not present

## 2019-11-29 DIAGNOSIS — M955 Acquired deformity of pelvis: Secondary | ICD-10-CM | POA: Diagnosis not present

## 2019-11-29 DIAGNOSIS — M9903 Segmental and somatic dysfunction of lumbar region: Secondary | ICD-10-CM | POA: Diagnosis not present

## 2019-11-29 DIAGNOSIS — M9902 Segmental and somatic dysfunction of thoracic region: Secondary | ICD-10-CM | POA: Diagnosis not present

## 2019-11-29 DIAGNOSIS — M5442 Lumbago with sciatica, left side: Secondary | ICD-10-CM | POA: Diagnosis not present

## 2019-11-29 DIAGNOSIS — M531 Cervicobrachial syndrome: Secondary | ICD-10-CM | POA: Diagnosis not present

## 2019-11-29 DIAGNOSIS — M9905 Segmental and somatic dysfunction of pelvic region: Secondary | ICD-10-CM | POA: Diagnosis not present

## 2019-12-03 DIAGNOSIS — M955 Acquired deformity of pelvis: Secondary | ICD-10-CM | POA: Diagnosis not present

## 2019-12-03 DIAGNOSIS — M9905 Segmental and somatic dysfunction of pelvic region: Secondary | ICD-10-CM | POA: Diagnosis not present

## 2019-12-03 DIAGNOSIS — M9901 Segmental and somatic dysfunction of cervical region: Secondary | ICD-10-CM | POA: Diagnosis not present

## 2019-12-03 DIAGNOSIS — M546 Pain in thoracic spine: Secondary | ICD-10-CM | POA: Diagnosis not present

## 2019-12-03 DIAGNOSIS — M5442 Lumbago with sciatica, left side: Secondary | ICD-10-CM | POA: Diagnosis not present

## 2019-12-03 DIAGNOSIS — M9903 Segmental and somatic dysfunction of lumbar region: Secondary | ICD-10-CM | POA: Diagnosis not present

## 2019-12-03 DIAGNOSIS — M531 Cervicobrachial syndrome: Secondary | ICD-10-CM | POA: Diagnosis not present

## 2019-12-03 DIAGNOSIS — M9902 Segmental and somatic dysfunction of thoracic region: Secondary | ICD-10-CM | POA: Diagnosis not present

## 2019-12-03 DIAGNOSIS — R0782 Intercostal pain: Secondary | ICD-10-CM | POA: Diagnosis not present

## 2019-12-07 DIAGNOSIS — M531 Cervicobrachial syndrome: Secondary | ICD-10-CM | POA: Diagnosis not present

## 2019-12-07 DIAGNOSIS — M9901 Segmental and somatic dysfunction of cervical region: Secondary | ICD-10-CM | POA: Diagnosis not present

## 2019-12-07 DIAGNOSIS — M9905 Segmental and somatic dysfunction of pelvic region: Secondary | ICD-10-CM | POA: Diagnosis not present

## 2019-12-07 DIAGNOSIS — M9902 Segmental and somatic dysfunction of thoracic region: Secondary | ICD-10-CM | POA: Diagnosis not present

## 2019-12-07 DIAGNOSIS — M5442 Lumbago with sciatica, left side: Secondary | ICD-10-CM | POA: Diagnosis not present

## 2019-12-07 DIAGNOSIS — M955 Acquired deformity of pelvis: Secondary | ICD-10-CM | POA: Diagnosis not present

## 2019-12-07 DIAGNOSIS — R0782 Intercostal pain: Secondary | ICD-10-CM | POA: Diagnosis not present

## 2019-12-07 DIAGNOSIS — M546 Pain in thoracic spine: Secondary | ICD-10-CM | POA: Diagnosis not present

## 2019-12-07 DIAGNOSIS — M9903 Segmental and somatic dysfunction of lumbar region: Secondary | ICD-10-CM | POA: Diagnosis not present

## 2019-12-10 DIAGNOSIS — M546 Pain in thoracic spine: Secondary | ICD-10-CM | POA: Diagnosis not present

## 2019-12-10 DIAGNOSIS — M9902 Segmental and somatic dysfunction of thoracic region: Secondary | ICD-10-CM | POA: Diagnosis not present

## 2019-12-10 DIAGNOSIS — R0782 Intercostal pain: Secondary | ICD-10-CM | POA: Diagnosis not present

## 2019-12-10 DIAGNOSIS — M9901 Segmental and somatic dysfunction of cervical region: Secondary | ICD-10-CM | POA: Diagnosis not present

## 2019-12-10 DIAGNOSIS — M955 Acquired deformity of pelvis: Secondary | ICD-10-CM | POA: Diagnosis not present

## 2019-12-10 DIAGNOSIS — M9903 Segmental and somatic dysfunction of lumbar region: Secondary | ICD-10-CM | POA: Diagnosis not present

## 2019-12-10 DIAGNOSIS — M5442 Lumbago with sciatica, left side: Secondary | ICD-10-CM | POA: Diagnosis not present

## 2019-12-10 DIAGNOSIS — M9905 Segmental and somatic dysfunction of pelvic region: Secondary | ICD-10-CM | POA: Diagnosis not present

## 2019-12-10 DIAGNOSIS — M531 Cervicobrachial syndrome: Secondary | ICD-10-CM | POA: Diagnosis not present

## 2019-12-17 DIAGNOSIS — M9903 Segmental and somatic dysfunction of lumbar region: Secondary | ICD-10-CM | POA: Diagnosis not present

## 2019-12-17 DIAGNOSIS — M546 Pain in thoracic spine: Secondary | ICD-10-CM | POA: Diagnosis not present

## 2019-12-17 DIAGNOSIS — M9901 Segmental and somatic dysfunction of cervical region: Secondary | ICD-10-CM | POA: Diagnosis not present

## 2019-12-17 DIAGNOSIS — M5442 Lumbago with sciatica, left side: Secondary | ICD-10-CM | POA: Diagnosis not present

## 2019-12-17 DIAGNOSIS — R0782 Intercostal pain: Secondary | ICD-10-CM | POA: Diagnosis not present

## 2019-12-17 DIAGNOSIS — M9902 Segmental and somatic dysfunction of thoracic region: Secondary | ICD-10-CM | POA: Diagnosis not present

## 2019-12-17 DIAGNOSIS — M9905 Segmental and somatic dysfunction of pelvic region: Secondary | ICD-10-CM | POA: Diagnosis not present

## 2019-12-17 DIAGNOSIS — M955 Acquired deformity of pelvis: Secondary | ICD-10-CM | POA: Diagnosis not present

## 2019-12-17 DIAGNOSIS — M531 Cervicobrachial syndrome: Secondary | ICD-10-CM | POA: Diagnosis not present

## 2019-12-27 DIAGNOSIS — M531 Cervicobrachial syndrome: Secondary | ICD-10-CM | POA: Diagnosis not present

## 2019-12-27 DIAGNOSIS — R0782 Intercostal pain: Secondary | ICD-10-CM | POA: Diagnosis not present

## 2019-12-27 DIAGNOSIS — M955 Acquired deformity of pelvis: Secondary | ICD-10-CM | POA: Diagnosis not present

## 2019-12-27 DIAGNOSIS — M9902 Segmental and somatic dysfunction of thoracic region: Secondary | ICD-10-CM | POA: Diagnosis not present

## 2019-12-27 DIAGNOSIS — M9903 Segmental and somatic dysfunction of lumbar region: Secondary | ICD-10-CM | POA: Diagnosis not present

## 2019-12-27 DIAGNOSIS — M9901 Segmental and somatic dysfunction of cervical region: Secondary | ICD-10-CM | POA: Diagnosis not present

## 2019-12-27 DIAGNOSIS — M5442 Lumbago with sciatica, left side: Secondary | ICD-10-CM | POA: Diagnosis not present

## 2019-12-27 DIAGNOSIS — M546 Pain in thoracic spine: Secondary | ICD-10-CM | POA: Diagnosis not present

## 2019-12-27 DIAGNOSIS — M9905 Segmental and somatic dysfunction of pelvic region: Secondary | ICD-10-CM | POA: Diagnosis not present

## 2020-01-02 DIAGNOSIS — R630 Anorexia: Secondary | ICD-10-CM | POA: Diagnosis not present

## 2020-01-02 DIAGNOSIS — R1030 Lower abdominal pain, unspecified: Secondary | ICD-10-CM | POA: Diagnosis not present

## 2020-01-02 DIAGNOSIS — N281 Cyst of kidney, acquired: Secondary | ICD-10-CM | POA: Diagnosis not present

## 2020-01-02 DIAGNOSIS — Z8711 Personal history of peptic ulcer disease: Secondary | ICD-10-CM | POA: Diagnosis not present

## 2020-01-02 DIAGNOSIS — R519 Headache, unspecified: Secondary | ICD-10-CM | POA: Diagnosis not present

## 2020-01-02 DIAGNOSIS — R42 Dizziness and giddiness: Secondary | ICD-10-CM | POA: Diagnosis not present

## 2020-01-02 DIAGNOSIS — N200 Calculus of kidney: Secondary | ICD-10-CM | POA: Diagnosis not present

## 2020-01-02 DIAGNOSIS — R1031 Right lower quadrant pain: Secondary | ICD-10-CM | POA: Diagnosis not present

## 2020-01-02 DIAGNOSIS — K219 Gastro-esophageal reflux disease without esophagitis: Secondary | ICD-10-CM | POA: Diagnosis not present

## 2020-01-02 DIAGNOSIS — R110 Nausea: Secondary | ICD-10-CM | POA: Diagnosis not present

## 2020-01-02 NOTE — Progress Notes (Signed)
NEUROLOGY CONSULTATION NOTE  Barbara Cole MRN: [id number] DOB: 12/06/1991  Referring provider: Christeen Douglas, MD Primary care provider: Christeen Douglas, MD  Reason for consult:  dizziness  HISTORY OF PRESENT ILLNESS: Barbara Cole is a 29 year old female with history of anxiety and depression who presents for dizziness.  History supplemented by referring provider note.  In March, she was started on Zoloft for anxiety.  In June, she woke up in the middle of the night and woke up with severe abdominal pain.  Gastric ulcer was suspected and she was advised to discontinue sertraline.  Soon afterward, she developed dizziness, described as spinning sensation when looking down or up or triggered by anxiety.  While walking, she felt like leaning to the right side.  Symptoms were intermittent.  Anxiety increased and she was started on Wellbutrin but dizziness persisted.  She presented to the ED at [facility] on 08/15/2019 for right shoulder heaviness and intermittent weakness in her legs, as well as dizziness.  MRI of brain with and without contrast was performed to evaluate for MS, which was personally reviewed and was normal.  She started experiencing left sided headache with associated soreness to touch.  She restarted sertraline in October and started feeling better.  However, she saw a holistic provider who recommended discontinuing it.  Symptoms returned.  She was evaluated by ENT in October for her dizziness and tinnitus and exam was unremarkable.  She was prescribed alprazolam for anxiety, which helps abort her symptoms.  For recurrent abdominal pain, she underwent cholecystectomy in August.  Gastroenterology told her that she does not have ulcers and she may resume sertraline.  PAST MEDICAL HISTORY: Past Medical History:  Diagnosis Date  . Anal fissure   . Anxiety   . Depression   . History of kidney stones 07/2019   right kidney stone currently  . Stomach ulcer     PAST SURGICAL  HISTORY: Past Surgical History:  Procedure Laterality Date  . CHOLECYSTECTOMY N/A 08/25/2019   Procedure: LAPAROSCOPIC CHOLECYSTECTOMY;  Surgeon: Carolan Shiver, MD;  Location: [facility] ORS;  Service: General;  Laterality: N/A;  . ESOPHAGOGASTRODUODENOSCOPY (EGD) WITH PROPOFOL N/A 07/12/2019   Procedure: ESOPHAGOGASTRODUODENOSCOPY (EGD) WITH PROPOFOL;  Surgeon: Christena Deem, MD;  Location: [facility] ENDOSCOPY;  Service: Endoscopy;  Laterality: N/A;  . WISDOM TOOTH EXTRACTION      MEDICATIONS: Current Outpatient Medications on File Prior to Visit  Medication Sig Dispense Refill  . ALPRAZolam (XANAX) 0.25 MG tablet Take 1 tablet by mouth 3 (three) times daily as needed.    . Cyanocobalamin (B-12-SL) 1000 MCG SUBL Place 1,000 mcg under the tongue every other day.    . dicyclomine (BENTYL) 10 MG capsule Take 1 capsule (10 mg total) by mouth 3 (three) times daily before meals. (Patient not taking: Reported on 10/20/2019) 90 capsule 11  . ondansetron (ZOFRAN ODT) 4 MG disintegrating tablet Take 1 tablet (4 mg total) by mouth every 8 (eight) hours as needed for nausea. (Patient not taking: Reported on 10/20/2019) 20 tablet 0  . sertraline (ZOLOFT) 100 MG tablet Take 100 mg by mouth daily.    . traZODone (DESYREL) 50 MG tablet Take 3 tablets by mouth at bedtime as needed.     No current facility-administered medications on file prior to visit.    ALLERGIES: Allergies  Allergen Reactions  . Hydrocodone Anaphylaxis  . Antacid Medicine [Traumeel] Other (See Comments)    High doses of antacids were causing dizziness.  Not sure if she still  has a stomach ulcer so her MD d/c'd these meds    FAMILY HISTORY: Family History  Problem Relation Age of Onset  . Pancreatic cancer Maternal Grandfather   . Crohn's disease Maternal Grandfather   . Colon cancer Neg Hx   .  SOCIAL HISTORY: Social History   Socioeconomic History  . Marital status: Married    Spouse name: Barbara Cole  . Number of  children: 2  . Years of education: Not on file  . Highest education level: Not on file  Occupational History  . Occupation: Solicitor  Tobacco Use  . Smoking status: Never Smoker  . Smokeless tobacco: Never Used  Substance and Sexual Activity  . Alcohol use: Yes    Comment: occasionally  . Drug use: Never  . Sexual activity: Not on file  Other Topics Concern  . Not on file  Social History Narrative   Right handed   Social Determinants of Health   Financial Resource Strain:   . Difficulty of Paying Living Expenses: Not on file  Food Insecurity:   . Worried About Charity fundraiser in the Last Year: Not on file  . Ran Out of Food in the Last Year: Not on file  Transportation Needs:   . Lack of Transportation (Medical): Not on file  . Lack of Transportation (Non-Medical): Not on file  Physical Activity:   . Days of Exercise per Week: Not on file  . Minutes of Exercise per Session: Not on file  Stress:   . Feeling of Stress : Not on file  Social Connections:   . Frequency of Communication with Friends and Family: Not on file  . Frequency of Social Gatherings with Friends and Family: Not on file  . Attends Religious Services: Not on file  . Active Member of Clubs or Organizations: Not on file  . Attends Archivist Meetings: Not on file  . Marital Status: Not on file  Intimate Partner Violence:   . Fear of Current or Ex-Partner: Not on file  . Emotionally Abused: Not on file  . Physically Abused: Not on file  . Sexually Abused: Not on file    REVIEW OF SYSTEMS: Constitutional: No fevers, chills, or sweats, no generalized fatigue, change in appetite Eyes: No visual changes, double vision, eye pain Ear, nose and throat: No hearing loss, ear pain, nasal congestion, sore throat Cardiovascular: No chest pain, palpitations Respiratory:  No shortness of breath at rest or with exertion, wheezes GastrointestinaI: No nausea, vomiting, diarrhea, abdominal pain,  fecal incontinence Genitourinary:  No dysuria, urinary retention or frequency Musculoskeletal:  No neck pain, back pain Integumentary: No rash, pruritus, skin lesions Neurological: as above Psychiatric: No depression, insomnia, anxiety Endocrine: No palpitations, fatigue, diaphoresis, mood swings, change in appetite, change in weight, increased thirst Hematologic/Lymphatic:  No purpura, petechiae. Allergic/Immunologic: no itchy/runny eyes, nasal congestion, recent allergic reactions, rashes  PHYSICAL EXAM: Blood pressure (!) 143/90, pulse 74, height 5\' 2"  (1.575 m), weight 184 lb (83.5 kg), SpO2 98 %. General: No acute distress.  Patient appears well-groomed.  Head:  Normocephalic/atraumatic Eyes:  fundi examined but not visualized Neck: supple, no paraspinal tenderness, full range of motion Back: No paraspinal tenderness Heart: regular rate and rhythm Lungs: Clear to auscultation bilaterally. Vascular: No carotid bruits. Neurological Exam: Mental status: alert and oriented to person, place, and time, recent and remote memory intact, fund of knowledge intact, attention and concentration intact, speech fluent and not dysarthric, language intact. Cranial nerves: CN I: not  tested CN II: pupils equal, round and reactive to light, visual fields intact CN III, IV, VI:  full range of motion, no nystagmus, no ptosis CN V: facial sensation intact CN VII: upper and lower face symmetric CN VIII: hearing intact CN IX, X: gag intact, uvula midline CN XI: sternocleidomastoid and trapezius muscles intact CN XII: tongue midline Bulk & Tone: normal, no fasciculations. Motor:  5/5 throughout  Sensation: temperature and vibration sensation intact. Deep Tendon Reflexes:  2+ throughout, toes downgoing.  Finger to nose testing:  Without dysmetria.  Heel to shin:  Without dysmetria.  Gait:  Normal station and stride.  Able to turn and tandem walk. Romberg negative.  IMPRESSION: Dizziness/vertigo.   I suspect anxiety is playing a significant role in her dizziness but there may be a migraine component as well (migrainous vertigo), given associated headache.  PLAN: 1.  Will restart sertraline, as she was feeling better on it.  25mg  daily for a week, then 50mg  daily.  If symptoms not improved in 2 months, would titrate further to 100mg  daily. 2.  Follow up in 4 months.  Thank you for allowing me to take part in the care of this patient.  , DO  CC: , MD

## 2020-01-03 ENCOUNTER — Other Ambulatory Visit: Payer: Self-pay

## 2020-01-03 ENCOUNTER — Encounter: Payer: Self-pay | Admitting: Neurology

## 2020-01-03 ENCOUNTER — Ambulatory Visit (INDEPENDENT_AMBULATORY_CARE_PROVIDER_SITE_OTHER): Payer: 59 | Admitting: Neurology

## 2020-01-03 VITALS — BP 143/90 | HR 74 | Ht 62.0 in | Wt 184.0 lb

## 2020-01-03 DIAGNOSIS — G43109 Migraine with aura, not intractable, without status migrainosus: Secondary | ICD-10-CM

## 2020-01-03 DIAGNOSIS — F419 Anxiety disorder, unspecified: Secondary | ICD-10-CM | POA: Diagnosis not present

## 2020-01-03 MED ORDER — SERTRALINE HCL 50 MG PO TABS: 50.0000 mg | ORAL_TABLET | Freq: Every day | ORAL | 3 refills | Status: DC

## 2020-01-03 NOTE — Patient Instructions (Signed)
1.  Start sertraline 50mg  tablet.  Take 1/2 tablet daily for a week, then increase to 1 tablet daily.  If spells are not improved in 2 months, contact me and we can increase dose. 2.  Follow up in 4 months.

## 2020-01-10 DIAGNOSIS — R0782 Intercostal pain: Secondary | ICD-10-CM | POA: Diagnosis not present

## 2020-01-10 DIAGNOSIS — M9901 Segmental and somatic dysfunction of cervical region: Secondary | ICD-10-CM | POA: Diagnosis not present

## 2020-01-10 DIAGNOSIS — M9905 Segmental and somatic dysfunction of pelvic region: Secondary | ICD-10-CM | POA: Diagnosis not present

## 2020-01-10 DIAGNOSIS — M9903 Segmental and somatic dysfunction of lumbar region: Secondary | ICD-10-CM | POA: Diagnosis not present

## 2020-01-10 DIAGNOSIS — M5442 Lumbago with sciatica, left side: Secondary | ICD-10-CM | POA: Diagnosis not present

## 2020-01-10 DIAGNOSIS — M955 Acquired deformity of pelvis: Secondary | ICD-10-CM | POA: Diagnosis not present

## 2020-01-10 DIAGNOSIS — M531 Cervicobrachial syndrome: Secondary | ICD-10-CM | POA: Diagnosis not present

## 2020-01-10 DIAGNOSIS — M546 Pain in thoracic spine: Secondary | ICD-10-CM | POA: Diagnosis not present

## 2020-01-10 DIAGNOSIS — M9902 Segmental and somatic dysfunction of thoracic region: Secondary | ICD-10-CM | POA: Diagnosis not present

## 2020-01-14 ENCOUNTER — Telehealth: Payer: Self-pay | Admitting: Neurology

## 2020-01-14 NOTE — Telephone Encounter (Signed)
Patient left msg with after hours about experiencing a popping sensation on the back of her head and she got a really warm feeling in the back of her neck that went down to her shoulders. Thanks!

## 2020-01-14 NOTE — Telephone Encounter (Signed)
Noted  

## 2020-01-14 NOTE — Telephone Encounter (Signed)
Just wanted you to know  

## 2020-01-17 DIAGNOSIS — F419 Anxiety disorder, unspecified: Secondary | ICD-10-CM | POA: Diagnosis not present

## 2020-01-17 DIAGNOSIS — Z0000 Encounter for general adult medical examination without abnormal findings: Secondary | ICD-10-CM | POA: Diagnosis not present

## 2020-01-18 DIAGNOSIS — R0782 Intercostal pain: Secondary | ICD-10-CM | POA: Diagnosis not present

## 2020-01-18 DIAGNOSIS — M5442 Lumbago with sciatica, left side: Secondary | ICD-10-CM | POA: Diagnosis not present

## 2020-01-18 DIAGNOSIS — M546 Pain in thoracic spine: Secondary | ICD-10-CM | POA: Diagnosis not present

## 2020-01-18 DIAGNOSIS — M9905 Segmental and somatic dysfunction of pelvic region: Secondary | ICD-10-CM | POA: Diagnosis not present

## 2020-01-18 DIAGNOSIS — M9902 Segmental and somatic dysfunction of thoracic region: Secondary | ICD-10-CM | POA: Diagnosis not present

## 2020-01-18 DIAGNOSIS — M531 Cervicobrachial syndrome: Secondary | ICD-10-CM | POA: Diagnosis not present

## 2020-01-18 DIAGNOSIS — M9903 Segmental and somatic dysfunction of lumbar region: Secondary | ICD-10-CM | POA: Diagnosis not present

## 2020-01-18 DIAGNOSIS — M955 Acquired deformity of pelvis: Secondary | ICD-10-CM | POA: Diagnosis not present

## 2020-01-18 DIAGNOSIS — M9901 Segmental and somatic dysfunction of cervical region: Secondary | ICD-10-CM | POA: Diagnosis not present

## 2020-01-20 DIAGNOSIS — M9903 Segmental and somatic dysfunction of lumbar region: Secondary | ICD-10-CM | POA: Diagnosis not present

## 2020-01-20 DIAGNOSIS — M5442 Lumbago with sciatica, left side: Secondary | ICD-10-CM | POA: Diagnosis not present

## 2020-01-20 DIAGNOSIS — M531 Cervicobrachial syndrome: Secondary | ICD-10-CM | POA: Diagnosis not present

## 2020-01-20 DIAGNOSIS — M9905 Segmental and somatic dysfunction of pelvic region: Secondary | ICD-10-CM | POA: Diagnosis not present

## 2020-01-20 DIAGNOSIS — M9902 Segmental and somatic dysfunction of thoracic region: Secondary | ICD-10-CM | POA: Diagnosis not present

## 2020-01-20 DIAGNOSIS — R0782 Intercostal pain: Secondary | ICD-10-CM | POA: Diagnosis not present

## 2020-01-20 DIAGNOSIS — M9901 Segmental and somatic dysfunction of cervical region: Secondary | ICD-10-CM | POA: Diagnosis not present

## 2020-01-20 DIAGNOSIS — M955 Acquired deformity of pelvis: Secondary | ICD-10-CM | POA: Diagnosis not present

## 2020-01-20 DIAGNOSIS — M546 Pain in thoracic spine: Secondary | ICD-10-CM | POA: Diagnosis not present

## 2020-01-24 DIAGNOSIS — H52223 Regular astigmatism, bilateral: Secondary | ICD-10-CM | POA: Diagnosis not present

## 2020-01-24 DIAGNOSIS — M9902 Segmental and somatic dysfunction of thoracic region: Secondary | ICD-10-CM | POA: Diagnosis not present

## 2020-01-24 DIAGNOSIS — M955 Acquired deformity of pelvis: Secondary | ICD-10-CM | POA: Diagnosis not present

## 2020-01-24 DIAGNOSIS — H5203 Hypermetropia, bilateral: Secondary | ICD-10-CM | POA: Diagnosis not present

## 2020-01-24 DIAGNOSIS — M9905 Segmental and somatic dysfunction of pelvic region: Secondary | ICD-10-CM | POA: Diagnosis not present

## 2020-01-24 DIAGNOSIS — M9901 Segmental and somatic dysfunction of cervical region: Secondary | ICD-10-CM | POA: Diagnosis not present

## 2020-01-24 DIAGNOSIS — M531 Cervicobrachial syndrome: Secondary | ICD-10-CM | POA: Diagnosis not present

## 2020-01-24 DIAGNOSIS — R0782 Intercostal pain: Secondary | ICD-10-CM | POA: Diagnosis not present

## 2020-01-24 DIAGNOSIS — M546 Pain in thoracic spine: Secondary | ICD-10-CM | POA: Diagnosis not present

## 2020-01-24 DIAGNOSIS — M5442 Lumbago with sciatica, left side: Secondary | ICD-10-CM | POA: Diagnosis not present

## 2020-01-24 DIAGNOSIS — M9903 Segmental and somatic dysfunction of lumbar region: Secondary | ICD-10-CM | POA: Diagnosis not present

## 2020-01-31 DIAGNOSIS — M955 Acquired deformity of pelvis: Secondary | ICD-10-CM | POA: Diagnosis not present

## 2020-01-31 DIAGNOSIS — M531 Cervicobrachial syndrome: Secondary | ICD-10-CM | POA: Diagnosis not present

## 2020-01-31 DIAGNOSIS — F419 Anxiety disorder, unspecified: Secondary | ICD-10-CM | POA: Diagnosis not present

## 2020-01-31 DIAGNOSIS — M9902 Segmental and somatic dysfunction of thoracic region: Secondary | ICD-10-CM | POA: Diagnosis not present

## 2020-01-31 DIAGNOSIS — M5442 Lumbago with sciatica, left side: Secondary | ICD-10-CM | POA: Diagnosis not present

## 2020-01-31 DIAGNOSIS — M9905 Segmental and somatic dysfunction of pelvic region: Secondary | ICD-10-CM | POA: Diagnosis not present

## 2020-01-31 DIAGNOSIS — M546 Pain in thoracic spine: Secondary | ICD-10-CM | POA: Diagnosis not present

## 2020-01-31 DIAGNOSIS — M9901 Segmental and somatic dysfunction of cervical region: Secondary | ICD-10-CM | POA: Diagnosis not present

## 2020-01-31 DIAGNOSIS — M9903 Segmental and somatic dysfunction of lumbar region: Secondary | ICD-10-CM | POA: Diagnosis not present

## 2020-01-31 DIAGNOSIS — R0782 Intercostal pain: Secondary | ICD-10-CM | POA: Diagnosis not present

## 2020-02-02 DIAGNOSIS — R42 Dizziness and giddiness: Secondary | ICD-10-CM | POA: Diagnosis not present

## 2020-02-02 DIAGNOSIS — F419 Anxiety disorder, unspecified: Secondary | ICD-10-CM | POA: Diagnosis not present

## 2020-02-02 DIAGNOSIS — R110 Nausea: Secondary | ICD-10-CM | POA: Diagnosis not present

## 2020-02-02 DIAGNOSIS — E639 Nutritional deficiency, unspecified: Secondary | ICD-10-CM | POA: Diagnosis not present

## 2020-02-02 DIAGNOSIS — E559 Vitamin D deficiency, unspecified: Secondary | ICD-10-CM | POA: Diagnosis not present

## 2020-02-02 DIAGNOSIS — N926 Irregular menstruation, unspecified: Secondary | ICD-10-CM | POA: Diagnosis not present

## 2020-02-02 DIAGNOSIS — G4700 Insomnia, unspecified: Secondary | ICD-10-CM | POA: Diagnosis not present

## 2020-02-08 DIAGNOSIS — M546 Pain in thoracic spine: Secondary | ICD-10-CM | POA: Diagnosis not present

## 2020-02-08 DIAGNOSIS — M9901 Segmental and somatic dysfunction of cervical region: Secondary | ICD-10-CM | POA: Diagnosis not present

## 2020-02-08 DIAGNOSIS — M9905 Segmental and somatic dysfunction of pelvic region: Secondary | ICD-10-CM | POA: Diagnosis not present

## 2020-02-08 DIAGNOSIS — R0782 Intercostal pain: Secondary | ICD-10-CM | POA: Diagnosis not present

## 2020-02-08 DIAGNOSIS — M9902 Segmental and somatic dysfunction of thoracic region: Secondary | ICD-10-CM | POA: Diagnosis not present

## 2020-02-08 DIAGNOSIS — M9903 Segmental and somatic dysfunction of lumbar region: Secondary | ICD-10-CM | POA: Diagnosis not present

## 2020-02-08 DIAGNOSIS — M531 Cervicobrachial syndrome: Secondary | ICD-10-CM | POA: Diagnosis not present

## 2020-02-08 DIAGNOSIS — M955 Acquired deformity of pelvis: Secondary | ICD-10-CM | POA: Diagnosis not present

## 2020-02-08 DIAGNOSIS — M5442 Lumbago with sciatica, left side: Secondary | ICD-10-CM | POA: Diagnosis not present

## 2020-02-14 DIAGNOSIS — M5442 Lumbago with sciatica, left side: Secondary | ICD-10-CM | POA: Diagnosis not present

## 2020-02-14 DIAGNOSIS — M9902 Segmental and somatic dysfunction of thoracic region: Secondary | ICD-10-CM | POA: Diagnosis not present

## 2020-02-14 DIAGNOSIS — M9905 Segmental and somatic dysfunction of pelvic region: Secondary | ICD-10-CM | POA: Diagnosis not present

## 2020-02-14 DIAGNOSIS — M955 Acquired deformity of pelvis: Secondary | ICD-10-CM | POA: Diagnosis not present

## 2020-02-14 DIAGNOSIS — M9903 Segmental and somatic dysfunction of lumbar region: Secondary | ICD-10-CM | POA: Diagnosis not present

## 2020-02-14 DIAGNOSIS — R0782 Intercostal pain: Secondary | ICD-10-CM | POA: Diagnosis not present

## 2020-02-14 DIAGNOSIS — M546 Pain in thoracic spine: Secondary | ICD-10-CM | POA: Diagnosis not present

## 2020-02-14 DIAGNOSIS — M531 Cervicobrachial syndrome: Secondary | ICD-10-CM | POA: Diagnosis not present

## 2020-02-14 DIAGNOSIS — M9901 Segmental and somatic dysfunction of cervical region: Secondary | ICD-10-CM | POA: Diagnosis not present

## 2020-02-15 DIAGNOSIS — J029 Acute pharyngitis, unspecified: Secondary | ICD-10-CM | POA: Diagnosis not present

## 2020-02-22 DIAGNOSIS — R0782 Intercostal pain: Secondary | ICD-10-CM | POA: Diagnosis not present

## 2020-02-22 DIAGNOSIS — M9905 Segmental and somatic dysfunction of pelvic region: Secondary | ICD-10-CM | POA: Diagnosis not present

## 2020-02-22 DIAGNOSIS — M9902 Segmental and somatic dysfunction of thoracic region: Secondary | ICD-10-CM | POA: Diagnosis not present

## 2020-02-22 DIAGNOSIS — M955 Acquired deformity of pelvis: Secondary | ICD-10-CM | POA: Diagnosis not present

## 2020-02-22 DIAGNOSIS — M546 Pain in thoracic spine: Secondary | ICD-10-CM | POA: Diagnosis not present

## 2020-02-22 DIAGNOSIS — M9903 Segmental and somatic dysfunction of lumbar region: Secondary | ICD-10-CM | POA: Diagnosis not present

## 2020-02-22 DIAGNOSIS — M5442 Lumbago with sciatica, left side: Secondary | ICD-10-CM | POA: Diagnosis not present

## 2020-02-22 DIAGNOSIS — M9901 Segmental and somatic dysfunction of cervical region: Secondary | ICD-10-CM | POA: Diagnosis not present

## 2020-02-22 DIAGNOSIS — M531 Cervicobrachial syndrome: Secondary | ICD-10-CM | POA: Diagnosis not present

## 2020-03-03 ENCOUNTER — Ambulatory Visit (INDEPENDENT_AMBULATORY_CARE_PROVIDER_SITE_OTHER): Payer: 59 | Admitting: Physician Assistant

## 2020-03-03 ENCOUNTER — Other Ambulatory Visit: Payer: Self-pay

## 2020-03-03 ENCOUNTER — Encounter: Payer: Self-pay | Admitting: Physician Assistant

## 2020-03-03 VITALS — BP 137/83 | HR 91 | Ht 62.0 in | Wt 180.0 lb

## 2020-03-03 DIAGNOSIS — R310 Gross hematuria: Secondary | ICD-10-CM

## 2020-03-03 LAB — MICROSCOPIC EXAMINATION

## 2020-03-03 LAB — URINALYSIS, COMPLETE
Bilirubin, UA: NEGATIVE
Glucose, UA: NEGATIVE
Ketones, UA: NEGATIVE
Leukocytes,UA: NEGATIVE
Nitrite, UA: NEGATIVE
Specific Gravity, UA: >1.03 — ABNORMAL HIGH (ref 1.005–1.030)
Urobilinogen, Ur: 0.2 mg/dL (ref 0.2–1.0)
pH, UA: 5 (ref 5.0–7.5)

## 2020-03-03 NOTE — Progress Notes (Addendum)
03/03/2020 2:12 PM   Barbara Cole [birth year] [id number]  CC: Michaell Cowing hematuria  HPI: Barbara Cole is a 29 y.o. female who presents today for evaluation of gross hematuria following exercise.  Urologic history significant for multiple left renal cysts including a Bosniak 36F lesion of the left lower pole, punctate right renal calculi, and intermittent gross hematuria.  She underwent CTAP with contrast on 06/22/2019 with follow-up MRI abdomen with and without contrast on 09/23/2019 for further evaluation of her symptoms.  She declined cystoscopy due to her anxiety.  She has been recommended to undergo repeat MRI abdomen with and without contrast around September 2021.  Today, she reports gross hematuria that started 10 days ago after outdoor jogging and walking.  She has been making an effort to increase her physical activity lately and is exercising approximately 5 days a week.  She states her gross hematuria resolved within 2 days and has not come back since.  She denies fever, chills, vomiting, dysuria, frequency, and urgency.  She does report some nausea and back pain at baseline.  She denies a family history of polycystic kidney disease.  She reports a distant maternal relative with kidney cancer.  In-office UA today positive for 2+ blood and 1+ protein; urine microscopy with 11-30 RBCs/HPF and many bacteria.   PMH: Past Medical History:  Diagnosis Date  . Anal fissure   . Anxiety   . Depression   . History of kidney stones 07/2019   right kidney stone currently  . Stomach ulcer     Surgical History: Past Surgical History:  Procedure Laterality Date  . CHOLECYSTECTOMY N/A 08/25/2019   Procedure: LAPAROSCOPIC CHOLECYSTECTOMY;  Surgeon: Carolan Shiver, MD;  Location: [facility] ORS;  Service: General;  Laterality: N/A;  . ESOPHAGOGASTRODUODENOSCOPY (EGD) WITH PROPOFOL N/A 07/12/2019   Procedure: ESOPHAGOGASTRODUODENOSCOPY (EGD) WITH PROPOFOL;  Surgeon: Christena Deem, MD;  Location: [facility] ENDOSCOPY;  Service: Endoscopy;  Laterality: N/A;  . WISDOM TOOTH EXTRACTION      Home Medications:  Allergies as of 03/03/2020      Reactions   Hydrocodone Anaphylaxis   Antacid Medicine [traumeel] Other (See Comments)   High doses of antacids were causing dizziness.  Not sure if she still has a stomach ulcer so her MD d/c'd these meds      Medication List       Accurate as of March 03, 2020  2:12 PM. If you have any questions, ask your nurse or doctor.        STOP taking these medications   B-12-SL 1000 MCG Subl Generic drug: Cyanocobalamin Stopped by: Carman Ching, PA-C   dicyclomine 10 MG capsule Commonly known as: BENTYL Stopped by: Carman Ching, PA-C   traZODone 50 MG tablet Commonly known as: DESYREL Stopped by: Carman Ching, PA-C     TAKE these medications   ALPRAZolam 0.25 MG tablet Commonly known as: XANAX Take 1 tablet by mouth 3 (three) times daily as needed.   b complex vitamins capsule Take 1 capsule by mouth daily.   Biotin 1000 MCG tablet Take 1,000 mcg by mouth 3 (three) times daily.   cholecalciferol 25 MCG (1000 UNIT) tablet Commonly known as: VITAMIN D3 Take 1,000 Units by mouth daily.   Migraine Relief 250-250-65 MG tablet Generic drug: aspirin-acetaminophen-caffeine Take by mouth every 6 (six) hours as needed for headache.   ondansetron 4 MG disintegrating tablet Commonly known as: Zofran ODT Take 1 tablet (4 mg total) by mouth every 8 (eight) hours as  needed for nausea.   Probiotic 250 MG Caps Take by mouth.   sertraline 50 MG tablet Commonly known as: Zoloft Take 1 tablet (50 mg total) by mouth daily.   vitamin C 100 MG tablet Take 100 mg by mouth daily.   vitamin k 100 MCG tablet Take 100 mcg by mouth daily.   zinc gluconate 50 MG tablet Take 50 mg by mouth daily.       Allergies:  Allergies  Allergen Reactions  . Hydrocodone Anaphylaxis  . Antacid Medicine [Traumeel]  Other (See Comments)    High doses of antacids were causing dizziness.  Not sure if she still has a stomach ulcer so her MD d/c'd these meds    Family History: Family History  Problem Relation Age of Onset  . Pancreatic cancer Maternal Grandfather   . Crohn's disease Maternal Grandfather   . Colon cancer Neg Hx     Social History:   reports that she has never smoked. She has never used smokeless tobacco. She reports current alcohol use. She reports that she does not use drugs.  Physical Exam: BP 137/83   Pulse 91   Ht 5\' 2"  (1.575 m)   Wt 180 lb (81.6 kg)   BMI 32.92 kg/m   Constitutional:  Alert and oriented, no acute distress, nontoxic appearing HEENT: [place], AT Cardiovascular: No clubbing, cyanosis, or edema Respiratory: Normal respiratory effort, no increased work of breathing Skin: No rashes, bruises or suspicious lesions Neurologic: Grossly intact, no focal deficits, moving all 4 extremities Psychiatric: Normal mood and affect  Laboratory Data: Results for orders placed or performed in visit on 03/03/20  Microscopic Examination   URINE  Result Value Ref Range   WBC, UA 0-5 0 - 5 /hpf   RBC 11-30 (A) 0 - 2 /hpf   Epithelial Cells (non renal) 0-10 0 - 10 /hpf   Bacteria, UA Many (A) None seen/Few  Urinalysis, Complete  Result Value Ref Range   Specific Gravity, UA >1.030 (H) 1.005 - 1.030   pH, UA 5.0 5.0 - 7.5   Color, UA Yellow Yellow   Appearance Ur Cloudy (A) Clear   Leukocytes,UA Negative Negative   Protein,UA 1+ (A) Negative/Trace   Glucose, UA Negative Negative   Ketones, UA Negative Negative   RBC, UA 2+ (A) Negative   Bilirubin, UA Negative Negative   Urobilinogen, Ur 0.2 0.2 - 1.0 mg/dL   Nitrite, UA Negative Negative   Microscopic Examination See below:    Assessment & Plan:   1. Gross hematuria 29 year old female with a history of right renal stones and the left renal cysts including a Bosniak 80f renal cyst scheduled for repeat imaging later this  year presents with a 10-day history of exercise-induced gross hematuria.  Gross hematuria has resolved, however she does have microscopic hematuria on UA today.  I counseled her that exercise induced hematuria is often a benign process, however given her history I recommend that she undergo previously recommended cystoscopy at this time to complete her recent hematuria work-up and rule out bladder pathology as contributory.  She is willing to proceed with cystoscopy at this time.  I counseled her to continue exercising in the interim.  She denies irritative voiding symptoms today, however given our plans for cystoscopy next month I will obtain urine culture today and treat per results to sterilize the urine in advance of imaging. - Urinalysis, Complete - CULTURE, URINE COMPREHENSIVE   Return in about 4 weeks (around 03/31/2020) for Cystoscopy with  Dr. Apolinar Junes.  Carman Ching, PA-C  [facility] [address], [address] [place], [place] [address] [phone]

## 2020-03-06 LAB — CULTURE, URINE COMPREHENSIVE

## 2020-03-07 DIAGNOSIS — M9903 Segmental and somatic dysfunction of lumbar region: Secondary | ICD-10-CM | POA: Diagnosis not present

## 2020-03-07 DIAGNOSIS — M546 Pain in thoracic spine: Secondary | ICD-10-CM | POA: Diagnosis not present

## 2020-03-07 DIAGNOSIS — M5442 Lumbago with sciatica, left side: Secondary | ICD-10-CM | POA: Diagnosis not present

## 2020-03-07 DIAGNOSIS — R0782 Intercostal pain: Secondary | ICD-10-CM | POA: Diagnosis not present

## 2020-03-07 DIAGNOSIS — M531 Cervicobrachial syndrome: Secondary | ICD-10-CM | POA: Diagnosis not present

## 2020-03-07 DIAGNOSIS — M9905 Segmental and somatic dysfunction of pelvic region: Secondary | ICD-10-CM | POA: Diagnosis not present

## 2020-03-07 DIAGNOSIS — M9902 Segmental and somatic dysfunction of thoracic region: Secondary | ICD-10-CM | POA: Diagnosis not present

## 2020-03-07 DIAGNOSIS — M9901 Segmental and somatic dysfunction of cervical region: Secondary | ICD-10-CM | POA: Diagnosis not present

## 2020-03-07 DIAGNOSIS — M955 Acquired deformity of pelvis: Secondary | ICD-10-CM | POA: Diagnosis not present

## 2020-03-07 IMAGING — MR MR ABDOMEN WO/W CM
17 series · 48 of 48 positions shown · IV contrast (7ml Gadavist)
Comparison: 06/22/2019

CLINICAL DATA: Left renal cystic lesion on CT for further
characterization.

EXAM:
MRI ABDOMEN WITHOUT AND WITH CONTRAST
TECHNIQUE: Multiplanar multisequence MR imaging of the abdomen was performed
both before and after the administration of intravenous contrast.
CONTRAST:  7mL GADAVIST GADOBUTROL 1 MMOL/ML IV SOLN

[Series 3: cor haste · coronal · 6.0mm · 1.19mm/px · 2 of 32 slices shown]
[im 1/32]
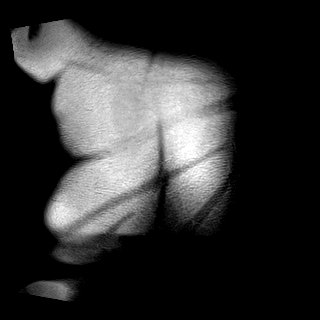
[im 32/32]
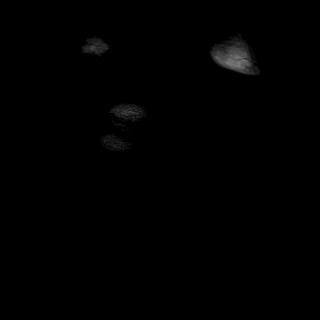

[Series 5: T2 fat-sat · axial · 6.0mm · 1.19mm/px · z∈[-106,+132]mm · 2 of 34 slices shown]
[im 1/34]
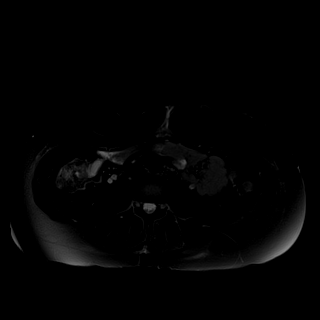
[im 34/34]
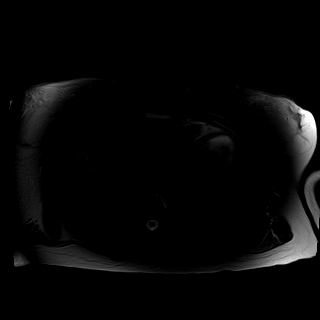

[Series 7: DWI · axial · 6.0mm · 1.42mm/px · z∈[-106,+132]mm · 5 of 102 slices shown (1 of 2)]
[im 1/102]
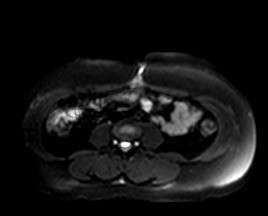
[im 26/102]
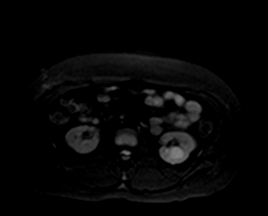
[im 51/102]
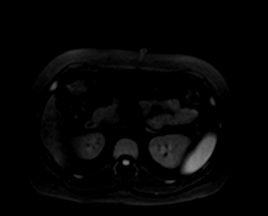
[im 76/102]
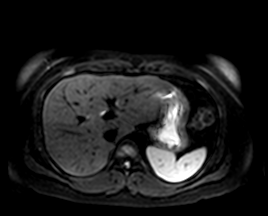
[im 102/102]
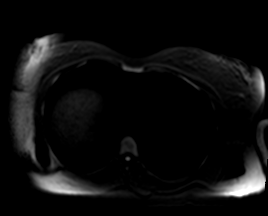

[Series 8: DWI · axial · 6.0mm · 1.42mm/px · 1 of 34 slices shown (2 of 2)]
[im 1/34]
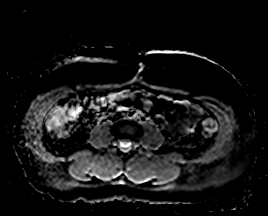

[Series 9: ax in & · axial · 3.5mm · 1.19mm/px · z∈[-97,+151]mm · 6 of 144 slices shown]
[im 1/144]
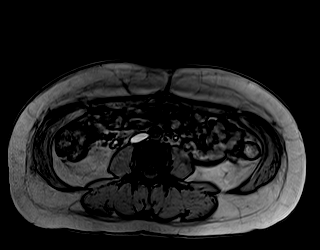
[im 29/144]
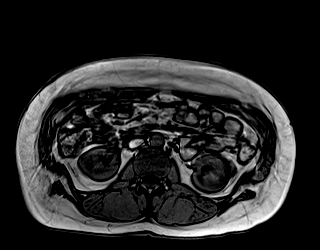
[im 58/144]
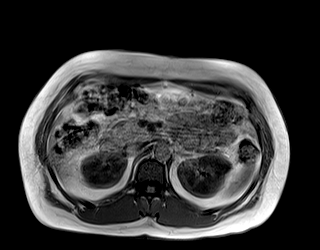
[im 86/144]
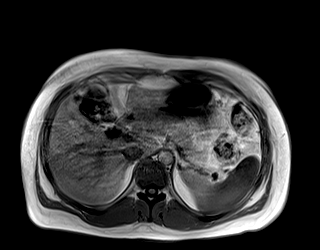
[im 115/144]
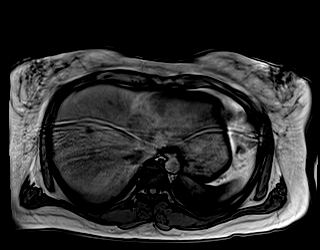
[im 144/144]
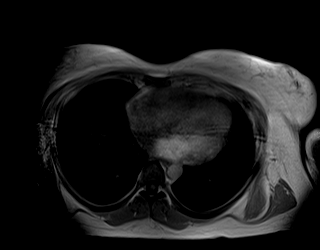

[Series 10: bSSFP · axial · 6.0mm · 0.74mm/px · 1 of 34 slices shown]
[im 1/34]
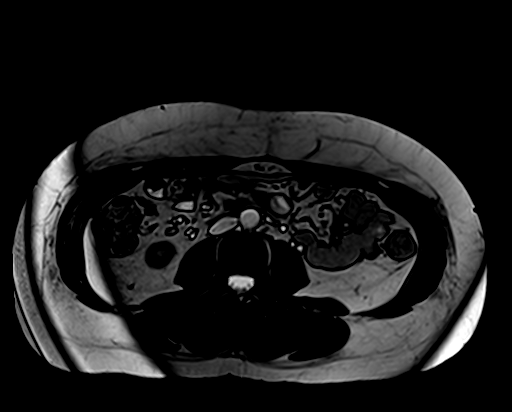

[Series 11: T1 dynamic · axial · non-contrast · 3.5mm · 1.19mm/px · z∈[-97,+151]mm · 3 of 72 slices shown (1 of 9)]
[im 1/72]
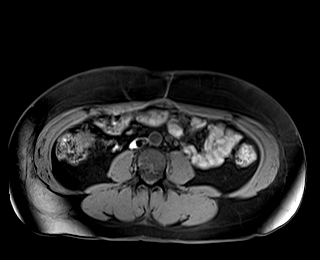
[im 36/72]
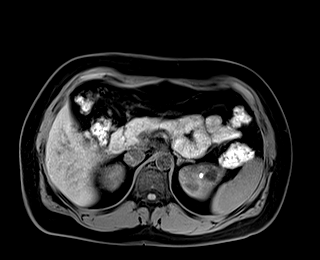
[im 72/72]
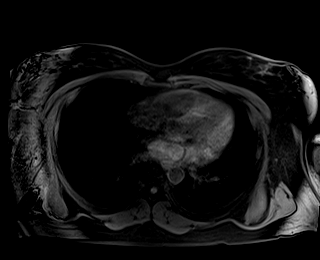

[Series 12: T1 dynamic · axial · 3.5mm · 1.19mm/px · z∈[-97,+151]mm · 3 of 72 slices shown (2 of 9)]
[im 1/72]
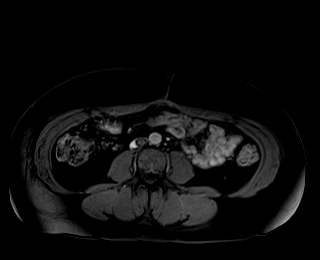
[im 36/72]
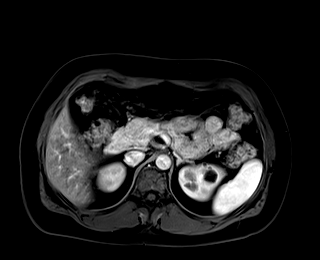
[im 72/72]
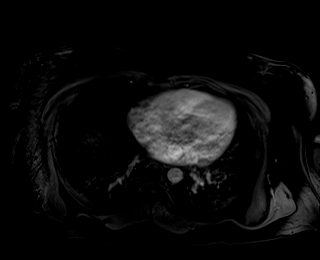

[Series 13: T1 dynamic · axial · 3.5mm · 1.19mm/px · z∈[-97,+151]mm · 3 of 72 slices shown (3 of 9)]
[im 1/72]
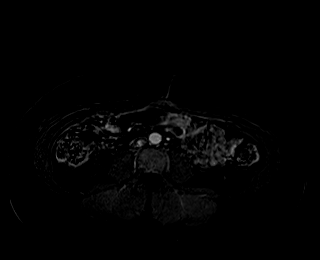
[im 36/72]
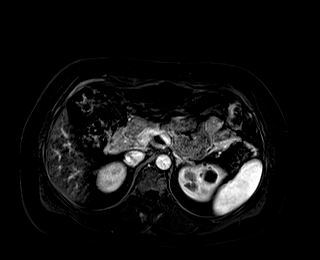
[im 72/72]
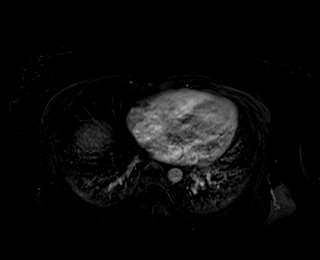

[Series 14: T1 dynamic · axial · 3.5mm · 1.19mm/px · z∈[-97,+151]mm · 3 of 72 slices shown (4 of 9)]
[im 1/72]
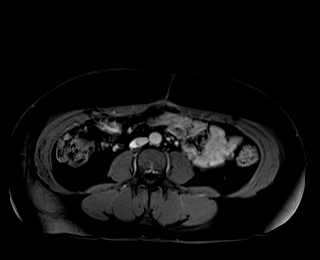
[im 36/72]
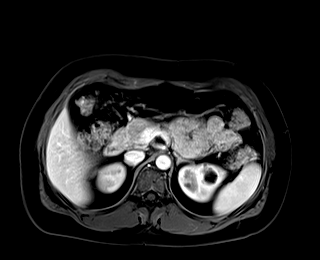
[im 72/72]
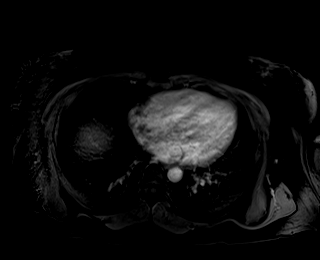

[Series 15: T1 dynamic · axial · 3.5mm · 1.19mm/px · z∈[-97,+151]mm · 3 of 72 slices shown (5 of 9)]
[im 1/72]
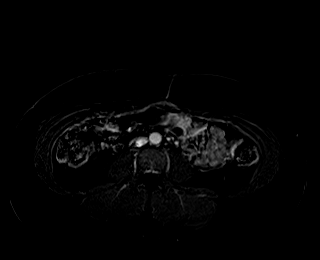
[im 36/72]
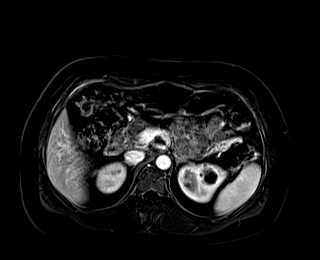
[im 72/72]
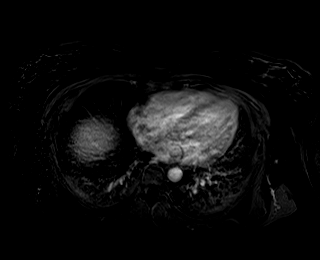

[Series 16: T1 dynamic · axial · 3.5mm · 1.19mm/px · z∈[-97,+151]mm · 3 of 72 slices shown (6 of 9)]
[im 1/72]
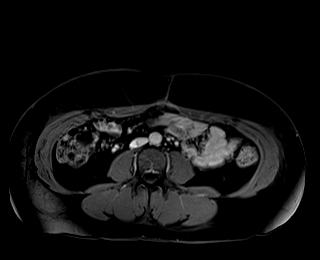
[im 36/72]
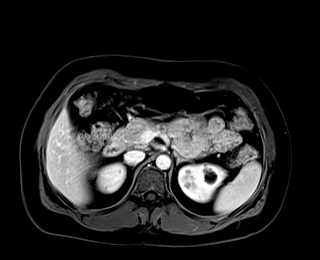
[im 72/72]
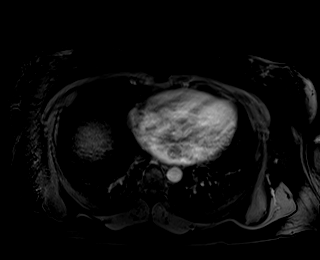

[Series 17: T1 dynamic · axial · 3.5mm · 1.19mm/px · z∈[-97,+151]mm · 3 of 72 slices shown (7 of 9)]
[im 1/72]
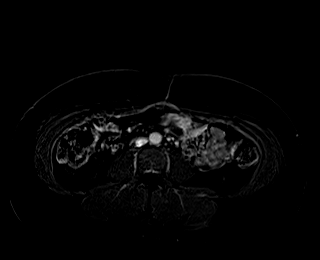
[im 36/72]
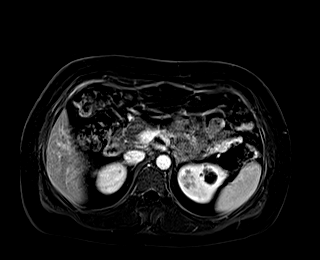
[im 72/72]
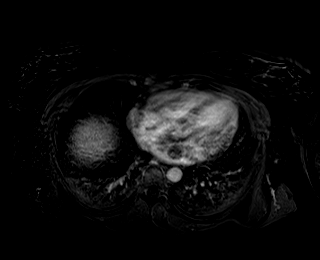

[Series 18: T1 dynamic post-contrast · coronal · 3.0mm · 1.31mm/px · 3 of 80 slices shown]
[im 1/80]
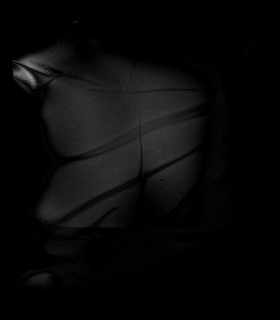
[im 40/80]
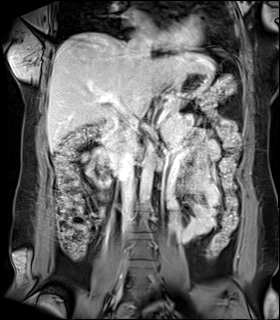
[im 80/80]
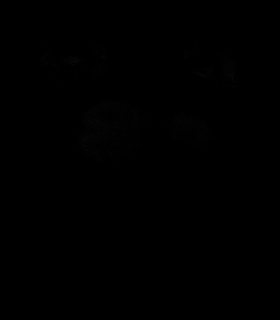

[Series 19: T1 dynamic · axial · 3.5mm · 1.19mm/px · z∈[-97,+151]mm · 3 of 72 slices shown (8 of 9)]
[im 1/72]
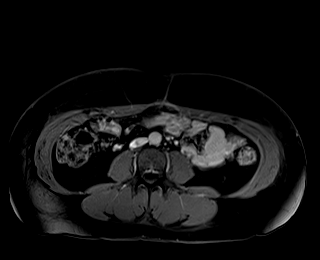
[im 36/72]
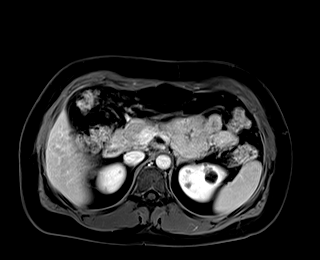
[im 72/72]
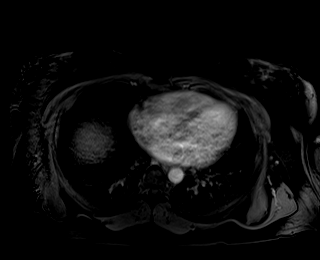

[Series 20: T1 dynamic · axial · 3.5mm · 1.19mm/px · z∈[-97,+151]mm · 3 of 72 slices shown (9 of 9)]
[im 1/72]
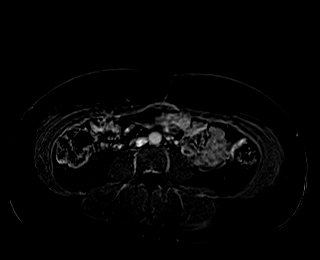
[im 36/72]
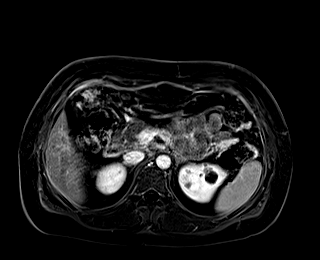
[im 72/72]
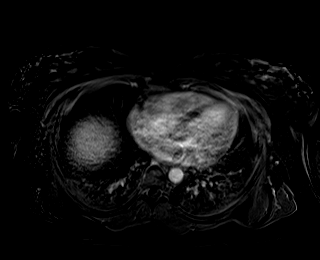

[Series 21: T2 · axial · 6.0mm · 1.19mm/px · 1 of 34 slices shown]
[im 1/34]
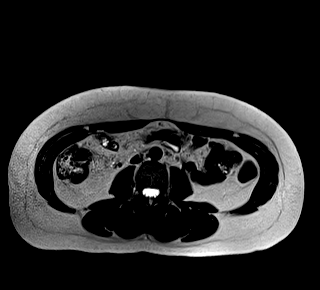

[48 of 48 positions shown; findings below may reference images not displayed]

FINDINGS: Lower chest: Unremarkable

Hepatobiliary: No significant degree of hepatic steatosis.
Gallbladder surgically absent. No focal liver lesion noted.

Pancreas:  Unremarkable

Spleen:  Unremarkable

Adrenals/Urinary Tract: Both adrenal glands appear normal. No
hydronephrosis.

Multiple (at least 15) left hepatic cysts, several of which have
complex elements. At least 3 lesions have accentuated precontrast T1
signal characteristics, most notably a 2.1 by 1.8 cm lesion in the
left kidney lower pole on image 57/11. On subtraction images I do
not perceive a significant degree of enhancement aside from minimal
septal enhancement within along some of the complex lesions. The
left kidney lower pole lesion on image 30/21 measures 2.2 by 1.9 cm
and appears to contain several intersecting septae including a
slightly thickened septum compatible with Bosniak category IIF cyst.

Previous small right renal calculi are better shown on prior CT.

Stomach/Bowel: Unremarkable

Vascular/Lymphatic:  Unremarkable

Other:  No supplemental non-categorized findings.

Musculoskeletal: Unremarkable
IMPRESSION: 1. There are approximately 15 cystic lesions of the left kidney,
several of which are complex. Most of these represent Bosniak
category 1 and category 2 cysts. A 2.2 cm cystic lesion in the left
kidney lower pole has intersecting septa, one of which is slightly
thickened, compatible with a Bosniak category IIF lesion (likely
benign, but meriting surveillance). We currently have 3 months of
surveillance of this lesion based on prior imaging. I recommend
follow up renal protocol MRI with and without contrast in 1 years
time. This recommendation is consistent with ACR consensus
guidelines: Management of the Incidental Renal Mass on CT: A White
Paper of the ACR Incidental Findings Committee. [HOSPITAL]

## 2020-03-22 DIAGNOSIS — M9901 Segmental and somatic dysfunction of cervical region: Secondary | ICD-10-CM | POA: Diagnosis not present

## 2020-03-22 DIAGNOSIS — R0782 Intercostal pain: Secondary | ICD-10-CM | POA: Diagnosis not present

## 2020-03-22 DIAGNOSIS — M955 Acquired deformity of pelvis: Secondary | ICD-10-CM | POA: Diagnosis not present

## 2020-03-22 DIAGNOSIS — M546 Pain in thoracic spine: Secondary | ICD-10-CM | POA: Diagnosis not present

## 2020-03-22 DIAGNOSIS — M9902 Segmental and somatic dysfunction of thoracic region: Secondary | ICD-10-CM | POA: Diagnosis not present

## 2020-03-22 DIAGNOSIS — M5442 Lumbago with sciatica, left side: Secondary | ICD-10-CM | POA: Diagnosis not present

## 2020-03-22 DIAGNOSIS — M531 Cervicobrachial syndrome: Secondary | ICD-10-CM | POA: Diagnosis not present

## 2020-03-22 DIAGNOSIS — M9903 Segmental and somatic dysfunction of lumbar region: Secondary | ICD-10-CM | POA: Diagnosis not present

## 2020-03-22 DIAGNOSIS — M9905 Segmental and somatic dysfunction of pelvic region: Secondary | ICD-10-CM | POA: Diagnosis not present

## 2020-03-27 ENCOUNTER — Other Ambulatory Visit: Payer: Self-pay | Admitting: Neurology

## 2020-03-27 DIAGNOSIS — F419 Anxiety disorder, unspecified: Secondary | ICD-10-CM | POA: Diagnosis not present

## 2020-04-14 ENCOUNTER — Other Ambulatory Visit: Payer: Self-pay | Admitting: Urology

## 2020-04-21 DIAGNOSIS — M9903 Segmental and somatic dysfunction of lumbar region: Secondary | ICD-10-CM | POA: Diagnosis not present

## 2020-04-21 DIAGNOSIS — M9905 Segmental and somatic dysfunction of pelvic region: Secondary | ICD-10-CM | POA: Diagnosis not present

## 2020-04-21 DIAGNOSIS — M5442 Lumbago with sciatica, left side: Secondary | ICD-10-CM | POA: Diagnosis not present

## 2020-04-21 DIAGNOSIS — M955 Acquired deformity of pelvis: Secondary | ICD-10-CM | POA: Diagnosis not present

## 2020-04-21 DIAGNOSIS — M9901 Segmental and somatic dysfunction of cervical region: Secondary | ICD-10-CM | POA: Diagnosis not present

## 2020-04-21 DIAGNOSIS — M9902 Segmental and somatic dysfunction of thoracic region: Secondary | ICD-10-CM | POA: Diagnosis not present

## 2020-04-21 DIAGNOSIS — M531 Cervicobrachial syndrome: Secondary | ICD-10-CM | POA: Diagnosis not present

## 2020-04-21 DIAGNOSIS — R0782 Intercostal pain: Secondary | ICD-10-CM | POA: Diagnosis not present

## 2020-04-21 DIAGNOSIS — M546 Pain in thoracic spine: Secondary | ICD-10-CM | POA: Diagnosis not present

## 2020-05-01 ENCOUNTER — Ambulatory Visit: Payer: 59 | Admitting: Neurology

## 2020-05-08 DIAGNOSIS — J0100 Acute maxillary sinusitis, unspecified: Secondary | ICD-10-CM | POA: Diagnosis not present

## 2020-05-08 DIAGNOSIS — J029 Acute pharyngitis, unspecified: Secondary | ICD-10-CM | POA: Diagnosis not present

## 2020-05-12 ENCOUNTER — Other Ambulatory Visit: Payer: Self-pay | Admitting: Urology

## 2020-05-17 DIAGNOSIS — M5442 Lumbago with sciatica, left side: Secondary | ICD-10-CM | POA: Diagnosis not present

## 2020-05-17 DIAGNOSIS — M546 Pain in thoracic spine: Secondary | ICD-10-CM | POA: Diagnosis not present

## 2020-05-17 DIAGNOSIS — R0782 Intercostal pain: Secondary | ICD-10-CM | POA: Diagnosis not present

## 2020-05-17 DIAGNOSIS — M955 Acquired deformity of pelvis: Secondary | ICD-10-CM | POA: Diagnosis not present

## 2020-05-17 DIAGNOSIS — M531 Cervicobrachial syndrome: Secondary | ICD-10-CM | POA: Diagnosis not present

## 2020-05-17 DIAGNOSIS — M9903 Segmental and somatic dysfunction of lumbar region: Secondary | ICD-10-CM | POA: Diagnosis not present

## 2020-05-17 DIAGNOSIS — M9902 Segmental and somatic dysfunction of thoracic region: Secondary | ICD-10-CM | POA: Diagnosis not present

## 2020-05-17 DIAGNOSIS — M9901 Segmental and somatic dysfunction of cervical region: Secondary | ICD-10-CM | POA: Diagnosis not present

## 2020-05-17 DIAGNOSIS — M9905 Segmental and somatic dysfunction of pelvic region: Secondary | ICD-10-CM | POA: Diagnosis not present

## 2020-07-17 DIAGNOSIS — F419 Anxiety disorder, unspecified: Secondary | ICD-10-CM | POA: Diagnosis not present

## 2020-07-17 DIAGNOSIS — R635 Abnormal weight gain: Secondary | ICD-10-CM | POA: Diagnosis not present

## 2020-09-05 DIAGNOSIS — Z23 Encounter for immunization: Secondary | ICD-10-CM | POA: Diagnosis not present

## 2020-09-05 DIAGNOSIS — B001 Herpesviral vesicular dermatitis: Secondary | ICD-10-CM | POA: Diagnosis not present

## 2020-09-20 DIAGNOSIS — E610 Copper deficiency: Secondary | ICD-10-CM | POA: Diagnosis not present

## 2020-09-20 DIAGNOSIS — G4700 Insomnia, unspecified: Secondary | ICD-10-CM | POA: Diagnosis not present

## 2020-09-20 DIAGNOSIS — R5383 Other fatigue: Secondary | ICD-10-CM | POA: Diagnosis not present

## 2020-09-20 DIAGNOSIS — E612 Magnesium deficiency: Secondary | ICD-10-CM | POA: Diagnosis not present

## 2020-09-20 DIAGNOSIS — F419 Anxiety disorder, unspecified: Secondary | ICD-10-CM | POA: Diagnosis not present

## 2020-09-20 DIAGNOSIS — E60 Dietary zinc deficiency: Secondary | ICD-10-CM | POA: Diagnosis not present

## 2020-09-20 DIAGNOSIS — E559 Vitamin D deficiency, unspecified: Secondary | ICD-10-CM | POA: Diagnosis not present

## 2020-10-04 ENCOUNTER — Ambulatory Visit: Payer: 59 | Admitting: Urology

## 2020-10-16 ENCOUNTER — Other Ambulatory Visit: Payer: Self-pay

## 2020-10-16 ENCOUNTER — Ambulatory Visit
Admission: RE | Admit: 2020-10-16 | Discharge: 2020-10-16 | Disposition: A | Discharge status: Home / Self Care | Payer: 59 | Source: Ambulatory Visit | Attending: Urology | Admitting: Urology

## 2020-10-16 DIAGNOSIS — N281 Cyst of kidney, acquired: Secondary | ICD-10-CM | POA: Diagnosis not present

## 2020-10-16 DIAGNOSIS — Z9049 Acquired absence of other specified parts of digestive tract: Secondary | ICD-10-CM | POA: Diagnosis not present

## 2020-10-16 MED ORDER — GADOBUTROL 1 MMOL/ML IV SOLN
7.5000 mL | Freq: Once | INTRAVENOUS | Status: AC | PRN
  Administered 2020-10-16: 7.5 mL via INTRAVENOUS

## 2020-10-18 NOTE — Progress Notes (Signed)
10/19/2020 8:47 AM   Barbara Cole 03/04/1991 [id number]  Referring provider: Christeen Douglas, MD [address] [place],  [place] [address] Chief Complaint  Patient presents with  . Hematuria    HPI: Barbara Cole is a 29 y.o. female who returns for a 1 year follow up of acquired cyst of kidney, microscopic hematuria, gross hematuria and right kidney stone.   She underwent a CT abdomen pelvis with contrast on 06/22/2019 for further evaluation of epigastric pain and nausea.  Incidentally, she was noted to have several punctate right renal calculi as well as a mildly complex cyst in the left kidney with internal septations measuring 2 cm.  There is also a low-density lesion in the right kidney measuring 3 cm.  Both of these were incompletely characterized on this study.  No other GU pathology was identified.  She was ultimately diagnosed with gastritis found to have gastritis and ulcers on endoscopy.  She also reported an episode back in June just prior to the CT scan where she had episode of gross hematuria.  She reported that she had a long day at work standing on her feet.  She had a feeling pressure in her lower abdomen which also radiated to her left flank.  At the end of the day, she saw a fleck in the toilet and her pain resolved.  She was wondering if this is a stone episode.  She denied any previous stone episodes.  She also reported that last week, she was seen by her OB/GYN, Dr. Dalbert Garnet and was noted of blood in her urine.  She was menstruating at the time and had tampon in.  Her UA was negative.  She had no associated urinary symptoms including no urgency, frequency, dysuria.  No history of UTIs.  She reported that she was scheduled for an MRI of the abdomen, however, she was in the emergency room the previous night prior to when her MRI was scheduled.  She did up needing to reschedule but has not yet rescheduled the study.  She did report that she continued to  have flares with her gastritis.  She had been advised to drink anything though make her more alkaline including life water.  She was also been eating at least 2 times per day.  She was not able to tolerate PPIs or H2 blockers for the most part as they make her feel "drunk".  Patient saw Carman Ching, PA-C on 03/03/2020. She had gross hematuria x 2 days. She denied fever, chills, vomiting, dysuria, frequency, and urgency.  She did report some nausea and back pain at baseline. UA was positive for 2+ blood and 1+ protein; urine microscopy with 11-30 RBCs/HPF and many bacteria. Urine culture was negative.   Patient elected to not undergo recommended cystoscopy on 03/31/2020. Patient was mensurating at time of recommended cystoscopy.   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 Multiple additional Bosniak 1 renal cysts of the LEFT kidney. Probable new calculus within the RIGHT renal pelvis.  She has periodic flank and lower abdominal/pelvic pain. She has a cyst on her ovary which she feels causes discomfort. She has amber/tea color blood in urine after running for 30 to 45 minutes. She is very active and participates in boot camp exercises.   PMH: Past Medical History:  Diagnosis Date  . Anal  fissure   . Anxiety   . Depression   . History of kidney stones 07/2019   right kidney stone currently  . Stomach ulcer     Surgical History: Past Surgical History:  Procedure Laterality Date  . CHOLECYSTECTOMY N/A 08/25/2019   Procedure: LAPAROSCOPIC CHOLECYSTECTOMY;  Surgeon: Carolan Shiver, MD;  Location: [facility] ORS;  Service: General;  Laterality: N/A;  . ESOPHAGOGASTRODUODENOSCOPY (EGD) WITH PROPOFOL N/A 07/12/2019   Procedure: ESOPHAGOGASTRODUODENOSCOPY (EGD) WITH PROPOFOL;  Surgeon: Christena Deem, MD;  [facility]  ENDOSCOPY;  Service: Endoscopy;  Laterality: N/A;  . WISDOM TOOTH EXTRACTION      Home Medications:  Allergies as of 10/19/2020      Reactions   Hydrocodone Anaphylaxis   Antacid Medicine [traumeel] Other (See Comments)   High doses of antacids were causing dizziness.  Not sure if she still has a stomach ulcer so her MD d/c'd these meds      Medication List       Accurate as of October 19, 2020  8:47 AM. If you have any questions, ask your nurse or doctor.        STOP taking these medications   ALPRAZolam 0.25 MG tablet Commonly known as: Prudy Feeler Stopped by: Vanna Scotland, MD   Migraine Relief 224 170 1956 MG tablet Generic drug: aspirin-acetaminophen-caffeine Stopped by: Vanna Scotland, MD   ondansetron 4 MG disintegrating tablet Commonly known as: Zofran ODT Stopped by: Vanna Scotland, MD   sertraline 50 MG tablet Commonly known as: ZOLOFT Stopped by: Vanna Scotland, MD     TAKE these medications   b complex vitamins capsule Take 1 capsule by mouth daily.   Biotin 1000 MCG tablet Take 1,000 mcg by mouth 3 (three) times daily.   buPROPion 150 MG 24 hr tablet Commonly known as: WELLBUTRIN XL Take 150 mg by mouth at bedtime.   cholecalciferol 25 MCG (1000 UNIT) tablet Commonly known as: VITAMIN D3 Take 1,000 Units by mouth daily.   Probiotic 250 MG Caps Take by mouth.   vitamin C 100 MG tablet Take 100 mg by mouth daily.   vitamin k 100 MCG tablet Take 100 mcg by mouth daily.   zinc gluconate 50 MG tablet Take 50 mg by mouth daily.       Allergies:  Allergies  Allergen Reactions  . Hydrocodone Anaphylaxis  . Antacid Medicine [Traumeel] Other (See Comments)    High doses of antacids were causing dizziness.  Not sure if she still has a stomach ulcer so her MD d/c'd these meds    Family History: Family History  Problem Relation Age of Onset  . Pancreatic cancer Maternal Grandfather   . Crohn's disease Maternal Grandfather   . Colon cancer Neg  Hx     Social History:  reports that she has never smoked. She has never used smokeless tobacco. She reports current alcohol use. She reports that she does not use drugs.   Physical Exam: BP 126/85   Pulse 81   Constitutional:  Alert and oriented, No acute distress. HEENT: [place] AT, moist mucus membranes.  Trachea midline, no masses. Cardiovascular: No clubbing, cyanosis, or edema. Respiratory: Normal respiratory effort, no increased work of breathing. Skin: No rashes, bruises or suspicious lesions. Neurologic: Grossly intact, no focal deficits, moving all 4 extremities. Psychiatric: Normal mood and affect.  Laboratory Data:  Pertinent Imaging: CLINICAL DATA:  Follow-up cystic renal lesions.  EXAM: MRI ABDOMEN WITHOUT AND WITH CONTRAST  TECHNIQUE: Multiplanar multisequence MR imaging of the abdomen was performed both  before and after the administration of intravenous contrast.  CONTRAST:  7.59mL GADAVIST GADOBUTROL 1 MMOL/ML IV SOLN  COMPARISON:  CT 06/22/2019, MRI 09/23/2019  FINDINGS: Lower chest:  Lung bases are clear.  Hepatobiliary: Postcholecystectomy. No focal hepatic lesion. No biliary duct dilatation. Common bile duct normal  Pancreas: Normal pancreatic intensity. No duct dilatation or inflammation.  Spleen: Normal spleen.  Adrenals/urinary tract: Adrenal glands normal. Again demonstrated multiple cystic lesions in LEFT kidney. Cystic lesion of concern with mild complexity in the lower pole of the LEFT kidney not changed in size measuring 21 mm by 18 mm in axial dimension (image 30/series 4) compared with 21 mm 19 mm comparison MRI. Lesion has a single are colic internal septation which is not changed in dimension. Lesion is hyperintense on T2 weighted imaging. On the precontrast T1 weighted imaging there is a rim of hyperintensity ventrally consistent blood product. Postcontrast imaging demonstrates no measurable enhancement. The septation has  no specific nodularity or enhancement on postcontrast imaging (image 14/7)  There multiple additional cysts within the LEFT kidney which are hyperintense on T2 weighted imaging without post-contrast aspect consistent typical Bosniak 1 renal cysts.  RIGHT kidney is normal. There is a focus of loss signal intensity in the RIGHT renal pelvis measuring 13 mm (image a 25/series 4 and image 26/series 6).  Stomach/Bowel: Stomach and limited of the small bowel is unremarkable  Vascular/Lymphatic: Abdominal aortic normal caliber. No retroperitoneal periportal lymphadenopathy.  Musculoskeletal: No aggressive osseous lesion  IMPRESSION: 1. 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 Due to the complexity, consider follow-up MRI in 12 months. 2. Multiple additional Bosniak 1 renal cysts of the LEFT kidney. 3. Probable new calculus within the RIGHT renal pelvis. Consider CT if clinically indicated.   Electronically Signed   By: Genevive Bi M.D.   On: 10/16/2020 14:21  MRI was personally reviewed today.  Agree with radiologic interpretation.  Renal cysts mildly complex, essentially stable from last year.   Assessment & Plan:    1. Acquired cyst of kidney Renal cyst is stable and unchanged with some complexity.  Recommend follow up MRI in 2 years. Patient agrees.    2. Microscopic hematuria/gross hematuria Strongly recommend cystoscopy for recurrent gross or microscopic hematuria, may be stone related Differential diagnosis was discussed Ultimately she continues to decline   3. Right kidney stone  CT A/P from 05/2019 noted nonobstructive punctate right nephrolithiasis KUB today to assess interval stone size and current location.  We discussed general stone prevention techniques including drinking plenty water with goal of producing 2.5 L urine daily,  increased citric acid intake, avoidance of high oxalate containing foods, and decreased salt intake.  Information about dietary recommendations given today.  Will call with KUB results, if stone of significant size, will discuss possible need for intervention Minimally symptomatic, episodes of gross hematuria with running which may or may not be stone related but denies flank pain  Follow-up in 2 years with repeat MRI or sooner as needed, will call with KUB results  [facility] [address] [place], [place] [address] [phone]  I, Theador Hawthorne, am acting as a scribe for Dr. Vanna Scotland.  I have reviewed the above documentation for accuracy and completeness, and I agree with the above.   Vanna Scotland, MD

## 2020-10-19 ENCOUNTER — Ambulatory Visit
Admission: RE | Admit: 2020-10-19 | Discharge: 2020-10-19 | Disposition: A | Discharge status: Home / Self Care | Payer: 59 | Source: Ambulatory Visit | Attending: Urology | Admitting: Urology

## 2020-10-19 ENCOUNTER — Other Ambulatory Visit: Payer: Self-pay

## 2020-10-19 ENCOUNTER — Ambulatory Visit
Admission: RE | Admit: 2020-10-19 | Discharge: 2020-10-19 | Disposition: A | Discharge status: Home / Self Care | Payer: 59 | Attending: Urology | Admitting: Urology

## 2020-10-19 ENCOUNTER — Ambulatory Visit: Payer: 59 | Admitting: Urology

## 2020-10-19 VITALS — BP 126/85 | HR 81

## 2020-10-19 DIAGNOSIS — R93421 Abnormal radiologic findings on diagnostic imaging of right kidney: Secondary | ICD-10-CM | POA: Diagnosis not present

## 2020-10-19 DIAGNOSIS — N200 Calculus of kidney: Secondary | ICD-10-CM

## 2020-10-19 DIAGNOSIS — I878 Other specified disorders of veins: Secondary | ICD-10-CM | POA: Diagnosis not present

## 2020-10-19 DIAGNOSIS — N2889 Other specified disorders of kidney and ureter: Secondary | ICD-10-CM | POA: Diagnosis not present

## 2020-10-20 ENCOUNTER — Telehealth: Payer: Self-pay

## 2020-10-20 NOTE — Telephone Encounter (Signed)
-----   Message from Vanna Scotland, MD sent at 10/20/2020 10:57 AM EDT ----- Kidney stone is actually quite large, 11 mm and will not pass on its own.  Its probably the source of bleeding with running and eventually is going to start causing pain and/ or infection.  Lets schedule f/u (virtual or in office) to talk about how to treat this stone.    Vanna Scotland, MD

## 2020-10-24 NOTE — Progress Notes (Signed)
Virtual Visit via Video Note  I connected with Barbara Cole on 10/25/2020 at  4:00 PM EDT by a video enabled telemedicine application and verified that I am speaking with the correct person using two identifiers.  Location: Patient: Home  Provider: Office   I discussed the limitations of evaluation and management by telemedicine and the availability of in person appointments. The patient expressed understanding and agreed to proceed.  History of Present Illness: Barbara Cole is a 29 y.o. female who returns for a 1 week follow up of acquired cyst of kidney, gross/microscopic hematuria and right kidney stone. Patient is seen today for discussion regarding stone treatment.   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 Multiple additional Bosniak 1 renal cysts of the LEFT kidney. Probable new calculus within the RIGHT renal pelvis.  During the last visit the patient reported periodic flank and lower abdominal/pelvic pain. She had a cyst on her ovary which she felt causes discomfort. She had amber/tea color blood in urine after running for 30 to 45 minutes. She was very active and participated in boot camp exercises.   KUB from 10/19/2020 revealed right renal calculi.   She has periodic right flank and lower abdominal/pelvic pain. She has occasional hematuria.   Patient continues to stay active and run for 30 to 45 minutes.   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 Patient reports having extreme anxiety and she has concerns about anesthesia.    Observations/Objective: Patient is engaged and asking good questions.   Pertinent image: CLINICAL DATA:  Evaluate for possible stones  EXAM: ABDOMEN - 1 VIEW  COMPARISON:  CT from 06/22/2019, MRI from 10/16/2020  FINDINGS: Scattered large and small bowel gas  is noted. No abnormal mass is noted. Changes of prior cholecystectomy are seen. Lower pole right renal stone is again noted stable from prior CT. Faint rounded calcification is noted over the right renal pelvis measuring approximately 11 mm which corresponds with the filling defect seen on prior MRI. No definitive left renal calculi are seen. Phleboliths are noted in the pelvis. No bony abnormality is seen.  IMPRESSION: Right renal calculi as described above.   Electronically Signed   By: Alcide Clever M.D.   On: 10/19/2020 23:46  I have personally reviewed the images and agree with radiologist interpretation.  I was also able to share my screen and review this image with the patient.   Assessment and Plan:  1. Right renal pelvic stone We discussed various treatment options including ESWL vs. ureteroscopy, laser lithotripsy, and stent. We discussed the risks and benefits of both including bleeding, infection, damage to surrounding structures, efficacy with need for possible further intervention, and need for temporary ureteral stent. Patient provided with literature.   Patient agrees and elected to undergo right ureteroscopy in 12/2020.   2. Intermittent gross hematuria Likely related to # 1. Will assess with cystoscopy at time of ureteroscopy.      I discussed the assessment and treatment plan with the patient. The patient was provided an opportunity to ask questions and all were answered. The patient agreed with the plan and demonstrated an understanding of the instructions.   The patient was advised to call back or seek an in-person evaluation if the symptoms worsen or if the condition fails to improve as anticipated.  I  provided 30 minutes of non-face-to-face time during this encounter.   Georges Mouse, am acting as a scribe for Dr. Vanna Scotland.  I have reviewed the above documentation for accuracy and completeness, and I agree with the above.   Vanna Scotland, MD

## 2020-10-25 ENCOUNTER — Other Ambulatory Visit: Payer: Self-pay

## 2020-10-25 ENCOUNTER — Telehealth (INDEPENDENT_AMBULATORY_CARE_PROVIDER_SITE_OTHER): Payer: 59 | Admitting: Urology

## 2020-10-25 DIAGNOSIS — N200 Calculus of kidney: Secondary | ICD-10-CM

## 2020-10-25 NOTE — Progress Notes (Signed)
This service is provided via telemedicine   No vital signs collected/recorded due to the encounter was a telemedicine visit.     Patient consents to a telephone visit:  yes    Names of all persons participating in the telemedicine service and their role in the encounter:  Illa Enlow, CMA and Ashley Brandon, MD   

## 2020-11-21 ENCOUNTER — Other Ambulatory Visit: Payer: Self-pay | Admitting: Family Medicine

## 2020-11-21 DIAGNOSIS — L298 Other pruritus: Secondary | ICD-10-CM | POA: Diagnosis not present

## 2020-11-26 DIAGNOSIS — Z20822 Contact with and (suspected) exposure to covid-19: Secondary | ICD-10-CM | POA: Diagnosis not present

## 2020-11-26 DIAGNOSIS — J019 Acute sinusitis, unspecified: Secondary | ICD-10-CM | POA: Diagnosis not present

## 2020-11-26 DIAGNOSIS — Z03818 Encounter for observation for suspected exposure to other biological agents ruled out: Secondary | ICD-10-CM | POA: Diagnosis not present

## 2020-11-26 DIAGNOSIS — J029 Acute pharyngitis, unspecified: Secondary | ICD-10-CM | POA: Diagnosis not present

## 2020-11-27 DIAGNOSIS — R509 Fever, unspecified: Secondary | ICD-10-CM | POA: Diagnosis not present

## 2020-12-11 ENCOUNTER — Other Ambulatory Visit: Payer: Self-pay | Admitting: Radiology

## 2020-12-11 DIAGNOSIS — R310 Gross hematuria: Secondary | ICD-10-CM

## 2020-12-11 DIAGNOSIS — N200 Calculus of kidney: Secondary | ICD-10-CM

## 2020-12-18 DIAGNOSIS — F419 Anxiety disorder, unspecified: Secondary | ICD-10-CM | POA: Diagnosis not present

## 2021-01-10 ENCOUNTER — Other Ambulatory Visit: Payer: Self-pay

## 2021-01-10 DIAGNOSIS — N200 Calculus of kidney: Secondary | ICD-10-CM

## 2021-01-11 ENCOUNTER — Telehealth: Payer: Self-pay | Admitting: Urology

## 2021-01-11 NOTE — Telephone Encounter (Signed)
Patient requests surgery be rescheduled on 02/19/2021. Appointments rescheduled accordingly.

## 2021-01-11 NOTE — Telephone Encounter (Signed)
Patient has lab appointment tomorrow for pre-op UA, COVID test next week and surgery on 1/24 with Dr. Apolinar Junes.   Her son has tested positive for COVID today.   Please call her with advice.

## 2021-01-12 ENCOUNTER — Other Ambulatory Visit: Payer: 59

## 2021-01-15 ENCOUNTER — Other Ambulatory Visit: Admission: RE | Admit: 2021-01-15 | Payer: 59 | Source: Ambulatory Visit

## 2021-01-17 DIAGNOSIS — R42 Dizziness and giddiness: Secondary | ICD-10-CM | POA: Diagnosis not present

## 2021-01-17 DIAGNOSIS — U071 COVID-19: Secondary | ICD-10-CM | POA: Diagnosis not present

## 2021-01-18 ENCOUNTER — Other Ambulatory Visit: Payer: 59

## 2021-01-19 ENCOUNTER — Other Ambulatory Visit: Payer: 59

## 2021-01-30 ENCOUNTER — Other Ambulatory Visit: Payer: Self-pay | Admitting: Family Medicine

## 2021-01-30 DIAGNOSIS — J029 Acute pharyngitis, unspecified: Secondary | ICD-10-CM | POA: Diagnosis not present

## 2021-02-08 ENCOUNTER — Other Ambulatory Visit: Payer: Self-pay

## 2021-02-08 DIAGNOSIS — N200 Calculus of kidney: Secondary | ICD-10-CM

## 2021-02-09 ENCOUNTER — Encounter: Payer: Self-pay | Admitting: Urology

## 2021-02-09 ENCOUNTER — Other Ambulatory Visit: Payer: 59

## 2021-02-09 ENCOUNTER — Other Ambulatory Visit: Payer: Self-pay

## 2021-02-09 DIAGNOSIS — N200 Calculus of kidney: Secondary | ICD-10-CM | POA: Diagnosis not present

## 2021-02-09 LAB — URINALYSIS, COMPLETE
Bilirubin, UA: NEGATIVE
Glucose, UA: NEGATIVE
Ketones, UA: NEGATIVE
Leukocytes,UA: NEGATIVE
Nitrite, UA: NEGATIVE
Specific Gravity, UA: >1.03 — ABNORMAL HIGH (ref 1.005–1.030)
Urobilinogen, Ur: 0.2 mg/dL (ref 0.2–1.0)
pH, UA: 5.5 (ref 5.0–7.5)

## 2021-02-09 LAB — MICROSCOPIC EXAMINATION: RBC, Urine: >30 /hpf — AB (ref 0–2)

## 2021-02-12 ENCOUNTER — Encounter
Admission: RE | Admit: 2021-02-12 | Discharge: 2021-02-12 | Disposition: A | Discharge status: Home / Self Care | Payer: 59 | Source: Ambulatory Visit | Attending: Urology | Admitting: Urology

## 2021-02-12 ENCOUNTER — Other Ambulatory Visit: Payer: Self-pay

## 2021-02-12 HISTORY — DX: Headache, unspecified: R51.9

## 2021-02-12 LAB — CULTURE, URINE COMPREHENSIVE

## 2021-02-12 NOTE — Patient Instructions (Signed)
Your procedure is scheduled on:02-19-21 MONDAY Report to the Registration Desk on the 1st floor of the Medical [facility]-Then proceed to the 2nd floor Surgery Desk in the Medical Mall To find out your arrival time, please call [phone] between 1PM - 3PM on:02-16-21 FRIDAY  REMEMBER: Instructions that are not followed completely may result in serious medical risk, up to and including death; or upon the discretion of your surgeon and anesthesiologist your surgery may need to be rescheduled.  Do not eat food or drink liquids after midnight the night before surgery.  No gum chewing, lozengers or hard candies.   TAKE THESE MEDICATIONS THE MORNING OF SURGERY WITH A SIP OF WATER: -NONE  One week prior to surgery: Stop Anti-inflammatories (NSAIDS) such as Advil, Aleve, Ibuprofen, Motrin, Naproxen, Naprosyn and Aspirin based products such as Excedrin, Goodys Powder, BC Powder-OK TO TAKE TYLENOL IF NEEDED  Stop ANY OVER THE COUNTER supplements until after surgery-STOP YOUR BIOTIN, VITAMIN C, VITAMIN K, ANDROGRAPHIS AND BIO QUERCETIN NOW-YOU MAY RESUME AFTER SURGERY (However, you may continue taking Vitamin D, Vitamin B12, Zinc and prenatal vitamin up until the day before surgery.)  No Alcohol for 24 hours before or after surgery.  No Smoking including e-cigarettes for 24 hours prior to surgery.  No chewable tobacco products for at least 6 hours prior to surgery.  No nicotine patches on the day of surgery.  Do not use any "recreational" drugs for at least a week prior to your surgery.  Please be advised that the combination of cocaine and anesthesia may have negative outcomes, up to and including death. If you test positive for cocaine, your surgery will be cancelled.  On the morning of surgery brush your teeth with toothpaste and water, you may rinse your mouth with mouthwash if you wish. Do not swallow any toothpaste or mouthwash.  Do not wear jewelry, make-up, hairpins, clips or nail  polish.  Do not wear lotions, powders, or perfumes.   Do not shave body from the neck down 48 hours prior to surgery just in case you cut yourself which could leave a site for infection.  Also, freshly shaved skin may become irritated if using the CHG soap.  Contact lenses, hearing aids and dentures may not be worn into surgery.  Do not bring valuables to the hospital. [facility] is not responsible for any missing/lost belongings or valuables.   Notify your doctor if there is any change in your medical condition (cold, fever, infection).  Wear comfortable clothing (specific to your surgery type) to the hospital.  Plan for stool softeners for home use; pain medications have a tendency to cause constipation. You can also help prevent constipation by eating foods high in fiber such as fruits and vegetables and drinking plenty of fluids as your diet allows.  After surgery, you can help prevent lung complications by doing breathing exercises.  Take deep breaths and cough every 1-2 hours. Your doctor may order a device called an Incentive Spirometer to help you take deep breaths. When coughing or sneezing, hold a pillow firmly against your incision with both hands. This is called "splinting." Doing this helps protect your incision. It also decreases belly discomfort.  If you are being admitted to the hospital overnight, leave your suitcase in the car. After surgery it may be brought to your room.  If you are being discharged the day of surgery, you will not be allowed to drive home. You will need a responsible adult (18 years or older) to  drive you home and stay with you that night.   If you are taking public transportation, you will need to have a responsible adult (18 years or older) with you. Please confirm with your physician that it is acceptable to use public transportation.   Please call the Pre-admissions Testing Dept. at [phone] if you have any questions about these  instructions.  Visitation Policy:  Patients undergoing a surgery or procedure may have one family member or support person with them as long as that person is not COVID-19 positive or experiencing its symptoms.  That person may remain in the waiting area during the procedure.  Inpatient Visitation:    Visiting hours are 7 a.m. to 8 p.m. Patients will be allowed one visitor. The visitor may change daily. The visitor must pass COVID-19 screenings, use hand sanitizer when entering and exiting the patient's room and wear a mask at all times, including in the patient's room. Patients must also wear a mask when staff or their visitor are in the room. Masking is required regardless of vaccination status. Systemwide, no visitors 17 or younger.

## 2021-02-15 ENCOUNTER — Other Ambulatory Visit: Payer: Self-pay

## 2021-02-15 ENCOUNTER — Other Ambulatory Visit
Admission: RE | Admit: 2021-02-15 | Discharge: 2021-02-15 | Disposition: A | Discharge status: Home / Self Care | Payer: 59 | Source: Ambulatory Visit | Attending: Urology | Admitting: Urology

## 2021-02-15 DIAGNOSIS — Z01812 Encounter for preprocedural laboratory examination: Secondary | ICD-10-CM | POA: Diagnosis not present

## 2021-02-15 DIAGNOSIS — Z20822 Contact with and (suspected) exposure to covid-19: Secondary | ICD-10-CM | POA: Insufficient documentation

## 2021-02-15 LAB — SARS CORONAVIRUS 2 (TAT 6-24 HRS): SARS Coronavirus 2: NEGATIVE

## 2021-02-18 MED ORDER — CHLORHEXIDINE GLUCONATE 0.12 % MT SOLN: 15.0000 mL | Freq: Once | OROMUCOSAL | Status: AC

## 2021-02-18 MED ORDER — CEFAZOLIN SODIUM-DEXTROSE 2-4 GM/100ML-% IV SOLN
2.0000 g | INTRAVENOUS | Status: AC
  Administered 2021-02-19: 2 g via INTRAVENOUS

## 2021-02-18 MED ORDER — FAMOTIDINE 20 MG PO TABS: 20.0000 mg | ORAL_TABLET | Freq: Once | ORAL | Status: AC

## 2021-02-18 MED ORDER — LACTATED RINGERS IV SOLN: INTRAVENOUS | Status: DC

## 2021-02-18 MED ORDER — ORAL CARE MOUTH RINSE: 15.0000 mL | Freq: Once | OROMUCOSAL | Status: AC

## 2021-02-19 ENCOUNTER — Ambulatory Visit: Payer: 59 | Admitting: Registered Nurse

## 2021-02-19 ENCOUNTER — Encounter: Payer: Self-pay | Admitting: Urology

## 2021-02-19 ENCOUNTER — Ambulatory Visit: Payer: 59

## 2021-02-19 ENCOUNTER — Ambulatory Visit
Admission: RE | Admit: 2021-02-19 | Discharge: 2021-02-19 | Disposition: A | Discharge status: Home / Self Care | Payer: 59 | Attending: Urology | Admitting: Urology

## 2021-02-19 ENCOUNTER — Encounter
Admission: RE | Disposition: A | Discharge status: Home / Self Care | Payer: Self-pay | Source: Home / Self Care | Attending: Urology

## 2021-02-19 DIAGNOSIS — N200 Calculus of kidney: Secondary | ICD-10-CM | POA: Insufficient documentation

## 2021-02-19 DIAGNOSIS — R310 Gross hematuria: Secondary | ICD-10-CM | POA: Diagnosis not present

## 2021-02-19 HISTORY — PX: CYSTOSCOPY W/ RETROGRADES: SHX1426

## 2021-02-19 HISTORY — PX: CYSTOSCOPY/URETEROSCOPY/HOLMIUM LASER/STENT PLACEMENT: SHX6546

## 2021-02-19 LAB — POCT PREGNANCY, URINE: Preg Test, Ur: NEGATIVE

## 2021-02-19 SURGERY — CYSTOSCOPY/URETEROSCOPY/HOLMIUM LASER/STENT PLACEMENT
Anesthesia: General | Laterality: Right

## 2021-02-19 MED ORDER — PROPOFOL 10 MG/ML IV BOLUS
INTRAVENOUS | Status: AC
  Filled 2021-02-19: qty 20

## 2021-02-19 MED ORDER — IBUPROFEN 800 MG PO TABS: 800.0000 mg | ORAL_TABLET | Freq: Three times a day (TID) | ORAL | 0 refills | Status: DC | PRN

## 2021-02-19 MED ORDER — DEXAMETHASONE SODIUM PHOSPHATE 10 MG/ML IJ SOLN
INTRAMUSCULAR | Status: AC
  Filled 2021-02-19: qty 1

## 2021-02-19 MED ORDER — IBUPROFEN 800 MG PO TABS: 800.0000 mg | ORAL_TABLET | Freq: Four times a day (QID) | ORAL | Status: DC | PRN

## 2021-02-19 MED ORDER — FAMOTIDINE 20 MG PO TABS
ORAL_TABLET | ORAL | Status: AC
  Administered 2021-02-19: 20 mg via ORAL
  Filled 2021-02-19: qty 1

## 2021-02-19 MED ORDER — FENTANYL CITRATE (PF) 100 MCG/2ML IJ SOLN
INTRAMUSCULAR | Status: DC | PRN
  Administered 2021-02-19: 50 ug via INTRAVENOUS

## 2021-02-19 MED ORDER — SUGAMMADEX SODIUM 200 MG/2ML IV SOLN
INTRAVENOUS | Status: DC | PRN
  Administered 2021-02-19: 200 mg via INTRAVENOUS

## 2021-02-19 MED ORDER — SUCCINYLCHOLINE CHLORIDE 200 MG/10ML IV SOSY
PREFILLED_SYRINGE | INTRAVENOUS | Status: AC
  Filled 2021-02-19: qty 10

## 2021-02-19 MED ORDER — OXYBUTYNIN CHLORIDE 5 MG PO TABS: 5.0000 mg | ORAL_TABLET | Freq: Three times a day (TID) | ORAL | 0 refills | Status: DC | PRN

## 2021-02-19 MED ORDER — IBUPROFEN 400 MG PO TABS
800.0000 mg | ORAL_TABLET | Freq: Once | ORAL | Status: AC
  Administered 2021-02-19: 800 mg via ORAL

## 2021-02-19 MED ORDER — LIDOCAINE HCL (CARDIAC) PF 100 MG/5ML IV SOSY
PREFILLED_SYRINGE | INTRAVENOUS | Status: DC | PRN
  Administered 2021-02-19: 100 mg via INTRAVENOUS

## 2021-02-19 MED ORDER — MIDAZOLAM HCL 2 MG/2ML IJ SOLN
INTRAMUSCULAR | Status: DC | PRN
  Administered 2021-02-19: 2 mg via INTRAVENOUS

## 2021-02-19 MED ORDER — FENTANYL CITRATE (PF) 100 MCG/2ML IJ SOLN
INTRAMUSCULAR | Status: AC
  Filled 2021-02-19: qty 2

## 2021-02-19 MED ORDER — ONDANSETRON HCL 4 MG/2ML IJ SOLN
4.0000 mg | Freq: Once | INTRAMUSCULAR | Status: AC | PRN
  Administered 2021-02-19: 4 mg via INTRAVENOUS

## 2021-02-19 MED ORDER — TAMSULOSIN HCL 0.4 MG PO CAPS: 0.4000 mg | ORAL_CAPSULE | Freq: Every day | ORAL | 0 refills | Status: DC

## 2021-02-19 MED ORDER — IOHEXOL 180 MG/ML  SOLN
INTRAMUSCULAR | Status: DC | PRN
  Administered 2021-02-19: 20 mL

## 2021-02-19 MED ORDER — SUCCINYLCHOLINE CHLORIDE 20 MG/ML IJ SOLN
INTRAMUSCULAR | Status: DC | PRN
  Administered 2021-02-19: 100 mg via INTRAVENOUS

## 2021-02-19 MED ORDER — ACETAMINOPHEN 10 MG/ML IV SOLN
INTRAVENOUS | Status: AC
  Filled 2021-02-19: qty 100

## 2021-02-19 MED ORDER — DEXAMETHASONE SODIUM PHOSPHATE 10 MG/ML IJ SOLN
INTRAMUSCULAR | Status: DC | PRN
  Administered 2021-02-19: 10 mg via INTRAVENOUS

## 2021-02-19 MED ORDER — IBUPROFEN 800 MG PO TABS
800.0000 mg | ORAL_TABLET | Freq: Three times a day (TID) | ORAL | 0 refills | Status: DC | PRN
Start: 2021-02-19 — End: 2021-03-21

## 2021-02-19 MED ORDER — ONDANSETRON HCL 4 MG/2ML IJ SOLN
INTRAMUSCULAR | Status: DC | PRN
  Administered 2021-02-19: 4 mg via INTRAVENOUS

## 2021-02-19 MED ORDER — LIDOCAINE HCL (PF) 2 % IJ SOLN
INTRAMUSCULAR | Status: AC
  Filled 2021-02-19: qty 5

## 2021-02-19 MED ORDER — CHLORHEXIDINE GLUCONATE 0.12 % MT SOLN
OROMUCOSAL | Status: AC
  Administered 2021-02-19: 15 mL via OROMUCOSAL
  Filled 2021-02-19: qty 15

## 2021-02-19 MED ORDER — MIDAZOLAM HCL 2 MG/2ML IJ SOLN
INTRAMUSCULAR | Status: AC
  Filled 2021-02-19: qty 2

## 2021-02-19 MED ORDER — CEFAZOLIN SODIUM-DEXTROSE 2-4 GM/100ML-% IV SOLN
INTRAVENOUS | Status: AC
  Filled 2021-02-19: qty 100

## 2021-02-19 MED ORDER — IBUPROFEN 200 MG PO CAPS: 400.0000 mg | ORAL_CAPSULE | Freq: Three times a day (TID) | ORAL | 0 refills | Status: DC | PRN

## 2021-02-19 MED ORDER — ONDANSETRON HCL 4 MG/2ML IJ SOLN
INTRAMUSCULAR | Status: AC
  Filled 2021-02-19: qty 2

## 2021-02-19 MED ORDER — IBUPROFEN 800 MG PO TABS
ORAL_TABLET | ORAL | Status: AC
  Filled 2021-02-19: qty 1

## 2021-02-19 MED ORDER — PROPOFOL 10 MG/ML IV BOLUS
INTRAVENOUS | Status: DC | PRN
  Administered 2021-02-19: 160 mg via INTRAVENOUS

## 2021-02-19 MED ORDER — ROCURONIUM BROMIDE 100 MG/10ML IV SOLN
INTRAVENOUS | Status: DC | PRN
  Administered 2021-02-19: 20 mg via INTRAVENOUS
  Administered 2021-02-19: 10 mg via INTRAVENOUS
  Administered 2021-02-19: 5 mg via INTRAVENOUS

## 2021-02-19 MED ORDER — FENTANYL CITRATE (PF) 100 MCG/2ML IJ SOLN
25.0000 ug | INTRAMUSCULAR | Status: DC | PRN
  Administered 2021-02-19 (×3): 25 ug via INTRAVENOUS

## 2021-02-19 MED ORDER — PROMETHAZINE HCL 25 MG/ML IJ SOLN
INTRAMUSCULAR | Status: AC
  Administered 2021-02-19: 6.25 mg via INTRAVENOUS
  Filled 2021-02-19: qty 1

## 2021-02-19 MED ORDER — EPHEDRINE 5 MG/ML INJ
INTRAVENOUS | Status: AC
  Filled 2021-02-19: qty 10

## 2021-02-19 MED ORDER — ROCURONIUM BROMIDE 10 MG/ML (PF) SYRINGE
PREFILLED_SYRINGE | INTRAVENOUS | Status: AC
  Filled 2021-02-19: qty 10

## 2021-02-19 MED ORDER — PROMETHAZINE HCL 25 MG/ML IJ SOLN: 6.2500 mg | Freq: Once | INTRAMUSCULAR | Status: AC

## 2021-02-19 MED ORDER — ACETAMINOPHEN 10 MG/ML IV SOLN
INTRAVENOUS | Status: DC | PRN
  Administered 2021-02-19: 1000 mg via INTRAVENOUS

## 2021-02-19 SURGICAL SUPPLY — 34 items
ADH LQ OCL WTPRF AMP STRL LF (MISCELLANEOUS) ×2
ADHESIVE MASTISOL STRL (MISCELLANEOUS) ×1 IMPLANT
BAG DRAIN CYSTO-URO LG1000N (MISCELLANEOUS) ×3 IMPLANT
BASKET ZERO TIP 1.9FR (BASKET) ×1 IMPLANT
BRUSH SCRUB EZ 1% IODOPHOR (MISCELLANEOUS) ×3 IMPLANT
BSKT STON RTRVL ZERO TP 1.9FR (BASKET) ×2
CATH URET FLEX-TIP 2 LUMEN 10F (CATHETERS) ×1 IMPLANT
CATH URETL 5X70 OPEN END (CATHETERS) ×3 IMPLANT
CNTNR SPEC 2.5X3XGRAD LEK (MISCELLANEOUS) ×2
CONT SPEC 4OZ STER OR WHT (MISCELLANEOUS) ×1
CONT SPEC 4OZ STRL OR WHT (MISCELLANEOUS) ×2
CONTAINER SPEC 2.5X3XGRAD LEK (MISCELLANEOUS) IMPLANT
DRAPE UTILITY 15X26 TOWEL STRL (DRAPES) ×3 IMPLANT
GLOVE SURG ENC MOIS LTX SZ6.5 (GLOVE) ×3 IMPLANT
GOWN STRL REUS W/ TWL LRG LVL3 (GOWN DISPOSABLE) ×4 IMPLANT
GOWN STRL REUS W/TWL LRG LVL3 (GOWN DISPOSABLE) ×6
GUIDEWIRE GREEN .038 145CM (MISCELLANEOUS) ×1 IMPLANT
GUIDEWIRE STR DUAL SENSOR (WIRE) ×3 IMPLANT
INFUSOR MANOMETER BAG 3000ML (MISCELLANEOUS) ×3 IMPLANT
INTRODUCER DILATOR DOUBLE (INTRODUCER) IMPLANT
KIT TURNOVER CYSTO (KITS) ×3 IMPLANT
MANIFOLD NEPTUNE II (INSTRUMENTS) ×3 IMPLANT
PACK CYSTO AR (MISCELLANEOUS) ×3 IMPLANT
SET CYSTO W/LG BORE CLAMP LF (SET/KITS/TRAYS/PACK) ×3 IMPLANT
SHEATH URETERAL 12FRX35CM (MISCELLANEOUS) ×1 IMPLANT
SOL .9 NS 3000ML IRR  AL (IV SOLUTION) ×3
SOL .9 NS 3000ML IRR AL (IV SOLUTION) ×2
SOL .9 NS 3000ML IRR UROMATIC (IV SOLUTION) ×2 IMPLANT
STENT URET 6FRX22 CONTOUR (STENTS) ×1 IMPLANT
STENT URET 6FRX24 CONTOUR (STENTS) IMPLANT
STENT URET 6FRX26 CONTOUR (STENTS) IMPLANT
SURGILUBE 2OZ TUBE FLIPTOP (MISCELLANEOUS) ×3 IMPLANT
TRACTIP FLEXIVA PULSE ID 200 (Laser) ×3 IMPLANT
WATER STERILE IRR 1000ML POUR (IV SOLUTION) ×3 IMPLANT

## 2021-02-19 NOTE — Anesthesia Procedure Notes (Signed)
Procedure Name: Intubation Date/Time: 02/19/2021 7:51 AM Performed by: Karoline Caldwell, CRNA Pre-anesthesia Checklist: Patient identified, Patient being monitored, Timeout performed, Emergency Drugs available and Suction available Patient Re-evaluated:Patient Re-evaluated prior to induction Oxygen Delivery Method: Circle system utilized Preoxygenation: Pre-oxygenation with 100% oxygen Induction Type: IV induction Ventilation: Mask ventilation without difficulty Laryngoscope Size: 3 and McGraph Grade View: Grade I Tube type: Oral Tube size: 7.0 mm Number of attempts: 1 Airway Equipment and Method: Stylet Placement Confirmation: ETT inserted through vocal cords under direct vision,  positive ETCO2 and breath sounds checked- equal and bilateral Secured at: 21 cm Tube secured with: Tape Dental Injury: Teeth and Oropharynx as per pre-operative assessment

## 2021-02-19 NOTE — Anesthesia Preprocedure Evaluation (Signed)
Anesthesia Evaluation  Patient identified by MRN, date of birth, ID band Patient awake    Reviewed: Allergy & Precautions, NPO status , Patient's Chart, lab work & pertinent test results  History of Anesthesia Complications Negative for: history of anesthetic complications  Airway Mallampati: II       Dental   Pulmonary neg sleep apnea, neg COPD, Not current smoker,           Cardiovascular (-) hypertension(-) Past MI and (-) CHF (-) dysrhythmias (-) Valvular Problems/Murmurs     Neuro/Psych neg Seizures Anxiety Depression    GI/Hepatic Neg liver ROS, PUD (stress related, off meds now),   Endo/Other  neg diabetes  Renal/GU negative Renal ROS     Musculoskeletal   Abdominal   Peds  Hematology   Anesthesia Other Findings   Reproductive/Obstetrics                             Anesthesia Physical Anesthesia Plan  ASA: II  Anesthesia Plan: General   Post-op Pain Management:    Induction: Intravenous  PONV Risk Score and Plan: 3 and Ondansetron and Dexamethasone  Airway Management Planned: LMA and Oral ETT  Additional Equipment:   Intra-op Plan:   Post-operative Plan:   Informed Consent: I have reviewed the patients History and Physical, chart, labs and discussed the procedure including the risks, benefits and alternatives for the proposed anesthesia with the patient or authorized representative who has indicated his/her understanding and acceptance.       Plan Discussed with:   Anesthesia Plan Comments:         Anesthesia Quick Evaluation

## 2021-02-19 NOTE — H&P (Signed)
02/19/21 7:29 AM   Barbara Cole 01-26-1991 [id number]   HPI: Barbara Cole is a 30 y.o. female who returns for a 1 week follow up of acquired cyst of kidney, gross/microscopic hematuria and right kidney stone. Patient is seen today for discussion regarding stone treatment.   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 Multiple additional Bosniak 1 renal cysts of the LEFT kidney. Probable new calculus within the RIGHT renal pelvis.  During the last visit the patient reported periodic flank and lower abdominal/pelvic pain. She had a cyst on her ovary which she felt causes discomfort. She had amber/tea color blood in urine after running for 30 to 45 minutes. She was very active and participated in boot camp exercises.  KUB from 10/19/2020 revealed right renal calculi.   She has periodic right flank and lower abdominal/pelvic pain. She has occasional hematuria.   Patient continues to stay active and run for 30 to 45 minutes.   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 Patient reports having extreme anxiety and she has concerns about anesthesia.   PMH: Past Medical History:  Diagnosis Date  . Anal fissure   . Anxiety   . Depression   . Headache    MIGRAINES  . History of kidney stones 07/2019   right kidney stone currently  . Stomach ulcer     Surgical History: Past Surgical History:  Procedure Laterality Date  . CHOLECYSTECTOMY N/A 08/25/2019   Procedure: LAPAROSCOPIC CHOLECYSTECTOMY;  Surgeon: Carolan Shiver, MD;  Location: [facility] ORS;  Service: General;  Laterality: N/A;  . COLONOSCOPY  AGE 41  . ESOPHAGOGASTRODUODENOSCOPY (EGD) WITH PROPOFOL N/A 07/12/2019   Procedure: ESOPHAGOGASTRODUODENOSCOPY (EGD) WITH PROPOFOL;  Surgeon: Christena Deem, MD;  Location: [facility] ENDOSCOPY;   Service: Endoscopy;  Laterality: N/A;  . WISDOM TOOTH EXTRACTION      Home Medications:  Current Meds  Medication Sig  . Ascorbic Acid (VITAMIN C PO) Take 1 tablet by mouth 2 (two) times daily.  . Biotin 62376 MCG TABS Take 10,000 mcg by mouth daily.  . Cholecalciferol 100 MCG (4000 UT) CAPS Take 4,000 Units by mouth daily.  . Cyanocobalamin (B-12 PO) Take 1 tablet by mouth daily.  . Ibuprofen 200 MG CAPS Take 400 mg by mouth daily as needed (migraines).  . Multiple Vitamins-Minerals (ZINC PO) Take 1 tablet by mouth daily.  Marland Kitchen OVER THE COUNTER MEDICATION Take 400 mg by mouth daily. Andrographis otc supplement  . OVER THE COUNTER MEDICATION Take 1 tablet by mouth daily. bio quercetin  . Prenatal Vit-Fe Fumarate-FA (PRENATAL PO) Take 1 tablet by mouth daily.  . sertraline (ZOLOFT) 50 MG tablet Take 50 mg by mouth at bedtime.  Marland Kitchen VITAMIN K PO Take 1 capsule by mouth daily.     Allergies:  Allergies  Allergen Reactions  . Hydrocodone Anaphylaxis  . Antacid Medicine [[place]] Other (See Comments)    High doses of antacids were causing dizziness.  Not sure if she still has a stomach ulcer so her MD d/c'd these meds    Family History: Family History  Problem Relation Age of Onset  . Pancreatic cancer Maternal Grandfather   . Crohn's disease Maternal Grandfather   . Colon cancer Neg Hx     Social History:  reports that she has never smoked. She has never used smokeless  tobacco. She reports previous alcohol use. She reports that she does not use drugs.   Physical Exam: BP (!) 151/107   Pulse 90   Temp (!) 97.3 F (36.3 C) (Temporal)   Resp 12   LMP 02/05/2021 (Exact Date)   SpO2 99%   Constitutional:  Alert and oriented, No acute distress. HEENT: [place] AT, moist mucus membranes.  Trachea midline, no masses. Cardiovascular: No clubbing, cyanosis, or edema. Respiratory: Normal respiratory effort, no increased work of breathing. GI: Abdomen is soft, nontender, nondistended, no  abdominal masses Skin: No rashes, bruises or suspicious lesions. Neurologic: Grossly intact, no focal deficits, moving all 4 extremities. Psychiatric: Normal mood and affect.  Laboratory Data: Lab Results  Component Value Date   WBC 7.0 08/15/2019   HGB 14.2 08/15/2019   HCT 42.2 08/15/2019   MCV 89.6 08/15/2019   PLT 267 08/15/2019    Lab Results  Component Value Date   CREATININE 0.65 08/15/2019   Urinalysis    Component Value Date/Time   COLORURINE STRAW (A) 08/15/2019 1519   APPEARANCEUR Hazy (A) 02/09/2021 1534   LABSPEC 1.002 (L) 08/15/2019 1519   PHURINE 6.0 08/15/2019 1519   GLUCOSEU Negative 02/09/2021 1534   HGBUR SMALL (A) 08/15/2019 1519   BILIRUBINUR Negative 02/09/2021 1534   KETONESUR 5 (A) 08/15/2019 1519   PROTEINUR 2+ (A) 02/09/2021 1534   PROTEINUR NEGATIVE 08/15/2019 1519   NITRITE Negative 02/09/2021 1534   NITRITE NEGATIVE 08/15/2019 1519   LEUKOCYTESUR Negative 02/09/2021 1534   LEUKOCYTESUR NEGATIVE 08/15/2019 1519    Lab Results  Component Value Date   LABMICR See below: 02/09/2021   WBCUA 0-5 02/09/2021   LABEPIT 0-10 02/09/2021   MUCUS Present (A) 08/17/2019   BACTERIA Moderate (A) 02/09/2021    Pertinent Imaging: Results for orders placed during the hospital encounter of 10/19/20  DG Abd 1 View  Narrative CLINICAL DATA:  Evaluate for possible stones  EXAM: ABDOMEN - 1 VIEW  COMPARISON:  CT from 06/22/2019, MRI from 10/16/2020  FINDINGS: Scattered large and small bowel gas is noted. No abnormal mass is noted. Changes of prior cholecystectomy are seen. Lower pole right renal stone is again noted stable from prior CT. Faint rounded calcification is noted over the right renal pelvis measuring approximately 11 mm which corresponds with the filling defect seen on prior MRI. No definitive left renal calculi are seen. Phleboliths are noted in the pelvis. No bony abnormality is seen.  IMPRESSION: Right renal calculi as described  above.   Electronically Signed By: Alcide Clever M.D. On: 10/19/2020 23:46   Assessment and Plan:  1. Right renal pelvic stone We discussed various treatment options including ESWL vs. ureteroscopy, laser lithotripsy, and stent. We discussed the risks and benefits of both including bleeding, infection, damage to surrounding structures, efficacy with need for possible further intervention, and need for temporary ureteral stent. Patient provided with literature.   Patient agrees and elected to undergo right ureteroscopy in 01/2021.   2. Intermittent gross hematuria Likely related to # 1. Will assess with cystoscopy at time of ureteroscopy.    Vanna Scotland, MD  [facility] [address], [address] [place], [place] [address] [phone]

## 2021-02-19 NOTE — Transfer of Care (Signed)
Immediate Anesthesia Transfer of Care Note  Patient: Barbara Cole  Procedure(s) Performed: CYSTOSCOPY/URETEROSCOPY/HOLMIUM LASER/STENT PLACEMENT (Right ) CYSTOSCOPY WITH RETROGRADE PYELOGRAM (Bilateral )  Patient Location: PACU  Anesthesia Type:General  Level of Consciousness: awake, alert  and oriented  Airway & Oxygen Therapy: Patient Spontanous Breathing and Patient connected to face mask oxygen  Post-op Assessment: Report given to RN and Post -op Vital signs reviewed and stable  Post vital signs: Reviewed and stable  Last Vitals:  Vitals Value Taken Time  BP 139/92 02/19/21 0904  Temp 36.2 C 02/19/21 0904  Pulse 91 02/19/21 0904  Resp 15 02/19/21 0904  SpO2 100 % 02/19/21 0904  Vitals shown include unvalidated device data.  Last Pain:  Vitals:   02/19/21 0904  TempSrc:   PainSc: 0-No pain         Complications: No complications documented.

## 2021-02-19 NOTE — Op Note (Signed)
Date of procedure: 02/19/21  Preoperative diagnosis:  1. Right renal pelvic stone 2. Gross hematuria  Postoperative diagnosis:  1. Same as above  Procedure: 1. Right ureteroscopy with laser lithotripsy 2. Bilateral retrograde pyelogram 3. Right ureteral stent placement 4. Right basket extraction of stone fragment 5. Interpretation of fluoroscopy less than 30 minutes  Surgeon: Vanna Scotland, MD  Anesthesia: General  Complications: None  Intraoperative findings: Cystoscopy and left retrograde pyelogram unremarkable.  Large filling defect within the right renal pelvis, least 1.5 cm consistent with known renal pelvic stone.  Uneventful ureteroscopy.  Stent left on tether.  EBL: Minimal  Specimens: Stone fragment  Drains: 6 x 22 French double-J ureteral stent on right with tether  Indication: Barbara Cole is a 30 y.o. patient with intermittent gross hematuria found to have a large right renal pelvic stone.  After reviewing the management options for treatment, she elected to proceed with the above surgical procedure(s). We have discussed the potential benefits and risks of the procedure, side effects of the proposed treatment, the likelihood of the patient achieving the goals of the procedure, and any potential problems that might occur during the procedure or recuperation. Informed consent has been obtained.  Description of procedure:  The patient was taken to the operating room and general anesthesia was induced.  The patient was placed in the dorsal lithotomy position, prepped and draped in the usual sterile fashion, and preoperative antibiotics were administered. A preoperative time-out was performed.   A 21 French scope was advanced per urethra into the bladder.  The bladder was carefully inspected and was unremarkable.  There were no masses, tumors, lesions or ulcerations.  Attention was turned to the left ureter orifice which was cannulated using a 5 [place] open-ended  ureteral catheter.  A gentle retrograde pyelogram on this side revealed a delicate appearing ureter and a decompressed collecting system without filling defects.  Attention was then turned to the right UO.  This was cannulated using the same 5 [place] open-ended catheter.  The ureter also appeared delicate on the side but there is a large filling defect within the renal pelvis, now at least 1.5 cm consistent with known stone on the side.  A sensor wire space up to the level of the kidney and a dual-lumen access sheath was used to introduce a Super Stiff wire as a working wire.  The safety wire was snapped in place.  A Cook 12/14 French ureteral access sheath was advanced to the proximal ureter quite easily under fluoroscopic guidance.  The inner lumen and wire were removed.  A digital flexible Wolf ureteroscope was then brought in and advanced to the renal pelvis with a large stone was encountered.  Appeared larger than on previous imaging, now approximately 1.5 cm.  A 242 micro laser fiber was then brought in and using dusting settings of 0.2 J and 40 Hz, the stone was completely dusted.  This created a lot of debris.  The majority of the debris ended up in the upper pole calyx.  Some of this was basketed out using a 1.9 [place] tipless nitinol basket and these fragments were sent off for stone analysis.  Each every calyx was then directly visualized and all stone was pulverized into minute fragments not much larger than the tip of the laser fiber.  A final retrograde pyelogram revealed no filling defects and created roadmap to ensure that each naviculars have been directly visualized.  Once the collecting system was adequately cleared, the scope was  backed out length of the ureter inspecting it and removing the access sheath at the same time.  The ureter appeared in good integrity without significant stone burden or any particular injuries.  Finally, 6 x 22 French double-J ureteral stent was advanced over the wire  up to the level of the renal pelvis.  The wire was partially drawn till full coils noted both within the renal pelvis as well as within the bladder.  The stent string was left attached to the distal coil of the stent which was it fixed to the patient's left inner thigh using muscle and Tegaderm after she was cleaned and dried.  She was then repositioned in supine position, versa myesthesia, and taken to the PACU in stable condition.  Plan: She remove her onset on Thursday.  She will follow-up with [place] in a month with a renal ultrasound prior.  She would like to avoid narcotics postoperatively was prescribed high-dose ibuprofen along with Ditropan and oxybutynin.  Vanna Scotland, M.D.

## 2021-02-19 NOTE — Anesthesia Postprocedure Evaluation (Signed)
Anesthesia Post Note  Patient: Barbara Cole  Procedure(s) Performed: CYSTOSCOPY/URETEROSCOPY/HOLMIUM LASER/STENT PLACEMENT (Right ) CYSTOSCOPY WITH RETROGRADE PYELOGRAM (Bilateral )  Patient location during evaluation: PACU Anesthesia Type: General Level of consciousness: awake and alert and oriented Pain management: pain level controlled Vital Signs Assessment: post-procedure vital signs reviewed and stable Respiratory status: spontaneous breathing Cardiovascular status: blood pressure returned to baseline Anesthetic complications: no   No complications documented.   Last Vitals:  Vitals:   02/19/21 1016 02/19/21 1118  BP: (!) 133/92 137/87  Pulse: 79 82  Resp: 12   Temp:    SpO2: 96% 96%    Last Pain:  Vitals:   02/19/21 1016  TempSrc:   PainSc: 3                  Vernel Langenderfer

## 2021-02-19 NOTE — Discharge Instructions (Signed)
You have a ureteral stent in place.  This is a tube that extends from your kidney to your bladder.  This may cause urinary bleeding, burning with urination, and urinary frequency.  Please call our office or present to the ED if you develop fevers >101 or pain which is not able to be controlled with oral pain medications.  You may be given either Flomax and/ or ditropan to help with bladder spasms and stent pain in addition to pain medications.    Your stent is on a string taped your left inner thigh.  On Thursday morning, you may untape and remove the stent.  If you have any trouble, please call our office for assistance.  [facility] [address], [address] [place], [place] [address] [phone]  AMBULATORY SURGERY  DISCHARGE INSTRUCTIONS   1) The drugs that you were given will stay in your system until tomorrow so for the next 24 hours you should not:  A) Drive an automobile B) Make any legal decisions C) Drink any alcoholic beverage   2) You may resume regular meals tomorrow.  Today it is better to start with liquids and gradually work up to solid foods.  You may eat anything you prefer, but it is better to start with liquids, then soup and crackers, and gradually work up to solid foods.   3) Please notify your doctor immediately if you have any unusual bleeding, trouble breathing, redness and pain at the surgery site, drainage, fever, or pain not relieved by medication.    4) Additional Instructions:   Please contact your physician with any problems or Same Day Surgery at [phone], Monday through Friday 6 am to 4 pm, or [place] at [facility] number at [phone].

## 2021-02-20 ENCOUNTER — Encounter: Payer: Self-pay | Admitting: Urology

## 2021-02-20 ENCOUNTER — Other Ambulatory Visit: Payer: Self-pay | Admitting: Radiology

## 2021-02-20 DIAGNOSIS — N200 Calculus of kidney: Secondary | ICD-10-CM

## 2021-02-20 DIAGNOSIS — R310 Gross hematuria: Secondary | ICD-10-CM

## 2021-02-21 ENCOUNTER — Telehealth: Payer: Self-pay | Admitting: *Deleted

## 2021-02-21 NOTE — Telephone Encounter (Signed)
Patient called Triage line to discuss symptoms post surgery. She was concerned a raised bump/sore on roof of mouth she noticed this morning. No pain or open sore-will try salt water rinse to see if area improves. She noticed she passed some small debris/stones-assured normal when voiding first thing this morning-denies pain, fevers, body aches. Instructed patient to inform the office with any changes. Patient verbalized understanding.

## 2021-02-22 ENCOUNTER — Other Ambulatory Visit: Payer: Self-pay

## 2021-02-22 ENCOUNTER — Ambulatory Visit
Admission: RE | Admit: 2021-02-22 | Discharge: 2021-02-22 | Disposition: A | Discharge status: Home / Self Care | Payer: 59 | Source: Ambulatory Visit | Attending: Urology | Admitting: Urology

## 2021-02-22 ENCOUNTER — Ambulatory Visit (INDEPENDENT_AMBULATORY_CARE_PROVIDER_SITE_OTHER): Payer: 59 | Admitting: Urology

## 2021-02-22 VITALS — BP 150/89 | HR 76

## 2021-02-22 DIAGNOSIS — R109 Unspecified abdominal pain: Secondary | ICD-10-CM

## 2021-02-22 LAB — URINALYSIS, COMPLETE
Bilirubin, UA: NEGATIVE
Glucose, UA: NEGATIVE
Ketones, UA: NEGATIVE
Nitrite, UA: NEGATIVE
Specific Gravity, UA: 1.02 (ref 1.005–1.030)
Urobilinogen, Ur: 0.2 mg/dL (ref 0.2–1.0)
pH, UA: 5.5 (ref 5.0–7.5)

## 2021-02-22 LAB — MICROSCOPIC EXAMINATION: RBC, Urine: >30 /hpf — AB (ref 0–2)

## 2021-02-22 NOTE — Progress Notes (Signed)
02/22/2021 2:34 PM   Barbara Cole [birth year] [id number]  Referring provider: Kandyce Rud, MD [phone] [address] [facility] [place],  [place] [address]  Chief Complaint  Patient presents with  . Flank Pain   Urological history: 1. Nephrolithiasis - s/p right URS for 1.5 cm right pelvic stone  2. Left Bosniak 67F cyst - 09/2020 MRCystic lesion of concern with mild complexity in the lower pole of the LEFT kidney not changed in size measuring 21 mm by 18 mm in axial dimension (image30/series 4) compared with 21 mm 19 mm comparison MRI  3. Bosniak 1 left renal cysts - Multiple additional Bosniak 1 renal cysts of the LEFT kidney   HPI: Barbara Cole is a 30 y.o. female who presented to the office for an emergent visit after removing her ureteral stent last night.  She underwent right ureteroscopy for a 1.5 cm right renal pelvic stone and her ureteral stent was left on a tether.  She was given instructions to remove it this morning, but she removed it yesterday evening as she found it bothersome.  She started to experience intense right-sided flank pain about an hour after removing the stent.  She remove the stent around 8:00 last evening.  She contacted the on-call service and was instructed to take an 800 mg ibuprofen and this was around 10:00 yesterday evening.  She states she was able to sleep, but when she woke up this morning the pain returned and she took another 800 mg of ibuprofen this morning at 730.  She has also taken 800 mg of ibuprofen at 430 that afternoon for a migraine headache.  The pain is starting to diminish some here in the office as standing seems to help with the pain.  UA 11-30 WBC's, > 30 RBC's and few bacteria  KUB no ureteral fragment seen.    PMH: Past Medical History:  Diagnosis Date  . Anal fissure   . Anxiety   . Depression   . Headache    MIGRAINES  . History of kidney stones 07/2019   right  kidney stone currently  . Stomach ulcer     Surgical History: Past Surgical History:  Procedure Laterality Date  . CHOLECYSTECTOMY N/A 08/25/2019   Procedure: LAPAROSCOPIC CHOLECYSTECTOMY;  Surgeon: Carolan Shiver, MD;  Location: [facility] ORS;  Service: General;  Laterality: N/A;  . COLONOSCOPY  AGE 70  . CYSTOSCOPY W/ RETROGRADES Bilateral 02/19/2021   Procedure: CYSTOSCOPY WITH RETROGRADE PYELOGRAM;  Surgeon: Vanna Scotland, MD;  Location: [facility] ORS;  Service: Urology;  Laterality: Bilateral;  . CYSTOSCOPY/URETEROSCOPY/HOLMIUM LASER/STENT PLACEMENT Right 02/19/2021   Procedure: CYSTOSCOPY/URETEROSCOPY/HOLMIUM LASER/STENT PLACEMENT;  Surgeon: Vanna Scotland, MD;  Location: [facility] ORS;  Service: Urology;  Laterality: Right;  . ESOPHAGOGASTRODUODENOSCOPY (EGD) WITH PROPOFOL N/A 07/12/2019   Procedure: ESOPHAGOGASTRODUODENOSCOPY (EGD) WITH PROPOFOL;  Surgeon: Christena Deem, MD;  Location: [facility] ENDOSCOPY;  Service: Endoscopy;  Laterality: N/A;  . WISDOM TOOTH EXTRACTION      Home Medications:  Allergies as of 02/22/2021      Reactions   Hydrocodone Anaphylaxis   Antacid Medicine [traumeel] Other (See Comments)   High doses of antacids were causing dizziness.  Not sure if she still has a stomach ulcer so her MD d/c'd these meds      Medication List       Accurate as of February 22, 2021 11:59 PM. If you have any questions, ask your nurse or doctor.  STOP taking these medications   OVER THE COUNTER MEDICATION Stopped by: Michiel Cowboy, PA-C     TAKE these medications   B-12 PO Take 1 tablet by mouth daily.   Biotin [address] MCG Tabs Take 10,000 mcg by mouth daily.   Cholecalciferol 100 MCG (4000 UT) Caps Take 4,000 Units by mouth daily.   ibuprofen 800 MG tablet Commonly known as: ADVIL Take 1 tablet (800 mg total) by mouth every 8 (eight) hours as needed. What changed: Another medication with the same name was removed. Continue taking this medication, and follow  the directions you see here. Changed by: Michiel Cowboy, PA-C   OVER THE COUNTER MEDICATION Take 400 mg by mouth daily. Andrographis otc supplement   oxybutynin 5 MG tablet Commonly known as: DITROPAN Take 1 tablet (5 mg total) by mouth every 8 (eight) hours as needed for bladder spasms.   PRENATAL PO Take 1 tablet by mouth daily.   sertraline 50 MG tablet Commonly known as: ZOLOFT Take 50 mg by mouth at bedtime.   tamsulosin 0.4 MG Caps capsule Commonly known as: Flomax Take 1 capsule (0.4 mg total) by mouth daily.   VITAMIN C PO Take 1 tablet by mouth 2 (two) times daily.   VITAMIN K PO Take 1 capsule by mouth daily.   ZINC PO Take 1 tablet by mouth daily.       Allergies:  Allergies  Allergen Reactions  . Hydrocodone Anaphylaxis  . Antacid Medicine [Traumeel] Other (See Comments)    High doses of antacids were causing dizziness.  Not sure if she still has a stomach ulcer so her MD d/c'd these meds    Family History: Family History  Problem Relation Age of Onset  . Pancreatic cancer Maternal Grandfather   . Crohn's disease Maternal Grandfather   . Colon cancer Neg Hx     Social History:  reports that she has never smoked. She has never used smokeless tobacco. She reports previous alcohol use. She reports that she does not use drugs.  ROS: Pertinent ROS in HPI  Physical Exam: BP (!) 150/89   Pulse 76   LMP 02/05/2021 (Exact Date)   Constitutional:  Well nourished. Alert and oriented, No acute distress. HEENT: [place] AT, mask in place.  Trachea midline Cardiovascular: No clubbing, cyanosis, or edema. Respiratory: Normal respiratory effort, no increased work of breathing. GU: Right CVA tenderness.   Neurologic: Grossly intact, no focal deficits, moving all 4 extremities. Psychiatric: Normal mood and affect.  Laboratory Data: Urinalysis Component     Latest Ref Rng & Units 02/22/2021  Specific Gravity, UA     1.005 - 1.030 1.020  pH, UA     5.0 - 7.5  5.5  Color, UA     Yellow Yellow  Appearance Ur     Clear Cloudy (A)  Leukocytes,UA     Negative Trace (A)  Protein,UA     Negative/Trace 2+ (A)  Glucose, UA     Negative Negative  Ketones, UA     Negative Negative  RBC, UA     Negative 3+ (A)  Bilirubin, UA     Negative Negative  Urobilinogen, Ur     0.2 - 1.0 mg/dL 0.2  Nitrite, UA     Negative Negative  Microscopic Examination      See below:   Component     Latest Ref Rng & Units 02/22/2021  Cast Type     N/A Granular casts (A)   I have reviewed  the labs.   Pertinent Imaging: Narrative & Impression  CLINICAL DATA:  Right flank pain  EXAM: ABDOMEN - 1 VIEW  COMPARISON:  10/19/2020  FINDINGS: The bowel gas pattern is normal. No radio-opaque calculi are identified projecting over either renal shadow. Numerous bilateral pelvic phleboliths similar in distribution to the previous study. Cholecystectomy clips. Osseous structures unremarkable.  IMPRESSION: No radiographic evidence of nephrolithiasis.   Electronically Signed   By: Duanne Guess D.O.   On: 02/23/2021 14:27  I have independently reviewed the films.  See HPI.   Assessment & Plan:    1. Flank pain - secondary to ureteral spasms after removal of stent - DG Abd 1 View; Future - fragments seen - patient's pain dissipated during her time in the office  - urine sent for culture for completeness    Return for keep scheduled follow ups .  These notes generated with voice recognition software. I apologize for typographical errors.  Michiel Cowboy, PA-C  [facility] [address]  [address] [place], [place] [address] [phone]

## 2021-02-23 LAB — CALCULI, WITH PHOTOGRAPH (CLINICAL LAB)
Uric Acid Calculi: 100 %
Weight Calculi: 7 mg

## 2021-02-25 LAB — CULTURE, URINE COMPREHENSIVE

## 2021-02-26 ENCOUNTER — Telehealth: Payer: Self-pay | Admitting: *Deleted

## 2021-02-26 ENCOUNTER — Other Ambulatory Visit: Payer: Self-pay | Admitting: Urology

## 2021-02-26 MED ORDER — CIPROFLOXACIN HCL 500 MG PO TABS: 500.0000 mg | ORAL_TABLET | Freq: Two times a day (BID) | ORAL | 0 refills | Status: DC

## 2021-02-26 NOTE — Telephone Encounter (Signed)
-----   Message from Harle Battiest, PA-C sent at 02/25/2021  8:29 PM EST ----- Please let Mrs. Mermelstein know that her urine culture was positive for infection.  She needs to start Cipro 500 mg, twice daily x 7 days.

## 2021-02-26 NOTE — Telephone Encounter (Signed)
Notified patient as instructed, patient pleased. Discussed follow-up appointments, patient agrees  

## 2021-03-16 ENCOUNTER — Encounter: Payer: Self-pay | Admitting: Urology

## 2021-03-20 ENCOUNTER — Other Ambulatory Visit: Payer: Self-pay

## 2021-03-20 ENCOUNTER — Ambulatory Visit
Admission: RE | Admit: 2021-03-20 | Discharge: 2021-03-20 | Disposition: A | Discharge status: Home / Self Care | Payer: 59 | Source: Ambulatory Visit | Attending: Urology | Admitting: Urology

## 2021-03-20 DIAGNOSIS — R310 Gross hematuria: Secondary | ICD-10-CM | POA: Diagnosis not present

## 2021-03-20 DIAGNOSIS — Z87442 Personal history of urinary calculi: Secondary | ICD-10-CM | POA: Diagnosis not present

## 2021-03-20 DIAGNOSIS — N200 Calculus of kidney: Secondary | ICD-10-CM | POA: Diagnosis not present

## 2021-03-20 DIAGNOSIS — N281 Cyst of kidney, acquired: Secondary | ICD-10-CM | POA: Diagnosis not present

## 2021-03-21 ENCOUNTER — Encounter: Payer: Self-pay | Admitting: Urology

## 2021-03-21 ENCOUNTER — Ambulatory Visit: Payer: 59 | Admitting: Urology

## 2021-03-21 VITALS — BP 133/86 | HR 96 | Ht 62.0 in | Wt 180.0 lb

## 2021-03-21 DIAGNOSIS — R109 Unspecified abdominal pain: Secondary | ICD-10-CM | POA: Diagnosis not present

## 2021-03-21 DIAGNOSIS — N281 Cyst of kidney, acquired: Secondary | ICD-10-CM | POA: Diagnosis not present

## 2021-03-21 DIAGNOSIS — N200 Calculus of kidney: Secondary | ICD-10-CM

## 2021-03-21 NOTE — Addendum Note (Signed)
Addended by: Milas Kocher A on: 03/21/2021 03:07 PM   Modules accepted: Orders

## 2021-03-21 NOTE — Addendum Note (Signed)
Addended by: Milas Kocher A on: 03/21/2021 03:04 PM   Modules accepted: Orders

## 2021-03-21 NOTE — Progress Notes (Addendum)
03/21/2021 2:16 PM   Barbara Cole 11-01-1991 [id number]  Referring provider: Kandyce Rud, MD [phone] [address] [facility] [place],  [place] [address]  Chief Complaint  Patient presents with  . Nephrolithiasis    HPI: 30 year old female with purse history of kidney stones returns today following ureteroscopy.  She was taken to the OR on 02/19/2019 for large right renal pelvic stone (1.5 cm).  Procedure was uncomplicated.  She removed around stent postop.  She did have some flank pain following stent removal.    She was treated postoperatively urinary tract infection.  She did not end up completing the course of antibiotics.  She reports that about 24 hours after stent was removed, her urinary symptoms almost completely resolved and that she did not feel like she needs antibiotic any further.  Today, she reports doing well.  She no longer has any flank pain on her right side.  She does mention that she developed some flank pain on her left side which is more dull in nature compared to the pain on her right but she was worried that she may have a stone on the left.  Follow-up renal ultrasound shows a known Bosniak 56F renal cyst on the contralateral side.  On the right side, no further hydronephrosis or stone fragments appreciated.    PMH: Past Medical History:  Diagnosis Date  . Anal fissure   . Anxiety   . Depression   . Headache    MIGRAINES  . History of kidney stones 07/2019   right kidney stone currently  . Stomach ulcer     Surgical History: Past Surgical History:  Procedure Laterality Date  . CHOLECYSTECTOMY N/A 08/25/2019   Procedure: LAPAROSCOPIC CHOLECYSTECTOMY;  Surgeon: Carolan Shiver, MD;  Location: [facility] ORS;  Service: General;  Laterality: N/A;  . COLONOSCOPY  AGE 1  . CYSTOSCOPY W/ RETROGRADES Bilateral 02/19/2021   Procedure: CYSTOSCOPY WITH RETROGRADE PYELOGRAM;  Surgeon: Vanna Scotland, MD;   Location: [facility] ORS;  Service: Urology;  Laterality: Bilateral;  . CYSTOSCOPY/URETEROSCOPY/HOLMIUM LASER/STENT PLACEMENT Right 02/19/2021   Procedure: CYSTOSCOPY/URETEROSCOPY/HOLMIUM LASER/STENT PLACEMENT;  Surgeon: Vanna Scotland, MD;  Location: [facility] ORS;  Service: Urology;  Laterality: Right;  . ESOPHAGOGASTRODUODENOSCOPY (EGD) WITH PROPOFOL N/A 07/12/2019   Procedure: ESOPHAGOGASTRODUODENOSCOPY (EGD) WITH PROPOFOL;  Surgeon: Christena Deem, MD;  Location: [facility] ENDOSCOPY;  Service: Endoscopy;  Laterality: N/A;  . WISDOM TOOTH EXTRACTION      Home Medications:  Allergies as of 03/21/2021      Reactions   Hydrocodone Anaphylaxis   Antacid Medicine [traumeel] Other (See Comments)   High doses of antacids were causing dizziness.  Not sure if she still has a stomach ulcer so her MD d/c'd these meds      Medication List       Accurate as of March 21, 2021  2:16 PM. If you have any questions, ask your nurse or doctor.        STOP taking these medications   B-12 PO Stopped by: Vanna Scotland, MD   Biotin 38101 MCG Tabs Stopped by: Vanna Scotland, MD   Cholecalciferol 100 MCG (4000 UT) Caps Stopped by: Vanna Scotland, MD   ibuprofen 800 MG tablet Commonly known as: ADVIL Stopped by: Vanna Scotland, MD   OVER THE COUNTER MEDICATION Stopped by: Vanna Scotland, MD   oxybutynin 5 MG tablet Commonly known as: DITROPAN Stopped by: Vanna Scotland, MD   PRENATAL PO Stopped by: Vanna Scotland, MD   sertraline  50 MG tablet Commonly known as: ZOLOFT Stopped by: Vanna Scotland, MD   tamsulosin 0.4 MG Caps capsule Commonly known as: Flomax Stopped by: Vanna Scotland, MD   VITAMIN C PO Stopped by: Vanna Scotland, MD   ZINC PO Stopped by: Vanna Scotland, MD     TAKE these medications   VITAMIN K PO Take 1 capsule by mouth daily.       Allergies:  Allergies  Allergen Reactions  . Hydrocodone Anaphylaxis  . Antacid Medicine [Traumeel] Other (See Comments)    High  doses of antacids were causing dizziness.  Not sure if she still has a stomach ulcer so her MD d/c'd these meds    Family History: Family History  Problem Relation Age of Onset  . Pancreatic cancer Maternal Grandfather   . Crohn's disease Maternal Grandfather   . Colon cancer Neg Hx     Social History:  reports that she has never smoked. She has never used smokeless tobacco. She reports previous alcohol use. She reports that she does not use drugs.   Physical Exam: BP 133/86   Pulse 96   Constitutional:  Alert and oriented, No acute distress. HEENT: [place] AT, moist mucus membranes.  Trachea midline, no masses. Cardiovascular: No clubbing, cyanosis, or edema. Respiratory: Normal respiratory effort, no increased work of breathing. Skin: No rashes, bruises or suspicious lesions. Neurologic: Grossly intact, no focal deficits, moving all 4 extremities. Psychiatric: Normal mood and affect.  Laboratory Data: Lab Results  Component Value Date   WBC 7.0 08/15/2019   HGB 14.2 08/15/2019   HCT 42.2 08/15/2019   MCV 89.6 08/15/2019   PLT 267 08/15/2019    Lab Results  Component Value Date   CREATININE 0.65 08/15/2019   Urinalysis Pending  Pertinent Imaging: Ultrasound renal complete  Narrative CLINICAL DATA:  Right-sided nephrolithiasis  EXAM: RENAL / URINARY TRACT ULTRASOUND COMPLETE  COMPARISON:  Abdominal MRI October 16, 2020.  FINDINGS: Right Kidney:  Renal measurements: 12.4 x 5.5 x 5.3 cm = volume: 189.2 mL. Echogenicity and renal cortical thickness are within normal limits. No mass, perinephric fluid, or hydronephrosis visualized. Apparent column of Bertin on the right, an anatomic variant. No sonographically demonstrable calculus or ureterectasis.  Left Kidney:  Renal measurements: 13.0 x 5.9 x 5.2 cm = volume: 207.4 mL. Echogenicity and renal cortical thickness within normal limits. No perinephric fluid or hydronephrosis visualized. There is a cyst in the  upper pole of the left kidney measuring 2.5 x 2.5 x 1.8 cm. There is a cyst in the lower pole left kidney measuring 2.3 x 2.0 x 2.0 cm. A cyst in the medial mid left kidney measuring 2.3 x 2.6 x 1.6 cm is noted. This cyst contains small septations. No sonographically demonstrable calculus or ureterectasis.  Bladder:  Appears normal for degree of bladder distention.  Other:  None.  IMPRESSION: Cysts in left kidney. One of these cysts shows mild septation. Study otherwise unremarkable.   Electronically Signed By: Bretta Bang III M.D. On: 03/21/2021 10:16  Renal ultrasound was personally reviewed.  Agree with radiologic interpretation.  Assessment & Plan:    1. Right kidney stone Status post uncomplicated right ureteroscopy  She does have a small fragment at home, advised her if she is interested in knowing her stone composition, she can bring it for stone analysis  Renal ultrasound is reassuring, no hydronephrosis which is overall stone burden  Reviewed stone diet recommendations again today - Urinalysis, Complete  2. Left flank pain No stones  or any clear etiology for left flank discomfort  We will check a urine today to ensure that she does not have any smoldering infection, she is agreeable this plan  May be MSK related given that she started working out again  3. Acquired cyst of kidney Bosniak 19F renal cyst, as per previous recommendations, she will follow up with an MRI in October 2023 with MRI   Vanna Scotland, MD  [facility] [address] [place], [place] [address] [phone]   Addendum: Urine with 6-10 white blood cells, 3-10 red blood cells many bacteria although the dip is fairly bland.  Plan to send urine culture and treat as needed.

## 2021-03-22 LAB — MICROSCOPIC EXAMINATION

## 2021-03-22 LAB — URINALYSIS, COMPLETE
Bilirubin, UA: NEGATIVE
Glucose, UA: NEGATIVE
Ketones, UA: NEGATIVE
Nitrite, UA: NEGATIVE
Protein,UA: NEGATIVE
RBC, UA: NEGATIVE
Specific Gravity, UA: >1.03 — ABNORMAL HIGH (ref 1.005–1.030)
Urobilinogen, Ur: 0.2 mg/dL (ref 0.2–1.0)
pH, UA: 5 (ref 5.0–7.5)

## 2021-03-25 LAB — CULTURE, URINE COMPREHENSIVE

## 2021-05-23 ENCOUNTER — Other Ambulatory Visit: Payer: Self-pay

## 2021-05-23 MED ORDER — VALACYCLOVIR HCL 1 G PO TABS
ORAL_TABLET | ORAL | 0 refills | Status: DC
  Filled 2021-05-23: qty 14, 7d supply, fill #0

## 2021-06-04 ENCOUNTER — Other Ambulatory Visit: Payer: Self-pay

## 2021-06-25 DIAGNOSIS — Z79899 Other long term (current) drug therapy: Secondary | ICD-10-CM | POA: Diagnosis not present

## 2021-06-25 DIAGNOSIS — F419 Anxiety disorder, unspecified: Secondary | ICD-10-CM | POA: Diagnosis not present

## 2021-07-16 DIAGNOSIS — H5203 Hypermetropia, bilateral: Secondary | ICD-10-CM | POA: Diagnosis not present

## 2021-10-05 ENCOUNTER — Other Ambulatory Visit: Payer: Self-pay

## 2021-10-05 DIAGNOSIS — B001 Herpesviral vesicular dermatitis: Secondary | ICD-10-CM | POA: Diagnosis not present

## 2021-10-05 MED ORDER — VALACYCLOVIR HCL 1 G PO TABS
ORAL_TABLET | ORAL | 11 refills | Status: DC
  Filled 2021-10-05: qty 20, 10d supply, fill #0

## 2021-10-24 DIAGNOSIS — R509 Fever, unspecified: Secondary | ICD-10-CM | POA: Diagnosis not present

## 2021-10-24 DIAGNOSIS — R1084 Generalized abdominal pain: Secondary | ICD-10-CM | POA: Diagnosis not present

## 2021-10-24 DIAGNOSIS — R197 Diarrhea, unspecified: Secondary | ICD-10-CM | POA: Diagnosis not present

## 2021-10-24 DIAGNOSIS — R110 Nausea: Secondary | ICD-10-CM | POA: Diagnosis not present

## 2021-10-24 DIAGNOSIS — N83201 Unspecified ovarian cyst, right side: Secondary | ICD-10-CM | POA: Diagnosis not present

## 2021-10-24 DIAGNOSIS — R102 Pelvic and perineal pain: Secondary | ICD-10-CM | POA: Diagnosis not present

## 2021-10-24 DIAGNOSIS — R1031 Right lower quadrant pain: Secondary | ICD-10-CM | POA: Diagnosis not present

## 2021-12-01 ENCOUNTER — Other Ambulatory Visit: Payer: Self-pay

## 2021-12-01 ENCOUNTER — Ambulatory Visit
Admission: EM | Admit: 2021-12-01 | Discharge: 2021-12-01 | Disposition: A | Discharge status: Home / Self Care | Payer: 59 | Attending: Family Medicine | Admitting: Family Medicine

## 2021-12-01 DIAGNOSIS — U071 COVID-19: Secondary | ICD-10-CM | POA: Diagnosis not present

## 2021-12-01 DIAGNOSIS — R519 Headache, unspecified: Secondary | ICD-10-CM

## 2021-12-01 DIAGNOSIS — J029 Acute pharyngitis, unspecified: Secondary | ICD-10-CM

## 2021-12-01 LAB — RESP PANEL BY RT-PCR (FLU A&B, COVID) ARPGX2
Influenza A by PCR: NEGATIVE
Influenza B by PCR: NEGATIVE
SARS Coronavirus 2 by RT PCR: POSITIVE — AB

## 2021-12-01 LAB — GROUP A STREP BY PCR: Group A Strep by PCR: NOT DETECTED

## 2021-12-01 NOTE — ED Provider Notes (Addendum)
[place]   [id number] 12/01/21 Arrival Time: [id number]  ASSESSMENT & PLAN:  1. Acute nonintractable headache, unspecified headache type   2. Sore throat    Discussed typical duration of viral illnesses. COVID-19/flu testing pending. Rapid strep negative. OTC symptom care as needed.    Discharge Instructions      You have been tested for COVID-19/influenza today. If your test returns positive, you will receive a phone call from [facility] regarding your results. Negative test results are not called. Both positive and negative results area always visible on MyChart. If you do not have a MyChart account, sign up instructions are provided in your discharge papers. Please do not hesitate to contact us should you have questions or concerns.        Follow-up Information     Derinda Late, MD.   Specialty: Family Medicine Why: As needed. Contact information: [address] [facility] [place] [address] [phone]                 Reviewed expectations re: course of current medical issues. Questions answered. Outlined signs and symptoms indicating need for more acute intervention. Understanding verbalized. After Visit Summary given.   SUBJECTIVE: History from: patient. Barbara Cole is a 30 y.o. female who reports:COVID/strep expsoure; with HA and mild ST; 1-2 d. Denies: fever. Normal PO intake without n/v/d.   OBJECTIVE:  Vitals:   12/01/21 0856 12/01/21 0858  BP:  130/90  Pulse:  (!) 58  Resp:  18  Temp:  99.1 F (37.3 C)  TempSrc:  Oral  SpO2:  99%  Weight: 96.2 kg   Height: 5\' 2"  (1.575 m)     General appearance: alert; no distress Eyes: PERRLA; EOMI; conjunctiva normal HENT: [place]; AT; with mild nasal congestion and mild throat irritation Neck: supple  Lungs: speaks full sentences without difficulty; unlabored Extremities: no edema Skin: warm and dry Neurologic: normal  gait Psychological: alert and cooperative; normal mood and affect  Labs: Results for orders placed or performed during the hospital encounter of 12/01/21  Group A Strep by PCR   Specimen: Throat; Sterile Swab  Result Value Ref Range   Group A Strep by PCR NOT DETECTED NOT DETECTED   Labs Reviewed  GROUP A STREP BY PCR  RESP PANEL BY RT-PCR (FLU A&B, COVID) [id number]     Allergies  Allergen Reactions   Hydrocodone Anaphylaxis   Antacid Medicine [Traumeel] Other (See Comments)    High doses of antacids were causing dizziness.  Not sure if she still has a stomach ulcer so her MD d/c'd these meds    Past Medical History:  Diagnosis Date   Anal fissure    Anxiety    Depression    Headache    MIGRAINES   History of kidney stones 07/2019   right kidney stone currently   Stomach ulcer    Social History   Socioeconomic History   Marital status: Married    Spouse name: Dellis Filbert   Number of children: 2   Years of education: Not on file   Highest education level: Not on file  Occupational History   Occupation: owner of salon  Tobacco Use   Smoking status: Never   Smokeless tobacco: Never  Vaping Use   Vaping Use: Never used  Substance and Sexual Activity   Alcohol use: Not Currently   Drug use: Never   Sexual activity: Not on file  Other Topics Concern  Not on file  Social History Narrative   Right handed   Social Determinants of Health   Financial Resource Strain: Not on file  Food Insecurity: Not on file  Transportation Needs: Not on file  Physical Activity: Not on file  Stress: Not on file  Social Connections: Not on file  Intimate Partner Violence: Not on file   Family History  Problem Relation Age of Onset   Pancreatic cancer Maternal Grandfather    Crohn's disease Maternal Grandfather    Colon cancer Neg Hx    Past Surgical History:  Procedure Laterality Date   CHOLECYSTECTOMY N/A 08/25/2019   Procedure: LAPAROSCOPIC CHOLECYSTECTOMY;  Surgeon:  Carolan Shiver, MD;  Location: [facility] ORS;  Service: General;  Laterality: N/A;   COLONOSCOPY  AGE 40   CYSTOSCOPY W/ RETROGRADES Bilateral 02/19/2021   Procedure: CYSTOSCOPY WITH RETROGRADE PYELOGRAM;  Surgeon: Vanna Scotland, MD;  Location: [facility] ORS;  Service: Urology;  Laterality: Bilateral;   CYSTOSCOPY/URETEROSCOPY/HOLMIUM LASER/STENT PLACEMENT Right 02/19/2021   Procedure: CYSTOSCOPY/URETEROSCOPY/HOLMIUM LASER/STENT PLACEMENT;  Surgeon: Vanna Scotland, MD;  Location: [facility] ORS;  Service: Urology;  Laterality: Right;   ESOPHAGOGASTRODUODENOSCOPY (EGD) WITH PROPOFOL N/A 07/12/2019   Procedure: ESOPHAGOGASTRODUODENOSCOPY (EGD) WITH PROPOFOL;  Surgeon: Christena Deem, MD;  Location: [facility] ENDOSCOPY;  Service: Endoscopy;  Laterality: N/A;   WISDOM TOOTH EXTRACTION       Mardella Layman, MD 12/01/21 8413    Mardella Layman, MD 12/01/21 1001

## 2021-12-01 NOTE — ED Triage Notes (Signed)
Pt c/o sore throat, headache x2days. Pt has been around her daughter who has covid and her niece and nephew who has had strep on Monday. Pt asks to be swabbed for both covid and strep throat.

## 2021-12-01 NOTE — Discharge Instructions (Signed)
You have been tested for COVID-19/influenza today. If your test returns positive, you will receive a phone call from [facility] regarding your results. Negative test results are not called. Both positive and negative results area always visible on MyChart. If you do not have a MyChart account, sign up instructions are provided in your discharge papers. Please do not hesitate to contact us should you have questions or concerns.  

## 2022-02-15 DIAGNOSIS — J029 Acute pharyngitis, unspecified: Secondary | ICD-10-CM | POA: Diagnosis not present

## 2022-02-15 DIAGNOSIS — R509 Fever, unspecified: Secondary | ICD-10-CM | POA: Diagnosis not present

## 2022-02-19 ENCOUNTER — Other Ambulatory Visit: Payer: Self-pay

## 2022-02-19 MED ORDER — POLYMYXIN B-TRIMETHOPRIM 10000-0.1 UNIT/ML-% OP SOLN
OPHTHALMIC | 0 refills | Status: DC
  Filled 2022-02-19: qty 10, 12d supply, fill #0

## 2022-03-11 DIAGNOSIS — Z79899 Other long term (current) drug therapy: Secondary | ICD-10-CM | POA: Diagnosis not present

## 2022-03-18 ENCOUNTER — Other Ambulatory Visit: Payer: Self-pay

## 2022-03-18 DIAGNOSIS — Z1331 Encounter for screening for depression: Secondary | ICD-10-CM | POA: Diagnosis not present

## 2022-03-18 DIAGNOSIS — Z0000 Encounter for general adult medical examination without abnormal findings: Secondary | ICD-10-CM | POA: Diagnosis not present

## 2022-03-18 MED ORDER — BUPROPION HCL ER (XL) 150 MG PO TB24
ORAL_TABLET | ORAL | 1 refills | Status: DC
  Filled 2022-03-18: qty 30, 30d supply, fill #0

## 2022-03-18 MED ORDER — SERTRALINE HCL 50 MG PO TABS
50.0000 mg | ORAL_TABLET | Freq: Every day | ORAL | 1 refills | Status: DC
  Filled 2022-03-18: qty 90, 90d supply, fill #0

## 2022-03-26 ENCOUNTER — Ambulatory Visit: Payer: Self-pay | Admitting: Urology

## 2022-04-16 NOTE — Progress Notes (Signed)
? ?04/17/2022 ?10:13 AM  ? ?Barbara Cole ?06/30/1991 ?[id number] ? ?Referring provider:  ?Kandyce Rud, MD ?[address] ?[facility] ?[place],  [place] [address] ?Chief Complaint  ?Patient presents with  ? Hematuria  ?  History of stones  ? ? ?HPI: ?Barbara Cole is a 31 y.o.female with a personal history of kidney stones who presents today for further evaluation of hematuria.  ? ?She was taken to the OR on 02/19/2019 for large right renal pelvic stone (1.5 cm).  Procedure was uncomplicated.  ? ?Her most recent upper tract imaging was an RUS in 02/2021 that visualized cyst in left kidney that was mildly septated.  ? ?KUB was personally reviewed and showed no stone burden.  ? ?She reports that she thinks she may have a stone, she has had some flank pain and hematuria over the past week.  Symptoms are consistent with her previous stone pain.  No fevers or chills.  No urinary tract symptoms. ? ?PMH: ?Past Medical History:  ?Diagnosis Date  ? Anal fissure   ? Anxiety   ? Depression   ? Headache   ? MIGRAINES  ? History of kidney stones 07/2019  ? right kidney stone currently  ? Stomach ulcer   ? ? ?Surgical History: ?Past Surgical History:  ?Procedure Laterality Date  ? CHOLECYSTECTOMY N/A 08/25/2019  ? Procedure: LAPAROSCOPIC CHOLECYSTECTOMY;  Surgeon: Carolan Shiver, MD;  Location: [facility] ORS;  Service: General;  Laterality: N/A;  ? COLONOSCOPY  AGE 60  ? CYSTOSCOPY W/ RETROGRADES Bilateral 02/19/2021  ? Procedure: CYSTOSCOPY WITH RETROGRADE PYELOGRAM;  Surgeon: Vanna Scotland, MD;  Location: [facility] ORS;  Service: Urology;  Laterality: Bilateral;  ? CYSTOSCOPY/URETEROSCOPY/HOLMIUM LASER/STENT PLACEMENT Right 02/19/2021  ? Procedure: CYSTOSCOPY/URETEROSCOPY/HOLMIUM LASER/STENT PLACEMENT;  Surgeon: Vanna Scotland, MD;  Location: [facility] ORS;  Service: Urology;  Laterality: Right;  ? ESOPHAGOGASTRODUODENOSCOPY (EGD) WITH PROPOFOL N/A 07/12/2019  ? Procedure:  ESOPHAGOGASTRODUODENOSCOPY (EGD) WITH PROPOFOL;  Surgeon: Christena Deem, MD;  Location: [facility] ENDOSCOPY;  Service: Endoscopy;  Laterality: N/A;  ? WISDOM TOOTH EXTRACTION    ? ? ?Home Medications:  ?Allergies as of 04/17/2022   ? ?   Reactions  ? Hydrocodone Anaphylaxis  ? Antacid Medicine [traumeel] Other (See Comments)  ? High doses of antacids were causing dizziness.  Not sure if she still has a stomach ulcer so her MD d/c'd these meds  ? ?  ? ?  ?Medication List  ?  ? ?  ? Accurate as of April 17, 2022 10:13 AM. If you have any questions, ask your nurse or doctor.  ?  ?  ? ?  ? ?STOP taking these medications   ? ?buPROPion 150 MG 24 hr tablet ?Commonly known as: WELLBUTRIN XL ?Stopped by: Vanna Scotland, MD ?  ?sertraline 50 MG tablet ?Commonly known as: ZOLOFT ?Stopped by: Vanna Scotland, MD ?  ?trimethoprim-polymyxin b ophthalmic solution ?Commonly known as: POLYTRIM ?Stopped by: Vanna Scotland, MD ?  ? ?  ? ? ?Allergies:  ?Allergies  ?Allergen Reactions  ? Hydrocodone Anaphylaxis  ? Antacid Medicine [Traumeel] Other (See Comments)  ?  High doses of antacids were causing dizziness.  Not sure if she still has a stomach ulcer so her MD d/c'd these meds  ? ? ?Family History: ?Family History  ?Problem Relation Age of Onset  ? Pancreatic cancer Maternal Grandfather   ? Crohn's disease Maternal Grandfather   ? Colon cancer Neg Hx   ? ? ?Social History:  reports that she has never smoked. She has never used smokeless tobacco. She reports that she does not currently use alcohol. She reports that she does not use drugs. ? ? ?Physical Exam: ?BP (!) 131/96   Pulse (!) 101   Ht 5\' 2"  (1.575 m)   Wt 218 lb (98.9 kg)   LMP 03/23/2022 (Approximate)   BMI 39.87 kg/m?   ?Constitutional:  Alert and oriented, No acute distress. ?HEENT: [place] AT, moist mucus membranes.  Trachea midline, no masses. ?Cardiovascular: No clubbing, cyanosis, or edema. ?Respiratory: Normal respiratory effort, no increased work of breathing. ?Skin:  No rashes, bruises or suspicious lesions. ?Neurologic: Grossly intact, no focal deficits, moving all 4 extremities. ?Psychiatric: Normal mood and affect. ? ?Laboratory Data: ?Lab Results  ?Component Value Date  ? CREATININE 0.65 08/15/2019  ? ?No results found for: HGBA1C ? ?Urinalysis  ?>30 RBCs, 3-10 WBCs, nitrite negative no bacteria ? ?Assessment & Plan:   ? ?History of kidney stones  ?- In light of history of stones and hematuria offered her CT renal stone study or KUB. She is interested in KUB.  ?- Prescribed Flomax  ?- KUB; prescribed  ?-If KUB non diagnostic, will arrange for stat CT scan ?-Warning symptoms reviewd ? ?2. Hematuria / right flank pain ?- As above  ?-Pending results of scan, we discussed various treatment options for urolithiasis including observation with or without medical expulsive therapy, shockwave lithotripsy (SWL), ureteroscopy and laser lithotripsy with stent placement, and percutaneous nephrolithotomy. ?  ?We discussed that management is based on stone size, location, density, patient co-morbidities, and patient preference.  ?  ?Stones <75mm in size have a >80% spontaneous passage rate. Data surrounding the use of tamsulosin for medical expulsive therapy is controversial, but meta analyses suggests it is most efficacious for distal stones between 5-36mm in size. Possible side effects include dizziness/lightheadedness, and retrograde ejaculation. ?  ?SWL has a lower stone free rate in a single procedure, but also a lower complication rate compared to ureteroscopy and avoids a stent and associated stent related symptoms. Possible complications include renal hematoma, steinstrasse, and need for additional treatment. We discussed the role of his increased skin to stone distance can lead to decreased efficacy with shockwave lithotripsy. ?  ?Ureteroscopy with laser lithotripsy and stent placement has a higher stone free rate than SWL in a single procedure, however increased complication rate  including possible infection, ureteral injury, bleeding, and stent related morbidity. Common stent related symptoms include dysuria, urgency/frequency, and flank pain. ?  ?She is leaning towards ESWL if the stone need treatment as she did poorly with the stent in the past.   ? ?Will call with results and arrange f/u surgery as indicated ? ? ?I,Kailey Littlejohn,acting as a scribe for 9m, MD.,have documented all relevant documentation on the behalf of Vanna Scotland, MD,as directed by  Vanna Scotland, MD while in the presence of Vanna Scotland, MD. ? ?I have reviewed the above documentation for accuracy and completeness, and I agree with the above.  ? ?Vanna Scotland, MD ? ? ?[facility] ?[address] ?[place], [place] ?[phone] ?

## 2022-04-17 ENCOUNTER — Ambulatory Visit: Payer: 59 | Admitting: Urology

## 2022-04-17 ENCOUNTER — Ambulatory Visit
Admission: RE | Admit: 2022-04-17 | Discharge: 2022-04-17 | Disposition: A | Discharge status: Home / Self Care | Payer: 59 | Source: Ambulatory Visit | Attending: Urology | Admitting: Urology

## 2022-04-17 ENCOUNTER — Encounter: Payer: Self-pay | Admitting: Urology

## 2022-04-17 ENCOUNTER — Other Ambulatory Visit: Payer: Self-pay

## 2022-04-17 VITALS — BP 131/96 | HR 101 | Ht 62.0 in | Wt 218.0 lb

## 2022-04-17 DIAGNOSIS — R310 Gross hematuria: Secondary | ICD-10-CM | POA: Insufficient documentation

## 2022-04-17 DIAGNOSIS — K5730 Diverticulosis of large intestine without perforation or abscess without bleeding: Secondary | ICD-10-CM | POA: Diagnosis not present

## 2022-04-17 DIAGNOSIS — R319 Hematuria, unspecified: Secondary | ICD-10-CM | POA: Diagnosis not present

## 2022-04-17 DIAGNOSIS — R1031 Right lower quadrant pain: Secondary | ICD-10-CM

## 2022-04-17 DIAGNOSIS — R109 Unspecified abdominal pain: Secondary | ICD-10-CM | POA: Diagnosis not present

## 2022-04-17 DIAGNOSIS — N200 Calculus of kidney: Secondary | ICD-10-CM | POA: Diagnosis not present

## 2022-04-17 DIAGNOSIS — Z87442 Personal history of urinary calculi: Secondary | ICD-10-CM

## 2022-04-17 DIAGNOSIS — N289 Disorder of kidney and ureter, unspecified: Secondary | ICD-10-CM | POA: Diagnosis not present

## 2022-04-17 LAB — URINALYSIS, COMPLETE
Bilirubin, UA: NEGATIVE
Glucose, UA: NEGATIVE
Ketones, UA: NEGATIVE
Nitrite, UA: NEGATIVE
Specific Gravity, UA: >1.03 — ABNORMAL HIGH (ref 1.005–1.030)
Urobilinogen, Ur: 0.2 mg/dL (ref 0.2–1.0)
pH, UA: 5.5 (ref 5.0–7.5)

## 2022-04-17 LAB — MICROSCOPIC EXAMINATION: RBC, Urine: >30 /hpf — AB (ref 0–2)

## 2022-04-17 MED ORDER — TAMSULOSIN HCL 0.4 MG PO CAPS
0.4000 mg | ORAL_CAPSULE | Freq: Every day | ORAL | 0 refills | Status: AC
  Filled 2022-04-17: qty 30, 30d supply, fill #0

## 2022-04-17 NOTE — Telephone Encounter (Signed)
Pt called back and will proceed with CT scan ?

## 2022-04-18 NOTE — Telephone Encounter (Addendum)
Spoke with patient, she would like to proceed with Lithotripsy.  ? ? ? ?----- Message from Hollice Espy, MD sent at 04/18/2022 10:49 AM EDT ----- ?Barbara Cole, ? ?I am so glad we got this CT.  Your stone is large, 11 mm and there is less than 1% chance you are going to pass this thing.  I would recommend treating the stone.  I think you would be a reasonable candidate for shockwave lithotripsy as discussed.  Alterative would be ureteroscopy like before.If you want to do shock wave, we can schedule you for a week from today.  Let me know what you would like to do and we will help coordinate.   ? ?Hollice Espy, MD ? ?

## 2022-04-22 ENCOUNTER — Ambulatory Visit: Payer: 59

## 2022-04-22 DIAGNOSIS — Z1329 Encounter for screening for other suspected endocrine disorder: Secondary | ICD-10-CM | POA: Diagnosis not present

## 2022-04-22 DIAGNOSIS — R5383 Other fatigue: Secondary | ICD-10-CM | POA: Diagnosis not present

## 2022-04-22 DIAGNOSIS — E559 Vitamin D deficiency, unspecified: Secondary | ICD-10-CM | POA: Diagnosis not present

## 2022-04-22 DIAGNOSIS — G4700 Insomnia, unspecified: Secondary | ICD-10-CM | POA: Diagnosis not present

## 2022-04-22 DIAGNOSIS — E60 Dietary zinc deficiency: Secondary | ICD-10-CM | POA: Diagnosis not present

## 2022-04-22 DIAGNOSIS — E610 Copper deficiency: Secondary | ICD-10-CM | POA: Diagnosis not present

## 2022-04-22 DIAGNOSIS — F419 Anxiety disorder, unspecified: Secondary | ICD-10-CM | POA: Diagnosis not present

## 2022-04-22 DIAGNOSIS — E538 Deficiency of other specified B group vitamins: Secondary | ICD-10-CM | POA: Diagnosis not present

## 2022-04-22 DIAGNOSIS — Z131 Encounter for screening for diabetes mellitus: Secondary | ICD-10-CM | POA: Diagnosis not present

## 2022-04-22 DIAGNOSIS — E612 Magnesium deficiency: Secondary | ICD-10-CM | POA: Diagnosis not present

## 2022-04-24 ENCOUNTER — Ambulatory Visit: Payer: 59 | Admitting: Urology

## 2022-04-26 DIAGNOSIS — K760 Fatty (change of) liver, not elsewhere classified: Secondary | ICD-10-CM | POA: Diagnosis not present

## 2022-05-06 ENCOUNTER — Other Ambulatory Visit: Payer: Self-pay

## 2022-05-29 ENCOUNTER — Encounter: Payer: Self-pay | Admitting: Urology

## 2022-06-04 ENCOUNTER — Encounter: Payer: Self-pay | Admitting: Urology

## 2022-06-04 ENCOUNTER — Telehealth (INDEPENDENT_AMBULATORY_CARE_PROVIDER_SITE_OTHER): Payer: 59 | Admitting: Urology

## 2022-06-04 DIAGNOSIS — N281 Cyst of kidney, acquired: Secondary | ICD-10-CM | POA: Diagnosis not present

## 2022-06-04 DIAGNOSIS — N2889 Other specified disorders of kidney and ureter: Secondary | ICD-10-CM

## 2022-06-04 DIAGNOSIS — N200 Calculus of kidney: Secondary | ICD-10-CM

## 2022-06-04 DIAGNOSIS — Z3A08 8 weeks gestation of pregnancy: Secondary | ICD-10-CM | POA: Diagnosis not present

## 2022-06-04 DIAGNOSIS — O99891 Other specified diseases and conditions complicating pregnancy: Secondary | ICD-10-CM

## 2022-06-04 NOTE — Progress Notes (Signed)
Virtual Visit via Video Note  I connected with Barbara Cole on  06/04/2022 at 11:45 AM EDT by a video enabled telemedicine application and verified that I am speaking with the correct person using two identifiers.  Location: Patient: work Provider: office   I discussed the limitations of evaluation and management by telemedicine and the availability of in person appointments. The patient expressed understanding and agreed to proceed.  History of Present Illness: Barbara Cole is a 31 y.o. [redacted] weeks pregnant female with a personal history of kidney stones, hematuria/flank pain who presents today for a follow-up via mychart video.   She was taken to the OR on 02/19/2019 for large right renal pelvic stone (1.5 cm).  Procedure was uncomplicated.    Her most recent imagine was a KUB and CT renal stone study. Her KUB visualized no underlying burden. Her CT visualized an 11 mm calculus in right renal pelvis. No evidence of ureteral calculi or hydroureteronephrosis.  We originally planned for shockwave lithotripsy in late May but since learning that she is pregnant, she would like to discuss alternative options today risk and benefits of each.  She reports that she will be [redacted] weeks pregnant on Sunday. She is going to proceed with this pregnancy.     She sees a Forensic psychologist which she sees for stone prevention that is apple cider vinegar based.   She has been having some intermittent mild right flank pain which comes and goes.  Its not terribly bothersome or severe.  Has not had any further gross hematuria since our last visit.  Observations/Objective: Well-appearing  Assessment and Plan:  Right renal pelvic stone  - Intermittently symptomatic but her pain is not severe  - Discussed with her that we will not be able to proceed with ESWL as it is contraindicative while she is pregnant.  - We discussed the risk of undergoing ureteroscopy in her 2nd trimester of pregnancy as a  consideration -We discussed risk and benefits of second trimester ureteroscopy as well as risk of radiation exposure, risk of anesthesia, risk of fetal compromise which is relatively low but not absent - We discussed the risk of conservative management given that the stone is likely only intermittently obstructive; this includes the risk of intermittent pain, intermittent hematuria as well as the risk of infection throughout the pregnancy.  Also risk of increased stone growth during pregnancy.   -Ultimately, after lengthy discussion today, she would like to proceed with conservative management let us know if she changes her mind or has any other complications including infections - KUB; scheduled February 2024   2.  Renal cyst -Originally scheduled for MRI in the fall however in light of this pregnancy, will defer this until after delivery February 2024  Follow Up Instructions: I discussed the assessment and treatment plan with the patient. The patient was provided an opportunity to ask questions and all were answered. The patient agreed with the plan and demonstrated an understanding of the instructions.   The patient was advised to call back or seek an in-person evaluation if the symptoms worsen or if the condition fails to improve as anticipated.  I provided 14 minutes of non-face-to-face time during this encounter.   I,Kailey Littlejohn,acting as a Neurosurgeon for Vanna Scotland, MD.,have documented all relevant documentation on the behalf of Vanna Scotland, MD,as directed by  Vanna Scotland, MD while in the presence of Vanna Scotland, MD.  I have reviewed the above documentation for accuracy and completeness, and I  agree with the above.   Vanna Scotland, MD

## 2022-06-04 NOTE — Progress Notes (Unsigned)
This service is provided via telemedicine   No vital signs collected/recorded due to the encounter was a telemedicine visit.     Patient consents to a telephone visit:  yes    Names of all persons participating in the telemedicine service and their role in the encounter:  Antoniette Peake O`Sullivann,RMA

## 2022-06-11 DIAGNOSIS — R109 Unspecified abdominal pain: Secondary | ICD-10-CM | POA: Diagnosis not present

## 2022-06-11 DIAGNOSIS — Z3200 Encounter for pregnancy test, result unknown: Secondary | ICD-10-CM | POA: Diagnosis not present

## 2022-06-11 LAB — OB RESULTS CONSOLE ABO/RH: RH Type: POSITIVE

## 2022-06-11 LAB — OB RESULTS CONSOLE ANTIBODY SCREEN: Antibody Screen: NEGATIVE

## 2022-06-18 DIAGNOSIS — Z3201 Encounter for pregnancy test, result positive: Secondary | ICD-10-CM | POA: Diagnosis not present

## 2022-07-04 DIAGNOSIS — O3680X9 Pregnancy with inconclusive fetal viability, other fetus: Secondary | ICD-10-CM | POA: Diagnosis not present

## 2022-07-04 DIAGNOSIS — Z3A10 10 weeks gestation of pregnancy: Secondary | ICD-10-CM | POA: Diagnosis not present

## 2022-07-04 DIAGNOSIS — Z3689 Encounter for other specified antenatal screening: Secondary | ICD-10-CM | POA: Diagnosis not present

## 2022-07-04 DIAGNOSIS — Z3481 Encounter for supervision of other normal pregnancy, first trimester: Secondary | ICD-10-CM | POA: Diagnosis not present

## 2022-07-04 LAB — OB RESULTS CONSOLE RPR: RPR: NONREACTIVE

## 2022-07-04 LAB — OB RESULTS CONSOLE RUBELLA ANTIBODY, IGM: Rubella: IMMUNE

## 2022-07-04 LAB — OB RESULTS CONSOLE HIV ANTIBODY (ROUTINE TESTING): HIV: NONREACTIVE

## 2022-07-04 LAB — OB RESULTS CONSOLE GC/CHLAMYDIA
Chlamydia: NEGATIVE
Neisseria Gonorrhea: NEGATIVE

## 2022-07-04 LAB — OB RESULTS CONSOLE HEPATITIS B SURFACE ANTIGEN: Hepatitis B Surface Ag: NEGATIVE

## 2022-07-29 DIAGNOSIS — Z3689 Encounter for other specified antenatal screening: Secondary | ICD-10-CM | POA: Diagnosis not present

## 2022-08-10 ENCOUNTER — Inpatient Hospital Stay (HOSPITAL_COMMUNITY)
Admission: AD | Admit: 2022-08-10 | Discharge: 2022-08-11 | Disposition: A | Discharge status: Home / Self Care | Payer: 59 | Attending: Obstetrics & Gynecology | Admitting: Obstetrics & Gynecology

## 2022-08-10 ENCOUNTER — Encounter (HOSPITAL_COMMUNITY): Payer: Self-pay

## 2022-08-10 DIAGNOSIS — Z6790 Unspecified blood type, Rh positive: Secondary | ICD-10-CM

## 2022-08-10 DIAGNOSIS — Z3A15 15 weeks gestation of pregnancy: Secondary | ICD-10-CM

## 2022-08-10 DIAGNOSIS — R1030 Lower abdominal pain, unspecified: Secondary | ICD-10-CM | POA: Insufficient documentation

## 2022-08-10 DIAGNOSIS — R110 Nausea: Secondary | ICD-10-CM | POA: Insufficient documentation

## 2022-08-10 DIAGNOSIS — O26892 Other specified pregnancy related conditions, second trimester: Secondary | ICD-10-CM | POA: Insufficient documentation

## 2022-08-10 DIAGNOSIS — O4692 Antepartum hemorrhage, unspecified, second trimester: Secondary | ICD-10-CM

## 2022-08-10 DIAGNOSIS — Z3A16 16 weeks gestation of pregnancy: Secondary | ICD-10-CM | POA: Insufficient documentation

## 2022-08-10 DIAGNOSIS — O209 Hemorrhage in early pregnancy, unspecified: Secondary | ICD-10-CM | POA: Insufficient documentation

## 2022-08-10 NOTE — MAU Note (Signed)
.  Barbara Cole is a 31 y.o. at 15.6 weeks here in MAU reporting: light pink spotting x 24 hours-period type cramping that has been intermittent. Pt reports the spotting is only after urination on the toilet paper. Reports severe nausea with vomiting before cramping and spotting began. Spoke with Dr.Mody and was instructed to come to MAU for evaluation.  Last intercourse:3 weeks ago Onset of complaint: 08/09/2022 Pain score: 3 Vitals:   08/10/22 2326  BP: 133/75  Pulse: 81  Resp: 17  Temp: 98.3 F (36.8 C)  SpO2: 99%     FHT:159 Lab orders placed from triage:  UA

## 2022-08-11 ENCOUNTER — Inpatient Hospital Stay (HOSPITAL_BASED_OUTPATIENT_CLINIC_OR_DEPARTMENT_OTHER): Payer: 59

## 2022-08-11 ENCOUNTER — Encounter (HOSPITAL_COMMUNITY): Payer: Self-pay | Admitting: Obstetrics & Gynecology

## 2022-08-11 DIAGNOSIS — R110 Nausea: Secondary | ICD-10-CM | POA: Diagnosis not present

## 2022-08-11 DIAGNOSIS — Z3A16 16 weeks gestation of pregnancy: Secondary | ICD-10-CM

## 2022-08-11 DIAGNOSIS — O26892 Other specified pregnancy related conditions, second trimester: Secondary | ICD-10-CM | POA: Diagnosis not present

## 2022-08-11 DIAGNOSIS — O4692 Antepartum hemorrhage, unspecified, second trimester: Secondary | ICD-10-CM | POA: Diagnosis not present

## 2022-08-11 DIAGNOSIS — Z3A15 15 weeks gestation of pregnancy: Secondary | ICD-10-CM

## 2022-08-11 DIAGNOSIS — R1030 Lower abdominal pain, unspecified: Secondary | ICD-10-CM | POA: Diagnosis not present

## 2022-08-11 DIAGNOSIS — O209 Hemorrhage in early pregnancy, unspecified: Secondary | ICD-10-CM | POA: Diagnosis not present

## 2022-08-11 LAB — URINALYSIS, ROUTINE W REFLEX MICROSCOPIC
Bilirubin Urine: NEGATIVE
Glucose, UA: NEGATIVE mg/dL
Ketones, ur: NEGATIVE mg/dL
Nitrite: NEGATIVE
Protein, ur: 30 mg/dL — AB
RBC / HPF: >50 RBC/hpf — ABNORMAL HIGH (ref 0–5)
Specific Gravity, Urine: 1.018 (ref 1.005–1.030)
pH: 5 (ref 5.0–8.0)

## 2022-08-11 LAB — CBC
HCT: 36.2 % (ref 36.0–46.0)
Hemoglobin: 12.2 g/dL (ref 12.0–15.0)
MCH: 30.2 pg (ref 26.0–34.0)
MCHC: 33.7 g/dL (ref 30.0–36.0)
MCV: 89.6 fL (ref 80.0–100.0)
Platelets: 260 10*3/uL (ref 150–400)
RBC: 4.04 MIL/uL (ref 3.87–5.11)
RDW: 12.4 % (ref 11.5–15.5)
WBC: 11.7 10*3/uL — ABNORMAL HIGH (ref 4.0–10.5)
nRBC: 0 % (ref 0.0–0.2)

## 2022-08-11 LAB — ABO/RH: ABO/RH(D): O POS

## 2022-08-11 NOTE — Discharge Instructions (Signed)

## 2022-08-11 NOTE — MAU Provider Note (Signed)
History     CSN: [id number]  Arrival date and time: 08/10/22 2244   Event Date/Time   First Provider Initiated Contact with Patient 08/10/22 2354      Chief Complaint  Patient presents with   Vaginal Bleeding   HPI Barbara Cole is a 31 y.o. G2P1001 at [redacted]w[redacted]d who presents to MAU with chief complaint of vaginal spotting today. She is seeing bright red bleeding when she wipes after voiding. Onset occurred after an episode of pain across the top of her abdomen and subsequent acute onset nausea. Patient states she did not strain vomit and was no dry heaving. She is remote from sexual intercourse. She had a similar episode of spotting about 8 weeks ago but that episode occurred shortly after sex.  Patient also reports lower abdominal cramping. Onset coinciding with onset of bleeding. Pain score is 3/10 and pain is "dull". She has not taken medication for this complaint. She declines pain medication in MAU.  Patient receives prenatal care with [facility].  OB History     Gravida  2   Para  1   Term  1   Preterm  0   AB  0   Living  1      SAB  0   IAB  0   Ectopic  0   Multiple  0   Live Births  1           Past Medical History:  Diagnosis Date   Anal fissure    Anxiety    Depression    Headache    MIGRAINES   History of kidney stones 07/2019   right kidney stone currently   Stomach ulcer     Past Surgical History:  Procedure Laterality Date   CHOLECYSTECTOMY N/A 08/25/2019   Procedure: LAPAROSCOPIC CHOLECYSTECTOMY;  Surgeon: Carolan Shiver, MD;  Location: [facility] ORS;  Service: General;  Laterality: N/A;   COLONOSCOPY  AGE 10   CYSTOSCOPY W/ RETROGRADES Bilateral 02/19/2021   Procedure: CYSTOSCOPY WITH RETROGRADE PYELOGRAM;  Surgeon: Vanna Scotland, MD;  Location: [facility] ORS;  Service: Urology;  Laterality: Bilateral;   CYSTOSCOPY/URETEROSCOPY/HOLMIUM LASER/STENT PLACEMENT Right 02/19/2021   Procedure: CYSTOSCOPY/URETEROSCOPY/HOLMIUM LASER/STENT  PLACEMENT;  Surgeon: Vanna Scotland, MD;  Location: [facility] ORS;  Service: Urology;  Laterality: Right;   ESOPHAGOGASTRODUODENOSCOPY (EGD) WITH PROPOFOL N/A 07/12/2019   Procedure: ESOPHAGOGASTRODUODENOSCOPY (EGD) WITH PROPOFOL;  Surgeon: Christena Deem, MD;  Location: [facility] ENDOSCOPY;  Service: Endoscopy;  Laterality: N/A;   WISDOM TOOTH EXTRACTION      Family History  Problem Relation Age of Onset   Diabetes Maternal Grandfather    Cancer Maternal Grandfather    Pancreatic cancer Maternal Grandfather    Crohn's disease Maternal Grandfather    Colon cancer Neg Hx     Social History   Tobacco Use   Smoking status: Never   Smokeless tobacco: Never  Vaping Use   Vaping Use: Never used  Substance Use Topics   Alcohol use: Not Currently   Drug use: Never    Allergies:  Allergies  Allergen Reactions   Hydrocodone Anaphylaxis   Antacid Medicine [Traumeel] Other (See Comments)    High doses of antacids were causing dizziness.  Not sure if she still has a stomach ulcer so her MD d/c'd these meds    Medications Prior to Admission  Medication Sig Dispense Refill Last Dose   docusate sodium (COLACE) 100 MG capsule Take 100 mg by mouth daily.   Past Week   Prenatal Vit-Fe Fumarate-FA (  PRENATAL VITAMIN PO) Take 1 tablet by mouth daily.   Past Week    Review of Systems  Gastrointestinal:  Positive for abdominal pain.  Genitourinary:  Positive for vaginal bleeding.  All other systems reviewed and are negative.  Physical Exam   Blood pressure 133/75, pulse 81, temperature 98.3 F (36.8 C), temperature source Oral, resp. rate 17, height 5\' 2"  (1.575 m), weight 100.2 kg, last menstrual period 03/23/2022, SpO2 99 %.  Physical Exam Vitals and nursing note reviewed. Exam conducted with a chaperone present.  Constitutional:      Appearance: Normal appearance.  Cardiovascular:     Rate and Rhythm: Normal rate and regular rhythm.     Pulses: Normal pulses.     Heart sounds: Normal  heart sounds.  Pulmonary:     Effort: Pulmonary effort is normal.     Breath sounds: Normal breath sounds.  Abdominal:     Tenderness: There is no abdominal tenderness.  Genitourinary:    Comments: Pelvic exam: External genitalia normal, vaginal walls pink and well rugated, cervix visually closed, no lesions noted. No discharge or bleeding observed   Skin:    Capillary Refill: Capillary refill takes less than 2 seconds.  Neurological:     Mental Status: She is alert and oriented to person, place, and time.  Psychiatric:        Mood and Affect: Mood normal.        Behavior: Behavior normal.        Judgment: Judgment normal.     MAU Course  Procedures  MDM Orders Placed This Encounter  Procedures   03/25/2022 MFM OB Limited   Urinalysis, Routine w reflex microscopic Urine, Clean Catch   CBC   ABO/Rh   Patient Vitals for the past 24 hrs:  BP Temp Temp src Pulse Resp SpO2 Height Weight  08/10/22 2326 133/75 98.3 F (36.8 C) Oral 81 17 99 % 5\' 2"  (1.575 m) 100.2 kg   Results for orders placed or performed during the hospital encounter of 08/10/22 (from the past 24 hour(s))  Urinalysis, Routine w reflex microscopic Urine, Clean Catch     Status: Abnormal   Collection Time: 08/10/22 12:45 PM  Result Value Ref Range   Color, Urine YELLOW YELLOW   APPearance HAZY (A) CLEAR   Specific Gravity, Urine 1.018 1.005 - 1.030   pH 5.0 5.0 - 8.0   Glucose, UA NEGATIVE NEGATIVE mg/dL   Hgb urine dipstick MODERATE (A) NEGATIVE   Bilirubin Urine NEGATIVE NEGATIVE   Ketones, ur NEGATIVE NEGATIVE mg/dL   Protein, ur 30 (A) NEGATIVE mg/dL   Nitrite NEGATIVE NEGATIVE   Leukocytes,Ua SMALL (A) NEGATIVE   RBC / HPF >50 (H) 0 - 5 RBC/hpf   WBC, UA 11-20 0 - 5 WBC/hpf   Bacteria, UA FEW (A) NONE SEEN   Squamous Epithelial / LPF 6-10 0 - 5   Mucus PRESENT    Ca Oxalate Crys, UA PRESENT   CBC     Status: Abnormal   Collection Time: 08/11/22 12:09 AM  Result Value Ref Range   WBC 11.7 (H) 4.0 -  10.5 K/uL   RBC 4.04 3.87 - 5.11 MIL/uL   Hemoglobin 12.2 12.0 - 15.0 g/dL   HCT 10/10/22 08/13/22 - 17.6 %   MCV 89.6 80.0 - 100.0 fL   MCH 30.2 26.0 - 34.0 pg   MCHC 33.7 30.0 - 36.0 g/dL   RDW 16.0 73.7 - 10.6 %   Platelets 260 150 - 400  K/uL   nRBC 0.0 0.0 - 0.2 %  ABO/Rh     Status: None   Collection Time: 08/11/22 12:09 AM  Result Value Ref Range   ABO/RH(D)      O POS Performed at [facility], [address] [place], [place] [address]     Assessment and Plan  --31 y.o. G2P1001 at [redacted]w[redacted]d --FHT 159 by Doppler --Vaginal spotting --Anterior placenta, no acute findings on OB MFM [place] --Blood type O POS --Discharge home in stable condition  F/U: --Patient has OB appointment Wednesday 08/16  Calvert Cantor, MSA, MSN, CNM 08/11/2022, 1:03 AM

## 2022-08-14 ENCOUNTER — Other Ambulatory Visit: Payer: Self-pay

## 2022-08-14 DIAGNOSIS — Z3A16 16 weeks gestation of pregnancy: Secondary | ICD-10-CM | POA: Diagnosis not present

## 2022-08-14 DIAGNOSIS — Z3492 Encounter for supervision of normal pregnancy, unspecified, second trimester: Secondary | ICD-10-CM | POA: Diagnosis not present

## 2022-08-14 DIAGNOSIS — Z361 Encounter for antenatal screening for raised alphafetoprotein level: Secondary | ICD-10-CM | POA: Diagnosis not present

## 2022-08-14 DIAGNOSIS — Z3402 Encounter for supervision of normal first pregnancy, second trimester: Secondary | ICD-10-CM | POA: Diagnosis not present

## 2022-08-14 MED ORDER — SERTRALINE HCL 50 MG PO TABS
ORAL_TABLET | ORAL | 2 refills | Status: DC
  Filled 2022-08-14: qty 90, 90d supply, fill #0

## 2022-08-14 MED ORDER — BUSPIRONE HCL 5 MG PO TABS
ORAL_TABLET | ORAL | 2 refills | Status: DC
  Filled 2022-08-14: qty 60, 30d supply, fill #0

## 2022-08-21 DIAGNOSIS — F4323 Adjustment disorder with mixed anxiety and depressed mood: Secondary | ICD-10-CM | POA: Diagnosis not present

## 2022-08-23 DIAGNOSIS — Z3A17 17 weeks gestation of pregnancy: Secondary | ICD-10-CM | POA: Diagnosis not present

## 2022-08-23 DIAGNOSIS — Z3402 Encounter for supervision of normal first pregnancy, second trimester: Secondary | ICD-10-CM | POA: Diagnosis not present

## 2022-08-23 DIAGNOSIS — Z118 Encounter for screening for other infectious and parasitic diseases: Secondary | ICD-10-CM | POA: Diagnosis not present

## 2022-09-04 DIAGNOSIS — Z3A19 19 weeks gestation of pregnancy: Secondary | ICD-10-CM | POA: Diagnosis not present

## 2022-09-04 DIAGNOSIS — Z363 Encounter for antenatal screening for malformations: Secondary | ICD-10-CM | POA: Diagnosis not present

## 2022-09-04 DIAGNOSIS — R69 Illness, unspecified: Secondary | ICD-10-CM | POA: Diagnosis not present

## 2022-09-18 DIAGNOSIS — R69 Illness, unspecified: Secondary | ICD-10-CM | POA: Diagnosis not present

## 2022-09-18 DIAGNOSIS — Z362 Encounter for other antenatal screening follow-up: Secondary | ICD-10-CM | POA: Diagnosis not present

## 2022-10-09 DIAGNOSIS — R69 Illness, unspecified: Secondary | ICD-10-CM | POA: Diagnosis not present

## 2022-10-30 DIAGNOSIS — R69 Illness, unspecified: Secondary | ICD-10-CM | POA: Diagnosis not present

## 2022-11-11 DIAGNOSIS — Z3A29 29 weeks gestation of pregnancy: Secondary | ICD-10-CM | POA: Diagnosis not present

## 2022-11-11 DIAGNOSIS — Z3689 Encounter for other specified antenatal screening: Secondary | ICD-10-CM | POA: Diagnosis not present

## 2022-12-02 DIAGNOSIS — Z3A31 31 weeks gestation of pregnancy: Secondary | ICD-10-CM | POA: Diagnosis not present

## 2022-12-02 DIAGNOSIS — O24415 Gestational diabetes mellitus in pregnancy, controlled by oral hypoglycemic drugs: Secondary | ICD-10-CM | POA: Diagnosis not present

## 2022-12-02 DIAGNOSIS — Z3483 Encounter for supervision of other normal pregnancy, third trimester: Secondary | ICD-10-CM | POA: Diagnosis not present

## 2022-12-03 DIAGNOSIS — R69 Illness, unspecified: Secondary | ICD-10-CM | POA: Diagnosis not present

## 2022-12-05 DIAGNOSIS — R109 Unspecified abdominal pain: Secondary | ICD-10-CM | POA: Diagnosis not present

## 2022-12-16 DIAGNOSIS — Z3A34 34 weeks gestation of pregnancy: Secondary | ICD-10-CM | POA: Diagnosis not present

## 2022-12-16 DIAGNOSIS — O24415 Gestational diabetes mellitus in pregnancy, controlled by oral hypoglycemic drugs: Secondary | ICD-10-CM | POA: Diagnosis not present

## 2022-12-25 DIAGNOSIS — Z3A35 35 weeks gestation of pregnancy: Secondary | ICD-10-CM | POA: Diagnosis not present

## 2022-12-25 DIAGNOSIS — Z3493 Encounter for supervision of normal pregnancy, unspecified, third trimester: Secondary | ICD-10-CM | POA: Diagnosis not present

## 2022-12-25 LAB — OB RESULTS CONSOLE GBS
GBS: NEGATIVE
GBS: POSITIVE

## 2022-12-26 DIAGNOSIS — R69 Illness, unspecified: Secondary | ICD-10-CM | POA: Diagnosis not present

## 2022-12-30 NOTE — L&D Delivery Note (Signed)
Delivery Note At 2:14 PM a viable and healthy female was delivered via Vaginal, Spontaneous (Presentation: Right Occiput Anterior).  APGAR: 9, 9; weight  pending.   Placenta status: Spontaneous, Intact.  Cord: 3 vessels with the following complications: None.  Cord pH: na  Anesthesia: Epidural Episiotomy: na  Lacerations:  na Suture Repair:  na Est. Blood Loss (mL):  69  Mom to postpartum.  Baby to Couplet care / Skin to Skin.  [name] J 01/14/2023, 2:23 PM

## 2022-12-31 DIAGNOSIS — Z3A36 36 weeks gestation of pregnancy: Secondary | ICD-10-CM | POA: Diagnosis not present

## 2022-12-31 DIAGNOSIS — O24415 Gestational diabetes mellitus in pregnancy, controlled by oral hypoglycemic drugs: Secondary | ICD-10-CM | POA: Diagnosis not present

## 2023-01-07 DIAGNOSIS — O24415 Gestational diabetes mellitus in pregnancy, controlled by oral hypoglycemic drugs: Secondary | ICD-10-CM | POA: Diagnosis not present

## 2023-01-07 DIAGNOSIS — Z3A37 37 weeks gestation of pregnancy: Secondary | ICD-10-CM | POA: Diagnosis not present

## 2023-01-10 DIAGNOSIS — Z3A37 37 weeks gestation of pregnancy: Secondary | ICD-10-CM | POA: Diagnosis not present

## 2023-01-10 DIAGNOSIS — O368199 Decreased fetal movements, unspecified trimester, other fetus: Secondary | ICD-10-CM | POA: Diagnosis not present

## 2023-01-13 ENCOUNTER — Other Ambulatory Visit: Payer: Self-pay | Admitting: Obstetrics and Gynecology

## 2023-01-14 ENCOUNTER — Inpatient Hospital Stay (HOSPITAL_COMMUNITY)
Admission: RE | Admit: 2023-01-14 | Discharge: 2023-01-15 | DRG: 807 | Disposition: A | Discharge status: Home / Self Care | Payer: 59 | Attending: Obstetrics and Gynecology | Admitting: Obstetrics and Gynecology

## 2023-01-14 ENCOUNTER — Encounter (HOSPITAL_COMMUNITY): Payer: Self-pay | Admitting: Obstetrics and Gynecology

## 2023-01-14 ENCOUNTER — Inpatient Hospital Stay (HOSPITAL_COMMUNITY): Payer: 59

## 2023-01-14 ENCOUNTER — Inpatient Hospital Stay (HOSPITAL_COMMUNITY): Payer: 59 | Admitting: Anesthesiology

## 2023-01-14 ENCOUNTER — Other Ambulatory Visit: Payer: Self-pay

## 2023-01-14 DIAGNOSIS — D649 Anemia, unspecified: Secondary | ICD-10-CM | POA: Diagnosis not present

## 2023-01-14 DIAGNOSIS — Z3490 Encounter for supervision of normal pregnancy, unspecified, unspecified trimester: Principal | ICD-10-CM | POA: Diagnosis present

## 2023-01-14 DIAGNOSIS — O99214 Obesity complicating childbirth: Secondary | ICD-10-CM | POA: Diagnosis present

## 2023-01-14 DIAGNOSIS — O99344 Other mental disorders complicating childbirth: Secondary | ICD-10-CM | POA: Diagnosis present

## 2023-01-14 DIAGNOSIS — Z87442 Personal history of urinary calculi: Secondary | ICD-10-CM

## 2023-01-14 DIAGNOSIS — O99824 Streptococcus B carrier state complicating childbirth: Secondary | ICD-10-CM | POA: Diagnosis not present

## 2023-01-14 DIAGNOSIS — Z8711 Personal history of peptic ulcer disease: Secondary | ICD-10-CM | POA: Diagnosis not present

## 2023-01-14 DIAGNOSIS — Z3A38 38 weeks gestation of pregnancy: Secondary | ICD-10-CM

## 2023-01-14 DIAGNOSIS — F419 Anxiety disorder, unspecified: Secondary | ICD-10-CM | POA: Diagnosis present

## 2023-01-14 DIAGNOSIS — O9902 Anemia complicating childbirth: Secondary | ICD-10-CM | POA: Diagnosis not present

## 2023-01-14 DIAGNOSIS — O24425 Gestational diabetes mellitus in childbirth, controlled by oral hypoglycemic drugs: Secondary | ICD-10-CM | POA: Diagnosis not present

## 2023-01-14 DIAGNOSIS — R69 Illness, unspecified: Secondary | ICD-10-CM | POA: Diagnosis not present

## 2023-01-14 LAB — CBC
HCT: 34.5 % — ABNORMAL LOW (ref 36.0–46.0)
Hemoglobin: 11.7 g/dL — ABNORMAL LOW (ref 12.0–15.0)
MCH: 30.4 pg (ref 26.0–34.0)
MCHC: 33.9 g/dL (ref 30.0–36.0)
MCV: 89.6 fL (ref 80.0–100.0)
Platelets: 317 10*3/uL (ref 150–400)
RBC: 3.85 MIL/uL — ABNORMAL LOW (ref 3.87–5.11)
RDW: 13.2 % (ref 11.5–15.5)
WBC: 9.3 10*3/uL (ref 4.0–10.5)
nRBC: 0 % (ref 0.0–0.2)

## 2023-01-14 LAB — RPR: RPR Ser Ql: NONREACTIVE

## 2023-01-14 LAB — TYPE AND SCREEN
ABO/RH(D): O POS
Antibody Screen: NEGATIVE

## 2023-01-14 LAB — GLUCOSE, CAPILLARY
Glucose-Capillary: 110 mg/dL — ABNORMAL HIGH (ref 70–99)
Glucose-Capillary: 77 mg/dL (ref 70–99)

## 2023-01-14 MED ORDER — WITCH HAZEL-GLYCERIN EX PADS: 1.0000 | MEDICATED_PAD | CUTANEOUS | Status: DC | PRN

## 2023-01-14 MED ORDER — DIPHENHYDRAMINE HCL 25 MG PO CAPS: 25.0000 mg | ORAL_CAPSULE | Freq: Four times a day (QID) | ORAL | Status: DC | PRN

## 2023-01-14 MED ORDER — TETANUS-DIPHTH-ACELL PERTUSSIS 5-2.5-18.5 LF-MCG/0.5 IM SUSY: 0.5000 mL | PREFILLED_SYRINGE | Freq: Once | INTRAMUSCULAR | Status: DC

## 2023-01-14 MED ORDER — METHYLERGONOVINE MALEATE 0.2 MG PO TABS: 0.2000 mg | ORAL_TABLET | ORAL | Status: DC | PRN

## 2023-01-14 MED ORDER — OXYTOCIN BOLUS FROM INFUSION
333.0000 mL | Freq: Once | INTRAVENOUS | Status: AC
  Administered 2023-01-14: 333 mL via INTRAVENOUS

## 2023-01-14 MED ORDER — ONDANSETRON HCL 4 MG PO TABS: 4.0000 mg | ORAL_TABLET | ORAL | Status: DC | PRN

## 2023-01-14 MED ORDER — LIDOCAINE HCL (PF) 1 % IJ SOLN
INTRAMUSCULAR | Status: DC | PRN
  Administered 2023-01-14: 2 mL via EPIDURAL
  Administered 2023-01-14: 10 mL via EPIDURAL

## 2023-01-14 MED ORDER — BUSPIRONE HCL 5 MG PO TABS: 5.0000 mg | ORAL_TABLET | Freq: Three times a day (TID) | ORAL | Status: DC | PRN

## 2023-01-14 MED ORDER — LACTATED RINGERS IV SOLN: 500.0000 mL | Freq: Once | INTRAVENOUS | Status: DC

## 2023-01-14 MED ORDER — FENTANYL-BUPIVACAINE-NACL 0.5-0.125-0.9 MG/250ML-% EP SOLN
12.0000 mL/h | EPIDURAL | Status: DC | PRN
  Filled 2023-01-14: qty 250

## 2023-01-14 MED ORDER — DIPHENHYDRAMINE HCL 50 MG/ML IJ SOLN: 12.5000 mg | INTRAMUSCULAR | Status: DC | PRN

## 2023-01-14 MED ORDER — DIBUCAINE (PERIANAL) 1 % EX OINT: 1.0000 | TOPICAL_OINTMENT | CUTANEOUS | Status: DC | PRN

## 2023-01-14 MED ORDER — OXYCODONE-ACETAMINOPHEN 5-325 MG PO TABS: 2.0000 | ORAL_TABLET | ORAL | Status: DC | PRN

## 2023-01-14 MED ORDER — LACTATED RINGERS IV SOLN: 500.0000 mL | INTRAVENOUS | Status: DC | PRN

## 2023-01-14 MED ORDER — BENZOCAINE-MENTHOL 20-0.5 % EX AERO: 1.0000 | INHALATION_SPRAY | CUTANEOUS | Status: DC | PRN

## 2023-01-14 MED ORDER — COCONUT OIL OIL: 1.0000 | TOPICAL_OIL | Status: DC | PRN

## 2023-01-14 MED ORDER — ZOLPIDEM TARTRATE 5 MG PO TABS: 5.0000 mg | ORAL_TABLET | Freq: Every evening | ORAL | Status: DC | PRN

## 2023-01-14 MED ORDER — ACETAMINOPHEN 325 MG PO TABS: 650.0000 mg | ORAL_TABLET | ORAL | Status: DC | PRN

## 2023-01-14 MED ORDER — TERBUTALINE SULFATE 1 MG/ML IJ SOLN: 0.2500 mg | Freq: Once | INTRAMUSCULAR | Status: DC | PRN

## 2023-01-14 MED ORDER — OXYCODONE-ACETAMINOPHEN 5-325 MG PO TABS: 1.0000 | ORAL_TABLET | ORAL | Status: DC | PRN

## 2023-01-14 MED ORDER — EPHEDRINE 5 MG/ML INJ: 10.0000 mg | INTRAVENOUS | Status: DC | PRN

## 2023-01-14 MED ORDER — LIDOCAINE HCL (PF) 1 % IJ SOLN: 30.0000 mL | INTRAMUSCULAR | Status: DC | PRN

## 2023-01-14 MED ORDER — PHENYLEPHRINE 80 MCG/ML (10ML) SYRINGE FOR IV PUSH (FOR BLOOD PRESSURE SUPPORT): 80.0000 ug | PREFILLED_SYRINGE | INTRAVENOUS | Status: DC | PRN

## 2023-01-14 MED ORDER — SENNOSIDES-DOCUSATE SODIUM 8.6-50 MG PO TABS
2.0000 | ORAL_TABLET | Freq: Every day | ORAL | Status: DC
  Administered 2023-01-15: 2 via ORAL
  Filled 2023-01-14: qty 2

## 2023-01-14 MED ORDER — ONDANSETRON HCL 4 MG/2ML IJ SOLN: 4.0000 mg | INTRAMUSCULAR | Status: DC | PRN

## 2023-01-14 MED ORDER — PRENATAL MULTIVITAMIN CH
1.0000 | ORAL_TABLET | Freq: Every day | ORAL | Status: DC
  Administered 2023-01-15: 1 via ORAL
  Filled 2023-01-14: qty 1

## 2023-01-14 MED ORDER — LACTATED RINGERS IV SOLN: INTRAVENOUS | Status: DC

## 2023-01-14 MED ORDER — SOD CITRATE-CITRIC ACID 500-334 MG/5ML PO SOLN: 30.0000 mL | ORAL | Status: DC | PRN

## 2023-01-14 MED ORDER — SIMETHICONE 80 MG PO CHEW: 80.0000 mg | CHEWABLE_TABLET | ORAL | Status: DC | PRN

## 2023-01-14 MED ORDER — FENTANYL-BUPIVACAINE-NACL 0.5-0.125-0.9 MG/250ML-% EP SOLN
EPIDURAL | Status: DC | PRN
  Administered 2023-01-14: 12 mL/h via EPIDURAL

## 2023-01-14 MED ORDER — OXYTOCIN-SODIUM CHLORIDE 30-0.9 UT/500ML-% IV SOLN: 2.5000 [IU]/h | INTRAVENOUS | Status: DC

## 2023-01-14 MED ORDER — OXYTOCIN-SODIUM CHLORIDE 30-0.9 UT/500ML-% IV SOLN
1.0000 m[IU]/min | INTRAVENOUS | Status: DC
  Administered 2023-01-14: 2 m[IU]/min via INTRAVENOUS
  Filled 2023-01-14: qty 500

## 2023-01-14 MED ORDER — ONDANSETRON HCL 4 MG/2ML IJ SOLN: 4.0000 mg | Freq: Four times a day (QID) | INTRAMUSCULAR | Status: DC | PRN

## 2023-01-14 MED ORDER — IBUPROFEN 600 MG PO TABS
600.0000 mg | ORAL_TABLET | Freq: Four times a day (QID) | ORAL | Status: DC
  Administered 2023-01-14 – 2023-01-15 (×4): 600 mg via ORAL
  Filled 2023-01-14 (×4): qty 1

## 2023-01-14 MED ORDER — SERTRALINE HCL 50 MG PO TABS
50.0000 mg | ORAL_TABLET | Freq: Every day | ORAL | Status: DC
  Filled 2023-01-14: qty 1

## 2023-01-14 MED ORDER — METHYLERGONOVINE MALEATE 0.2 MG/ML IJ SOLN: 0.2000 mg | INTRAMUSCULAR | Status: DC | PRN

## 2023-01-14 NOTE — Anesthesia Preprocedure Evaluation (Signed)
Anesthesia Evaluation  Patient identified by MRN, date of birth, ID band Patient awake    Reviewed: Allergy & Precautions, Patient's Chart, lab work & pertinent test results  Airway Mallampati: II  TM Distance: >3 FB Neck ROM: Full    Dental no notable dental hx.    Pulmonary neg pulmonary ROS   Pulmonary exam normal breath sounds clear to auscultation       Cardiovascular negative cardio ROS Normal cardiovascular exam Rhythm:Regular Rate:Normal     Neuro/Psych  Headaches PSYCHIATRIC DISORDERS Anxiety Depression       GI/Hepatic Neg liver ROS, PUD,,,  Endo/Other  Obesity BMI 39  Renal/GU negative Renal ROS  negative genitourinary   Musculoskeletal negative musculoskeletal ROS (+)    Abdominal  (+) + obese  Peds negative pediatric ROS (+)  Hematology  (+) Blood dyscrasia, anemia Hb 11.7, plt 317   Anesthesia Other Findings   Reproductive/Obstetrics (+) Pregnancy                             Anesthesia Physical Anesthesia Plan  ASA: 2  Anesthesia Plan: Epidural   Post-op Pain Management:    Induction:   PONV Risk Score and Plan: 2  Airway Management Planned: Natural Airway  Additional Equipment: None  Intra-op Plan:   Post-operative Plan:   Informed Consent: I have reviewed the patients History and Physical, chart, labs and discussed the procedure including the risks, benefits and alternatives for the proposed anesthesia with the patient or authorized representative who has indicated his/her understanding and acceptance.       Plan Discussed with:   Anesthesia Plan Comments:        Anesthesia Quick Evaluation

## 2023-01-14 NOTE — Anesthesia Procedure Notes (Addendum)
Epidural Patient location during procedure: OB Start time: 01/14/2023 9:50 AM End time: 01/14/2023 9:57 AM  Staffing Anesthesiologist: Pervis Hocking, DO Performed: anesthesiologist   Preanesthetic Checklist Completed: patient identified, IV checked, risks and benefits discussed, monitors and equipment checked, pre-op evaluation and timeout performed  Epidural Patient position: sitting Prep: DuraPrep and site prepped and draped Patient monitoring: continuous pulse ox, blood pressure, heart rate and cardiac monitor Approach: midline Location: L3-L4 Injection technique: LOR air  Needle:  Needle type: Tuohy  Needle gauge: 17 G Needle length: 9 cm Needle insertion depth: 6.5 cm Catheter type: closed end flexible Catheter size: 19 Gauge Catheter at skin depth: 12 cm Test dose: negative  Assessment Sensory level: T8 Events: blood not aspirated, no cerebrospinal fluid, injection not painful, no injection resistance, no paresthesia and negative IV test  Additional Notes Patient identified. Risks/Benefits/Options discussed with patient including but not limited to bleeding, infection, nerve damage, paralysis, failed block, incomplete pain control, headache, blood pressure changes, nausea, vomiting, reactions to medication both or allergic, itching and postpartum back pain. Confirmed with bedside nurse the patient's most recent platelet count. Confirmed with patient that they are not currently taking any anticoagulation, have any bleeding history or any family history of bleeding disorders. Patient expressed understanding and wished to proceed. All questions were answered. Sterile technique was used throughout the entire procedure. Please see nursing notes for vital signs. Test dose was given through epidural catheter and negative prior to continuing to dose epidural or start infusion. Warning signs of high block given to the patient including shortness of breath, tingling/numbness in  hands, complete motor block, or any concerning symptoms with instructions to call for help. Patient was given instructions on fall risk and not to get out of bed. All questions and concerns addressed with instructions to call with any issues or inadequate analgesia.  Reason for block:procedure for pain

## 2023-01-14 NOTE — H&P (Signed)
Barbara Cole is a 32 y.o. female presenting for poorly controlled A2DM for IOL. OB History     Gravida  2   Para  1   Term  1   Preterm  0   AB  0   Living  1      SAB  0   IAB  0   Ectopic  0   Multiple  0   Live Births  1          Past Medical History:  Diagnosis Date   Anal fissure    Anxiety    Depression    Headache    MIGRAINES   History of kidney stones 07/2019   right kidney stone currently   Stomach ulcer    Past Surgical History:  Procedure Laterality Date   CHOLECYSTECTOMY N/A 08/25/2019   Procedure: LAPAROSCOPIC CHOLECYSTECTOMY;  Surgeon: Herbert Pun, MD;  Location: [facility] ORS;  Service: General;  Laterality: N/A;   COLONOSCOPY  AGE 37   CYSTOSCOPY W/ RETROGRADES Bilateral 02/19/2021   Procedure: CYSTOSCOPY WITH RETROGRADE PYELOGRAM;  Surgeon: Hollice Espy, MD;  Location: [facility] ORS;  Service: Urology;  Laterality: Bilateral;   CYSTOSCOPY/URETEROSCOPY/HOLMIUM LASER/STENT PLACEMENT Right 02/19/2021   Procedure: CYSTOSCOPY/URETEROSCOPY/HOLMIUM LASER/STENT PLACEMENT;  Surgeon: Hollice Espy, MD;  Location: [facility] ORS;  Service: Urology;  Laterality: Right;   ESOPHAGOGASTRODUODENOSCOPY (EGD) WITH PROPOFOL N/A 07/12/2019   Procedure: ESOPHAGOGASTRODUODENOSCOPY (EGD) WITH PROPOFOL;  Surgeon: Lollie Sails, MD;  Location: [facility];  Service: Endoscopy;  Laterality: N/A;   WISDOM TOOTH EXTRACTION     Family History: family history includes Cancer in her maternal grandfather; Crohn's disease in her maternal grandfather; Diabetes in her maternal grandfather; Pancreatic cancer in her maternal grandfather. Social History:  reports that she has never smoked. She has never used smokeless tobacco. She reports that she does not currently use alcohol. She reports that she does not use drugs.     Maternal Diabetes: Yes:  Diabetes Type:  Insulin/Medication controlled Genetic Screening: Normal Maternal Ultrasounds/Referrals: Normal Fetal  Ultrasounds or other Referrals:  None Maternal Substance Abuse:  No Significant Maternal Medications:  Meds include: Zoloft Significant Maternal Lab Results:  Group B Strep negative Number of Prenatal Visits:greater than 3 verified prenatal visits Other Comments:  None  Review of Systems  Constitutional: Negative.   All other systems reviewed and are negative.  Maternal Medical History:  Reason for admission: Contractions.   Contractions: Onset was 1-2 hours ago.   Frequency: rare.   Perceived severity is mild.   Fetal activity: Perceived fetal activity is normal.   Last perceived fetal movement was within the past hour.   Prenatal complications: no prenatal complications Prenatal Complications - Diabetes: gestational. Diabetes is managed by oral agent (monotherapy).       Height 5\' 2"  (1.575 m), weight 97.2 kg, last menstrual period 03/23/2022. Maternal Exam:  Uterine Assessment: Contraction strength is mild.  Contraction frequency is rare.  Abdomen: Patient reports no abdominal tenderness. Fetal presentation: vertex Introitus: Normal vulva. Normal vagina.  Ferning test: not done.  Amniotic fluid character: not assessed. Cervix: Cervix evaluated by digital exam.     Physical Exam Constitutional:      Appearance: Normal appearance.  HENT:     Head: Normocephalic and atraumatic.  Cardiovascular:     Rate and Rhythm: Normal rate and regular rhythm.  Pulmonary:     Effort: Pulmonary effort is normal.     Breath sounds: Normal breath sounds.  Abdominal:     General:  Bowel sounds are normal.     Palpations: Abdomen is soft.  Genitourinary:    General: Normal vulva.  Musculoskeletal:        General: Normal range of motion.     Cervical back: Normal range of motion and neck supple.  Skin:    General: Skin is warm and dry.  Neurological:     General: No focal deficit present.     Mental Status: She is alert and oriented to person, place, and time.  Psychiatric:         Mood and Affect: Mood normal.        Behavior: Behavior normal.     Prenatal labs: ABO, Rh: --/--/O POS Performed at [facility], [place] [address], [place], [place] [address]  [phone] Antibody: Negative (06/13 0000) Rubella: Immune (07/06 0000) RPR: Nonreactive (07/06 0000)  HBsAg: Negative (07/06 0000)  HIV: Non-reactive (07/06 0000)  GBS: Positive, Negative/-- (12/27 0000)   Assessment/Plan: 38+ week IUP  Poorly controlled DM Anxiety on Zoloft Barbara Cole 01/14/2023, 6:52 AM

## 2023-01-14 NOTE — Progress Notes (Signed)
Barbara Cole is a 32 y.o. G2P1001 at [redacted]w[redacted]d by LMP admitted for induction of labor due to Gestational diabetes- with borderline control on metformin.  Subjective: Getting crampy  Objective: BP 136/86   Pulse 96   Temp 98.4 F (36.9 C) (Oral)   Resp 18   Ht 5\' 2"  (1.575 m)   Wt 97.2 kg   LMP 03/23/2022 (Approximate)   SpO2 100%   BMI 39.18 kg/m  No intake/output data recorded. No intake/output data recorded.  FHT:  FHR: 145 bpm, variability: moderate,  accelerations:  Present,  decelerations:  Absent UC:   irregular, every 4 minutes SVE:   Dilation: 3 Effacement (%): 70 Station: -1 Exam by:: Dr. Ronita Cole AROM- clear  Labs: Lab Results  Component Value Date   WBC 9.3 01/14/2023   HGB 11.7 (L) 01/14/2023   HCT 34.5 (L) 01/14/2023   MCV 89.6 01/14/2023   PLT 317 01/14/2023    Assessment / Plan: A2DM for IOL  Labor: Progressing normally Preeclampsia:  no signs or symptoms of toxicity Fetal Wellbeing:  Category I Pain Control:  Labor support without medications I/D:  n/a Anticipated MOD:  NSVD  Barbara Kim, MD 01/14/2023, 10:05 AM

## 2023-01-14 NOTE — Lactation Note (Addendum)
This note was copied from a baby's chart. Lactation Consultation Note  Patient Name: Barbara Cole Date: 01/14/2023 Reason for consult: Initial assessment;Early term 37-38.6wks Age:34 hours Mom has decided to pump and bottle feed. Mom is formula feeding until her milk comes in. Gave mom spoon to hand express and collect colostrum and spoon feed. Mom shown how to use DEBP & how to disassemble, clean, & reassemble parts. Mom knows to pump every 3 hrs. Mom encouraged to feed baby 8-12 times/24 hours and with feeding cues.  Hand expression taught w/thick dot of colostrum noted. Mom stated she just isn't one to BF. LC supported mom's decision to pump and bottle feed. Mom has hands free DEBP but one isn't working. Mom has ordered a new one. Newborn feeding habits, STS, I&o reviewed. Encouraged to call for questions or concerns.  Maternal Data Has patient been taught Hand Expression?: Yes Does the patient have breastfeeding experience prior to this delivery?: No  Feeding    LATCH Score       Type of Nipple: Everted at rest and after stimulation  Comfort (Breast/Nipple): Soft / non-tender         Lactation Tools Discussed/Used Tools: Pump Breast pump type: Double-Electric Breast Pump Pump Education: Setup, frequency, and cleaning;Milk Storage Reason for Pumping: mom wants to pump and bottle feed. Pumping frequency: q3hr  Interventions Interventions: DEBP;LC Services brochure;Hand express;Breast massage;Breast compression;Breast feeding basics reviewed  Discharge    Consult Status Consult Status: Follow-up Date: 01/15/22 (f/u on day of discharge) Follow-up type: In-patient    Theodoro Kalata 01/14/2023, 10:50 PM

## 2023-01-15 ENCOUNTER — Other Ambulatory Visit: Payer: Self-pay

## 2023-01-15 DIAGNOSIS — Z3483 Encounter for supervision of other normal pregnancy, third trimester: Secondary | ICD-10-CM | POA: Diagnosis not present

## 2023-01-15 DIAGNOSIS — Z3482 Encounter for supervision of other normal pregnancy, second trimester: Secondary | ICD-10-CM | POA: Diagnosis not present

## 2023-01-15 LAB — CBC
HCT: 30.6 % — ABNORMAL LOW (ref 36.0–46.0)
Hemoglobin: 10.4 g/dL — ABNORMAL LOW (ref 12.0–15.0)
MCH: 30.5 pg (ref 26.0–34.0)
MCHC: 34 g/dL (ref 30.0–36.0)
MCV: 89.7 fL (ref 80.0–100.0)
Platelets: 251 10*3/uL (ref 150–400)
RBC: 3.41 MIL/uL — ABNORMAL LOW (ref 3.87–5.11)
RDW: 13.2 % (ref 11.5–15.5)
WBC: 11 10*3/uL — ABNORMAL HIGH (ref 4.0–10.5)
nRBC: 0 % (ref 0.0–0.2)

## 2023-01-15 MED ORDER — BENZOCAINE-MENTHOL 20-0.5 % EX AERO: 1.0000 | INHALATION_SPRAY | CUTANEOUS | Status: DC | PRN

## 2023-01-15 MED ORDER — ACETAMINOPHEN 325 MG PO TABS: 650.0000 mg | ORAL_TABLET | ORAL | Status: DC | PRN

## 2023-01-15 MED ORDER — WITCH HAZEL-GLYCERIN EX PADS
1.0000 | MEDICATED_PAD | CUTANEOUS | 12 refills | Status: DC | PRN
  Filled 2023-01-15: qty 40, fill #0

## 2023-01-15 MED ORDER — DIBUCAINE (PERIANAL) 1 % EX OINT: 1.0000 | TOPICAL_OINTMENT | CUTANEOUS | Status: DC | PRN

## 2023-01-15 MED ORDER — COCONUT OIL OIL: 1.0000 | TOPICAL_OIL | 0 refills | Status: DC | PRN

## 2023-01-15 MED ORDER — IBUPROFEN 600 MG PO TABS
600.0000 mg | ORAL_TABLET | Freq: Four times a day (QID) | ORAL | 0 refills | Status: DC
  Filled 2023-01-15: qty 30, 8d supply, fill #0

## 2023-01-15 NOTE — Progress Notes (Addendum)
Patient states that she can only feel parts of her right foot since delivery. She states she can feel her heel but the toes have pins and needles feeling. Pt able to bear weight on the right foot and walk to bathroom without an assistive device. I did tell pt if she felt like she needed assistance to restroom, to call the nurses station before getting up. Encouraged pt to notify MD during morning rounds, if not improved.

## 2023-01-15 NOTE — Anesthesia Postprocedure Evaluation (Signed)
Anesthesia Post Note  Patient: Barbara Cole  Procedure(s) Performed: AN AD [place]     Patient location during evaluation: Mother Baby Anesthesia Type: Epidural Level of consciousness: awake Pain management: pain level controlled Vital Signs Assessment: post-procedure vital signs reviewed and stable Respiratory status: spontaneous breathing, nonlabored ventilation and respiratory function stable Cardiovascular status: stable Postop Assessment: no headache, no backache and epidural receding Anesthetic complications: no Comments: Patient reports weakness with dorsiflexion of her right foot. She also reports numbness on the top of her foot and her big toe. She has normal sensation on the bottom of her foot and with plantar flexion. She has been able to get up and go to the bathroom. She has no difficulty voiding. She denies headache, fever, and back pain. She denies any numbness or weakness anywhere else. Discussed with patient that this sounds like the common peroneal nerve, which can happen during labor with grasping and flexing of the knee and that I don't think it is related to her epidural given her lack of other symptoms. I discussed that this will most likely resolve on her own, and to follow up with her doctor if it does not improve. I offered to call tomorrow to see if there was improvement, and she stated she would just follow up with her doctor.   No notable events documented.  Last Vitals:  Vitals:   01/15/23 0125 01/15/23 0530  BP: 126/84 118/78  Pulse: 83 81  Resp: 17 16  Temp: 36.6 C 36.6 C  SpO2: 98% 99%    Last Pain:  Vitals:   01/15/23 1140  TempSrc:   PainSc: 0-No pain   Pain Goal: Patients Stated Pain Goal: 2 (01/15/23 0830)                 Nilda Simmer

## 2023-01-15 NOTE — Lactation Note (Signed)
This note was copied from a baby's chart. Lactation Consultation Note  Patient Name: Barbara Cole [id number] Date: 01/15/2023 Age : 34 hours  Reason for consult: Follow-up assessment;Early term 37-38.6wks;Infant weight loss;Exclusive pumping and bottle feeding (1 % weight loss ,) Possible D/C for today per [facility] huddle.  LC reviewed BF D/C teaching for pumping . LC discussed Supply and demand, importance of consistent pumping 8- 10 times a day both breast to establish and protect the milk supply.  Per mom the #21 F is comfortable.  Maternal Data Has patient been taught Hand Expression?: Yes (per mom getting more drops hand expressing compared to pumping with the DEBP)  Feeding Mother's Current Feeding Choice: Breast Milk and Formula Nipple Type: Slow - flow    Lactation Tools Discussed/Used Tools: Pump;Flanges Flange Size: 24;21 Breast pump type: Double-Electric Breast Pump Pump Education: Milk Storage  Interventions Interventions: Breast feeding basics reviewed;DEBP;Education  Discharge Discharge Education: Engorgement and breast care Pump: Personal;DEBP  Consult Status Consult Status: Complete Date: 01/15/23    Jerlyn Ly Ayjah Show 01/15/2023, 12:14 PM

## 2023-01-15 NOTE — Discharge Summary (Addendum)
OB Discharge Summary  Patient Name: Barbara Cole DOB: 11/24/1991 MRN: [id number]  Date of admission: 01/14/2023 Delivering provider: Brien Few   Admitting diagnosis: Encounter for induction of labor [Z34.90] Intrauterine pregnancy: [redacted]w[redacted]d     Secondary diagnosis: Patient Active Problem List   Diagnosis Date Noted   Postpartum care following vaginal delivery 1/16 01/15/2023   SVD (spontaneous vaginal delivery) 01/15/2023   Encounter for induction of labor 01/14/2023   Additional problems:none   Date of discharge: 01/15/2023   Discharge diagnosis: Principal Problem:   Postpartum care following vaginal delivery 1/16 Active Problems:   Encounter for induction of labor   SVD (spontaneous vaginal delivery)                                                              Post partum procedures: none  Augmentation: AROM and Pitocin Pain control: Epidural  Laceration:None  Episiotomy:None  Complications: None  Hospital course:  Induction of Labor With Vaginal Delivery   32 y.o. yo G2P2002 at [redacted]w[redacted]d was admitted to the hospital 01/14/2023 for induction of labor.  Indication for induction: A2 DM.  Patient had an labor course complicated by none Membrane Rupture Time/Date: 9:19 AM ,01/14/2023   Delivery Method:Vaginal, Spontaneous  Episiotomy: None  Lacerations:  None  Details of delivery can be found in separate delivery note.  Patient had a postpartum course complicated by family discord d/t legal/social challenges of partner. Social services consult pending prior to discharge, medication regimen for anxiety addressed with Zoloft 25 mg, patient to titrate up to 50 mg if tolerated, and close follow up in office in 2 weeks and as needed.  Patient is discharged home 01/15/23.  Newborn Data: Birth date:01/14/2023  Birth time:2:14 PM  Gender:Female  Living status:Living  Apgars:9 ,9  Weight:3790 g   Subjective: Patient reports right foot numbness, able to ambulate. 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  Anesthesia consult pending and patient advised will likely resolve but will follow up as appropriately. Also noted perineal pain, likely from hemorrhoidal flare up, and comfort measures discussed.  Otherwise, doing well, voiding appropriately, bleeding small, working on breastfeeding.  Family in room at time of visit, unable to address in depth social concerns, will have social services follow prior to discharge.   Physical exam  Vitals:   01/14/23 1715 01/14/23 2120 01/15/23 0125 01/15/23 0530  BP: 127/88 119/82 126/84 118/78  Pulse: 92 87 83 81  Resp: 17 17 17 16   Temp: 98.3 F (36.8 C) 98.4 F (36.9 C) 97.9 F (36.6 C) 97.8 F (36.6 C)  TempSrc: Oral Oral Oral Oral  SpO2: 99% 98% 98% 99%  Weight:      Height:       General: alert, cooperative, and no distress Lochia: appropriate Uterine Fundus: firm Perineum: repair none, absent edema DVT Evaluation: No evidence of DVT seen on physical exam. Labs: Lab Results  Component Value Date   WBC 11.0 (H) 01/15/2023   HGB 10.4 (L) 01/15/2023   HCT 30.6 (L) 01/15/2023   MCV 89.7 01/15/2023   PLT 251 01/15/2023      Latest Ref Rng & Units 08/15/2019    3:06 PM  CMP  Glucose 70 -  99 mg/dL 94   BUN 6 - 20 mg/dL 11   Creatinine 6.06 - 1.00 mg/dL 3.01   Sodium 601 - 093 mmol/L 137   Potassium 3.5 - 5.1 mmol/L 3.7   Chloride 98 - 111 mmol/L 99   CO2 22 - 32 mmol/L 25   Calcium 8.9 - 10.3 mg/dL 9.5       2/35/5732    8:30 AM  Edinburgh Postnatal Depression Scale Screening Tool  I have been able to laugh and see the funny side of things. 0  I have looked forward with enjoyment to things. 0  I have blamed myself unnecessarily when things went wrong. 1  I have been anxious or worried for no good reason. 2  I have felt scared or panicky for no good reason. 2  Things have been getting on top of me. 1  I have been so unhappy that I have  had difficulty sleeping. 1  I have felt sad or miserable. 1  I have been so unhappy that I have been crying. 1  The thought of harming myself has occurred to me. 0  Edinburgh Postnatal Depression Scale Total 9   Mood: Noted flat affect. Social work has completed consult. New social work consult ordered related to FOB social/legal history. Close follow up planned in office.  Vaccines: TDaP         declined         Flu Declined  Discharge instruction:  per After Visit Summary,  [facility] OB booklet and  "Understanding Mother & Baby Care" hospital booklet After Visit Meds:  Allergies as of 01/15/2023       Reactions   Antacid Medicine [traumeel] Other (See Comments)   High doses of antacids were causing dizziness.  Not sure if she still has a stomach ulcer so her MD d/c'd these meds   Hydrocodone Rash   Was listed as anaphylaxis but that was incorrect per pt report.        Medication List     TAKE these medications    acetaminophen 325 MG tablet Commonly known as: Tylenol Take 2 tablets (650 mg total) by mouth every 4 (four) hours as needed (for pain scale < 4).   benzocaine-Menthol 20-0.5 % Aero Commonly known as: DERMOPLAST Apply 1 Application topically as needed for irritation (perineal discomfort).   busPIRone 5 MG tablet Commonly known as: BUSPAR Take 1 tablet by mouth twice a day as needed. (Take 1 tablet twice a day by oral route as needed for 30 days.)   coconut oil Oil Apply 1 Application topically as needed.   dibucaine 1 % Oint Commonly known as: NUPERCAINAL Place 1 Application rectally as needed for hemorrhoids.   docusate sodium 100 MG capsule Commonly known as: COLACE Take 100 mg by mouth daily.   ibuprofen 600 MG tablet Commonly known as: ADVIL Take 1 tablet (600 mg total) by mouth every 6 (six) hours.   PRENATAL VITAMIN PO Take 1 tablet by mouth daily.   sertraline 50 MG tablet Commonly known as: Zoloft Take 1/2 tablet by mouth daily for first  week, then increase to one tablet daily (1/2 tablet for first week then increase to one tablet daily)   witch hazel-glycerin pad Commonly known as: TUCKS Apply 1 Application topically as needed for hemorrhoids.               Discharge Care Instructions  (From admission, onward)           Start  Ordered   01/15/23 0000  Discharge wound care:       Comments: Sitz baths 2 times /day with warm water x 1 week. May add herbals: 1 ounce dried comfrey leaf* 1 ounce calendula flowers 1 ounce lavender flowers  Supplies can be found online at [employer] sources at [employer], [employer]  1/2 ounce dried uva ursi leaves 1/2 ounce witch hazel blossoms (if you can find them) 1/2 ounce dried sage leaf 1/2 cup sea salt Directions: Bring 2 quarts of water to a boil. Turn off heat, and place 1 ounce (approximately 1 large handful) of the above mixed herbs (not the salt) into the pot. Steep, covered, for 30 minutes.  Strain the liquid well with a fine mesh strainer, and discard the herb material. Add 2 quarts of liquid to the tub, along with the 1/2 cup of salt. This medicinal liquid can also be made into compresses and peri-rinses.   01/15/23 1028           Diet: routine diet Activity: Advance as tolerated. Pelvic rest for 6 weeks.  Postpartum contraception: TBA in office Newborn Data: Live born female  Birth Weight: 8 lb 5.7 oz (3790 g) APGAR: 52, 9  Newborn Delivery   Birth date/time: 01/14/2023 14:14:00 Delivery type: Vaginal, Spontaneous      named [employer] Feeding: Breast Disposition:home with mother Delivery Report: Review the Delivery Report for details.   Follow up:  Follow-up Information     Brien Few, MD. Schedule an appointment as soon as possible for a visit in 2 week(s).   Specialty: Obstetrics and Gynecology Why: For Postpartum follow-up / mood assessment Contact information: [place]  [address] [phone]                Signed: Conan Bowens, SNM 01/15/2023, 10:43 AM    Medical screening examination/treatment/procedure(s) were conducted as a shared visit with non-physician practitioner(s) and myself.  I personally evaluated the patient during the encounter.   Juliene Pina, CNM, MSN 01/15/2023, 11:25 AM

## 2023-01-15 NOTE — Progress Notes (Signed)
MOB was referred for history of depression/anxiety. * Referral screened out by Clinical Social Worker because none of the following criteria appear to apply: ~ History of anxiety/depression during this pregnancy, or of post-partum depression following prior delivery. ~ Diagnosis of anxiety and/or depression within last 3 years OR * MOB's symptoms currently being treated with medication and/or therapy. MOB has an active Rx for Zoloft and Buspar.  MOB also meets with an outpatient therapist routinely. No concerns were noted in OB record.   Please contact the Clinical Social Worker if needs arise, by [facility] request, or if MOB scores greater than 9/yes to question 10 on Edinburgh Postpartum Depression Screen.   Laurey Arrow, MSW, LCSW Clinical Social Work [phone]

## 2023-01-15 NOTE — Discharge Instructions (Signed)
Lactation outpatient support - home visit   Jessica Bowers, IBCLC (lactation consultant)  & Birth Doula  Phone (text or call): [phone] Email: [email or web address]   Linda Coppola RN, MHA, IBCLC at [facility]: Lactation Consultant  [email or web address] Mail: [email or web address] Tel: [phone]   Additional breastfeeding resources:  [facility] https://ibconline.ca/information-sheets/  [employer] [place]  www.lllofnc.org   Other Resources:  Chiropractic specialist   Dr. Leanna Hastings [email or web address]   Craniosacral therapy for baby  Erin Balkind  [email or web address]  Pediatric Dentist (for tongue ties)  Dr. Kate Lambert Spangler, [facility]  Phone: [phone]  [address] [place] [address] [email or web address]  

## 2023-01-15 NOTE — Social Work (Addendum)
CSW received consult for concerns regarding FOB. CSW made aware the FOB was arrested for child exploitation in July of 2023. CSW   contacted the [place] Magistrates office (Magistrate Hunter) and was notified there were no Pre trial conditions. CSW was directed to the [place] Clerk of Court (Allie Hazelwood)and was notified there were not pre trial conditions listed. CSW inquired if this was typical Clerk of Court reported this was not typical and explained FOB was bonded out prior to his court date and no pre trail condition were set. CSW also spoke with the Detective (A. Hollan) on the case who confirmed there were no Pre trial conditions set. With this information FOB is allowed to be in the hospital with MOB.   Barbara Cole, LCSWA Clinical Social Worker [phone] 

## 2023-01-22 ENCOUNTER — Telehealth (HOSPITAL_COMMUNITY): Payer: Self-pay | Admitting: *Deleted

## 2023-01-22 DIAGNOSIS — R69 Illness, unspecified: Secondary | ICD-10-CM | POA: Diagnosis not present

## 2023-01-22 NOTE — Telephone Encounter (Signed)
Attempted Hospital Discharge Follow-Up Call.  Left voice mail requesting that patient return RN's phone call if patient has any concerns or questions regarding herself or her baby.  

## 2023-01-28 DIAGNOSIS — R69 Illness, unspecified: Secondary | ICD-10-CM | POA: Diagnosis not present

## 2023-02-07 ENCOUNTER — Other Ambulatory Visit: Payer: Self-pay

## 2023-02-11 ENCOUNTER — Ambulatory Visit: Payer: 59 | Admitting: Urology

## 2023-02-15 ENCOUNTER — Encounter (HOSPITAL_COMMUNITY): Payer: Self-pay | Admitting: Obstetrics and Gynecology

## 2023-02-15 ENCOUNTER — Inpatient Hospital Stay (HOSPITAL_COMMUNITY): Payer: 59

## 2023-02-15 ENCOUNTER — Inpatient Hospital Stay (HOSPITAL_COMMUNITY)
Admission: AD | Admit: 2023-02-15 | Discharge: 2023-02-15 | Disposition: A | Discharge status: Home / Self Care | Payer: 59 | Attending: Obstetrics and Gynecology | Admitting: Obstetrics and Gynecology

## 2023-02-15 DIAGNOSIS — R519 Headache, unspecified: Secondary | ICD-10-CM | POA: Insufficient documentation

## 2023-02-15 DIAGNOSIS — Z793 Long term (current) use of hormonal contraceptives: Secondary | ICD-10-CM | POA: Insufficient documentation

## 2023-02-15 DIAGNOSIS — F419 Anxiety disorder, unspecified: Secondary | ICD-10-CM | POA: Diagnosis not present

## 2023-02-15 DIAGNOSIS — Z79899 Other long term (current) drug therapy: Secondary | ICD-10-CM | POA: Diagnosis not present

## 2023-02-15 DIAGNOSIS — F32A Depression, unspecified: Secondary | ICD-10-CM | POA: Diagnosis not present

## 2023-02-15 DIAGNOSIS — N8500 Endometrial hyperplasia, unspecified: Secondary | ICD-10-CM | POA: Diagnosis not present

## 2023-02-15 DIAGNOSIS — R69 Illness, unspecified: Secondary | ICD-10-CM | POA: Diagnosis not present

## 2023-02-15 DIAGNOSIS — R9389 Abnormal findings on diagnostic imaging of other specified body structures: Secondary | ICD-10-CM | POA: Insufficient documentation

## 2023-02-15 DIAGNOSIS — N939 Abnormal uterine and vaginal bleeding, unspecified: Secondary | ICD-10-CM | POA: Insufficient documentation

## 2023-02-15 LAB — CBC
HCT: 38.8 % (ref 36.0–46.0)
Hemoglobin: 13.1 g/dL (ref 12.0–15.0)
MCH: 29.4 pg (ref 26.0–34.0)
MCHC: 33.8 g/dL (ref 30.0–36.0)
MCV: 87.2 fL (ref 80.0–100.0)
Platelets: 290 10*3/uL (ref 150–400)
RBC: 4.45 MIL/uL (ref 3.87–5.11)
RDW: 12 % (ref 11.5–15.5)
WBC: 7.8 10*3/uL (ref 4.0–10.5)
nRBC: 0 % (ref 0.0–0.2)

## 2023-02-15 MED ORDER — ACETAMINOPHEN 500 MG PO TABS: 1000.0000 mg | ORAL_TABLET | Freq: Four times a day (QID) | ORAL | Status: DC | PRN

## 2023-02-15 NOTE — MAU Provider Note (Signed)
History     CSN: [id number]  Arrival date and time: 02/15/23 1258   Event Date/Time   First Provider Initiated Contact with Patient 02/15/23 1333      Chief Complaint  Patient presents with   Vaginal Bleeding   Headache   32 y.o. G2P2 s/p SVD ~4.5 wks ago presenting with VB. States she had mostly stopped bleeding then began again about a week ago. Endorses intermittent sharp abdominal pain at that time. She has continued to have small gushes of blood and small clots over the last 3 days. Today this happened 3 times. Endorses dizziness. No syncope. Reports HA that started after arrival here. Denies fever but reports occasional chills. She is unsure if she has noticed vaginal malodor.     OB History     Gravida  2   Para  2   Term  2   Preterm  0   AB  0   Living  2      SAB  0   IAB  0   Ectopic  0   Multiple  0   Live Births  2           Past Medical History:  Diagnosis Date   Anal fissure    Anxiety    Depression    Headache    MIGRAINES   History of kidney stones 07/2019   right kidney stone currently   Stomach ulcer     Past Surgical History:  Procedure Laterality Date   CHOLECYSTECTOMY N/A 08/25/2019   Procedure: LAPAROSCOPIC CHOLECYSTECTOMY;  Surgeon: Herbert Pun, MD;  Location: [facility] ORS;  Service: General;  Laterality: N/A;   COLONOSCOPY  AGE 75   CYSTOSCOPY W/ RETROGRADES Bilateral 02/19/2021   Procedure: CYSTOSCOPY WITH RETROGRADE PYELOGRAM;  Surgeon: Hollice Espy, MD;  Location: [facility] ORS;  Service: Urology;  Laterality: Bilateral;   CYSTOSCOPY/URETEROSCOPY/HOLMIUM LASER/STENT PLACEMENT Right 02/19/2021   Procedure: CYSTOSCOPY/URETEROSCOPY/HOLMIUM LASER/STENT PLACEMENT;  Surgeon: Hollice Espy, MD;  Location: [facility] ORS;  Service: Urology;  Laterality: Right;   ESOPHAGOGASTRODUODENOSCOPY (EGD) WITH PROPOFOL N/A 07/12/2019   Procedure: ESOPHAGOGASTRODUODENOSCOPY (EGD) WITH PROPOFOL;  Surgeon: Lollie Sails, MD;  Location:  [facility] ENDOSCOPY;  Service: Endoscopy;  Laterality: N/A;   WISDOM TOOTH EXTRACTION      Family History  Problem Relation Age of Onset   Diabetes Maternal Grandfather    Cancer Maternal Grandfather    Pancreatic cancer Maternal Grandfather    Crohn's disease Maternal Grandfather    Colon cancer Neg Hx     Social History   Tobacco Use   Smoking status: Never   Smokeless tobacco: Never  Vaping Use   Vaping Use: Never used  Substance Use Topics   Alcohol use: Not Currently   Drug use: Never    Allergies:  Allergies  Allergen Reactions   Antacid Medicine [Traumeel] Other (See Comments)    High doses of antacids were causing dizziness.  Not sure if she still has a stomach ulcer so her MD d/c'd these meds   Hydrocodone Rash    Was listed as anaphylaxis but that was incorrect per pt report.    Medications Prior to Admission  Medication Sig Dispense Refill Last Dose   acetaminophen (TYLENOL) 325 MG tablet Take 2 tablets (650 mg total) by mouth every 4 (four) hours as needed (for pain scale < 4).   Past Month   docusate sodium (COLACE) 100 MG capsule Take 100 mg by mouth daily.   Past Week at ds  ibuprofen (ADVIL) 600 MG tablet Take 1 tablet (600 mg total) by mouth every 6 (six) hours. 30 tablet 0 Past Month   medroxyPROGESTERone (PROVERA) 10 MG tablet Take 10 mg by mouth in the morning, at noon, and at bedtime.   02/15/2023   Prenatal Vit-Fe Fumarate-FA (PRENATAL VITAMIN PO) Take 1 tablet by mouth daily.   02/14/2023   witch hazel-glycerin (TUCKS) pad Apply 1 Application topically as needed for hemorrhoids. 40 each 12 Past Month   benzocaine-Menthol (DERMOPLAST) 20-0.5 % AERO Apply 1 Application topically as needed for irritation (perineal discomfort).   Unknown   busPIRone (BUSPAR) 5 MG tablet Take 1 tablet twice a day by oral route as needed for 30 days. (Patient not taking: Reported on 02/15/2023) 60 tablet 2 Unknown   coconut oil OIL Apply 1 Application topically as needed.  0  Unknown   dibucaine (NUPERCAINAL) 1 % OINT Place 1 Application rectally as needed for hemorrhoids.   Unknown   sertraline (ZOLOFT) 50 MG tablet 1/2 tablet for first week then increase to one tablet daily 90 tablet 2 Unknown    Review of Systems  Constitutional:  Positive for chills. Negative for fever.  Gastrointestinal:  Negative for abdominal pain.  Genitourinary:  Positive for vaginal bleeding.  Neurological:  Positive for dizziness and headaches.   Physical Exam   Blood pressure 118/81, pulse 84, temperature 98.8 F (37.1 C), temperature source Oral, resp. rate 18, height 5' 2"$  (1.575 m), weight 89.7 kg, SpO2 98 %, not currently breastfeeding.  Physical Exam Vitals and nursing note reviewed. Exam conducted with a chaperone present.  Constitutional:      General: She is not in acute distress.    Appearance: Normal appearance.  HENT:     Head: Normocephalic and atraumatic.  Cardiovascular:     Rate and Rhythm: Normal rate.  Pulmonary:     Effort: Pulmonary effort is normal. No respiratory distress.  Abdominal:     General: There is no distension.     Palpations: Abdomen is soft. There is no mass.     Tenderness: There is no abdominal tenderness. There is no guarding or rebound.     Hernia: No hernia is present.  Genitourinary:    Comments: External: no lesions or erythema Speculum: vagina rugated, pink, moist, small amt dark bloody discharge Bimanual:  uterus: non enlarged, non tender, no CMT adnexae: no masses, no tenderness left, no tenderness right Cervix nml   Musculoskeletal:        General: Normal range of motion.     Cervical back: Normal range of motion.  Skin:    General: Skin is warm and dry.  Neurological:     General: No focal deficit present.     Mental Status: She is oriented to person, place, and time.  Psychiatric:        Mood and Affect: Mood normal.        Behavior: Behavior normal.    Results for orders placed or performed during the hospital  encounter of 02/15/23 (from the past 24 hour(s))  CBC     Status: None   Collection Time: 02/15/23  1:53 PM  Result Value Ref Range   WBC 7.8 4.0 - 10.5 K/uL   RBC 4.45 3.87 - 5.11 MIL/uL   Hemoglobin 13.1 12.0 - 15.0 g/dL   HCT 38.8 36.0 - 46.0 %   MCV 87.2 80.0 - 100.0 fL   MCH 29.4 26.0 - 34.0 pg   MCHC 33.8 30.0 - 36.0  g/dL   RDW 12.0 11.5 - 15.5 %   Platelets 290 150 - 400 K/uL   nRBC 0.0 0.0 - 0.2 %   US PELVIS (TRANSABDOMINAL ONLY)  Result Date: 02/15/2023 CLINICAL DATA:  Postpartum bleeding.  Vaginal delivery 01/14/2023 EXAM: TRANSABDOMINAL ULTRASOUND OF PELVIS TECHNIQUE: Transabdominal ultrasound examination of the pelvis was performed including evaluation of the uterus, ovaries, adnexal regions, and pelvic cul-de-sac. COMPARISON:  None Available. FINDINGS: Uterus Measurements: Anteverted uterus normal in size measures 7.6 x 5.8 x 6.2 cm. = volume: 143 mL. No fibroids or other mass visualized. Endometrium Thickness: Mildly thickened endometrium at 16 mm. Heterogeneous echogenicity. No hypervascular tissue within the endometrium. Right ovary Measurements: 2.4 x 1.5 x 2.0 cm = volume: 3.9 mL. Normal appearance/no adnexal mass. Normal color Doppler flow Left ovary Measurements: 1.9 x 2.1 x 2.0 cm = volume: 4.1 mL. Normal appearance/no adnexal mass. Normal color Doppler Other findings:  No abnormal free fluid. IMPRESSION: 1. Mildly thickened heterogeneous endometrium without hypervascular tissue. 2. Normal ovaries Electronically Signed   By: Suzy Bouchard M.D.   On: 02/15/2023 14:59    MAU Course  Procedures  MDM Labs and [place] ordered and reviewed. Not orthostatic. Hgb stable, endometrium slightly thickened. Consult with Dr. Ilda Basset, recommends f/u in the office this week. Pt has appt in 3 days in office for [place]. Stable for discharge home.   Assessment and Plan   1. Postpartum state    Discharge home Follow up at [facility] as scheduled Return precautions  Allergies as of  02/15/2023       Reactions   Antacid Medicine [traumeel] Other (See Comments)   High doses of antacids were causing dizziness.  Not sure if she still has a stomach ulcer so her MD d/c'd these meds   Hydrocodone Rash   Was listed as anaphylaxis but that was incorrect per pt report.        Medication List     STOP taking these medications    benzocaine-Menthol 20-0.5 % Aero Commonly known as: DERMOPLAST   medroxyPROGESTERone 10 MG tablet Commonly known as: PROVERA       TAKE these medications    acetaminophen 325 MG tablet Commonly known as: Tylenol Take 2 tablets (650 mg total) by mouth every 4 (four) hours as needed (for pain scale < 4).   busPIRone 5 MG tablet Commonly known as: BUSPAR Take 1 tablet by mouth twice a day as needed. (Take 1 tablet twice a day by oral route as needed for 30 days.)   coconut oil Oil Apply 1 Application topically as needed.   dibucaine 1 % Oint Commonly known as: NUPERCAINAL Place 1 Application rectally as needed for hemorrhoids.   docusate sodium 100 MG capsule Commonly known as: COLACE Take 100 mg by mouth daily.   ibuprofen 600 MG tablet Commonly known as: ADVIL Take 1 tablet (600 mg total) by mouth every 6 (six) hours.   PRENATAL VITAMIN PO Take 1 tablet by mouth daily.   sertraline 50 MG tablet Commonly known as: Zoloft Take 1/2 tablet by mouth daily for first week, then increase to one tablet daily (1/2 tablet for first week then increase to one tablet daily)   witch hazel-glycerin pad Commonly known as: TUCKS Apply 1 Application topically as needed for hemorrhoids.        Julianne Handler, CNM 02/15/2023, 3:26 PM

## 2023-02-15 NOTE — MAU Note (Signed)
...  Barbara Cole is a 32 y.o. at 25w1dpost partum vaginal delivery here in MAU reporting: Last Friday, the 9th, she reports she was experiencing sharp abdominal pains that were accompanied with bright red vaginal bleeding. She reports her bleeding has ceased prior to this. She reports she called her OB and they told her this was normal and to keep an eye on her bleeding. She reports since then her bleeding has remained the same and she consistently passing bright red blood with small red blood clots the size of an eraser. She reports she has also been experiencing occasional dizziness but does not know if this is related to anxiety. She also endorses night sweats. Endorses a foul smelling odor. HA that started on the way here.  Onset of complaint: 1/9 Pain score: 3/10 HA

## 2023-02-17 ENCOUNTER — Other Ambulatory Visit: Payer: Self-pay

## 2023-02-17 MED ORDER — VALACYCLOVIR HCL 1 G PO TABS
1000.0000 mg | ORAL_TABLET | Freq: Two times a day (BID) | ORAL | 0 refills | Status: AC
  Filled 2023-02-17: qty 20, 10d supply, fill #0

## 2023-02-26 DIAGNOSIS — F4323 Adjustment disorder with mixed anxiety and depressed mood: Secondary | ICD-10-CM | POA: Diagnosis not present

## 2023-02-26 DIAGNOSIS — Z124 Encounter for screening for malignant neoplasm of cervix: Secondary | ICD-10-CM | POA: Diagnosis not present

## 2023-03-04 ENCOUNTER — Other Ambulatory Visit: Payer: Self-pay

## 2023-03-17 NOTE — Addendum Note (Signed)
Addended by: Kris Mouton on: 03/17/2023 02:09 PM   Modules accepted: Orders

## 2023-03-18 ENCOUNTER — Inpatient Hospital Stay: Admission: RE | Admit: 2023-03-18 | Payer: 59 | Source: Ambulatory Visit

## 2023-03-18 ENCOUNTER — Ambulatory Visit: Admission: RE | Admit: 2023-03-18 | Payer: 59 | Source: Ambulatory Visit

## 2023-03-18 DIAGNOSIS — F4323 Adjustment disorder with mixed anxiety and depressed mood: Secondary | ICD-10-CM | POA: Diagnosis not present

## 2023-03-21 ENCOUNTER — Ambulatory Visit: Payer: 59 | Admitting: Urology

## 2023-04-22 DIAGNOSIS — F4323 Adjustment disorder with mixed anxiety and depressed mood: Secondary | ICD-10-CM | POA: Diagnosis not present

## 2023-04-28 ENCOUNTER — Ambulatory Visit
Admission: RE | Admit: 2023-04-28 | Discharge: 2023-04-28 | Disposition: A | Discharge status: Home / Self Care | Payer: 59 | Source: Ambulatory Visit | Attending: Urology | Admitting: Urology

## 2023-04-28 DIAGNOSIS — N281 Cyst of kidney, acquired: Secondary | ICD-10-CM | POA: Diagnosis not present

## 2023-04-28 DIAGNOSIS — N2889 Other specified disorders of kidney and ureter: Secondary | ICD-10-CM

## 2023-04-28 DIAGNOSIS — N200 Calculus of kidney: Secondary | ICD-10-CM | POA: Diagnosis not present

## 2023-04-28 MED ORDER — GADOPICLENOL 0.5 MMOL/ML IV SOLN
8.0000 mL | Freq: Once | INTRAVENOUS | Status: AC | PRN
  Administered 2023-04-28: 8 mL via INTRAVENOUS

## 2023-05-06 DIAGNOSIS — F4323 Adjustment disorder with mixed anxiety and depressed mood: Secondary | ICD-10-CM | POA: Diagnosis not present

## 2023-05-09 ENCOUNTER — Ambulatory Visit: Payer: 59 | Admitting: Urology

## 2023-05-09 VITALS — BP 146/100 | Ht 62.0 in | Wt 204.0 lb

## 2023-05-09 DIAGNOSIS — N281 Cyst of kidney, acquired: Secondary | ICD-10-CM

## 2023-05-09 DIAGNOSIS — Q6102 Congenital multiple renal cysts: Secondary | ICD-10-CM

## 2023-05-09 DIAGNOSIS — N200 Calculus of kidney: Secondary | ICD-10-CM

## 2023-05-09 NOTE — Progress Notes (Signed)
I, Amy L Pierron, acting as a scribe for Vanna Scotland, MD.,have documented all relevant documentation on the behalf of Vanna Scotland, MD,as directed by  Vanna Scotland, MD while in the presence of Vanna Scotland, MD.  05/09/2023 8:59 AM   Barbara Cole [birth year] [id number]  Referring provider: Kandyce Rud, MD [phone] [address] [facility] [place],  [place] [address]  Chief Complaint  Patient presents with   Follow-up    HPI: 32 year-old female with a personal history of nephrolithiasis returns today for a follow-up to discuss management of her renal cysts and stones.  She was initially found to have a large, approximately 1.5 cm, renal pelvic stone. A year ago she was scheduled for a lithotripsy but became pregnant and had, presumably an uncomplicated pregnancy. She also has known renal cysts of varying size and complexity. She is following up with the renal protocol MRI today. She has multiple cysts left greater than right, including a Bosniak 52F renal cyst with enhancing septation in the inferior pole. Remains stable since 2020 with no changes.   She reports not having problems with the stone during pregnancy. It is not bothering her now, the hematuria is gone. She wonders if it came out when she gave birth to her daughter, it didn't. She is going through a lot personally and thinks she hasn't healed fully from pregnancy so she is wondering how long before she needs to have it done.  PMH: Past Medical History:  Diagnosis Date   Anal fissure    Anxiety    Depression    Headache    MIGRAINES   History of kidney stones 07/2019   right kidney stone currently   Stomach ulcer     Surgical History: Past Surgical History:  Procedure Laterality Date   CHOLECYSTECTOMY N/A 08/25/2019   Procedure: LAPAROSCOPIC CHOLECYSTECTOMY;  Surgeon: Carolan Shiver, MD;  Location: [facility] ORS;  Service: General;  Laterality: N/A;   COLONOSCOPY  AGE  19   CYSTOSCOPY W/ RETROGRADES Bilateral 02/19/2021   Procedure: CYSTOSCOPY WITH RETROGRADE PYELOGRAM;  Surgeon: Vanna Scotland, MD;  Location: [facility] ORS;  Service: Urology;  Laterality: Bilateral;   CYSTOSCOPY/URETEROSCOPY/HOLMIUM LASER/STENT PLACEMENT Right 02/19/2021   Procedure: CYSTOSCOPY/URETEROSCOPY/HOLMIUM LASER/STENT PLACEMENT;  Surgeon: Vanna Scotland, MD;  Location: [facility] ORS;  Service: Urology;  Laterality: Right;   ESOPHAGOGASTRODUODENOSCOPY (EGD) WITH PROPOFOL N/A 07/12/2019   Procedure: ESOPHAGOGASTRODUODENOSCOPY (EGD) WITH PROPOFOL;  Surgeon: Christena Deem, MD;  [facility] END[facility]COPY;  Service: Endoscopy;  Laterality: N/A;   WISDOM TOOTH EXTRACTION      Home Medications:  Allergies as of 05/09/2023       Reactions   Antacid Medicine [traumeel] Other (See Comments)   High doses of antacids were causing dizziness.  Not sure if she still has a stomach ulcer so her MD d/c'd these meds   Hydrocodone Rash   Was listed as anaphylaxis but that was incorrect per pt report.        Medication List        Accurate as of May 09, 2023  8:59 AM. If you have any questions, ask your nurse or doctor.          STOP taking these medications    acetaminophen 325 MG tablet Commonly known as: Tylenol   busPIRone 5 MG tablet Commonly known as: BUSPAR   coconut oil Oil   dibucaine 1 % Oint Commonly known as: NUPERCAINAL   docusate sodium 100 MG capsule Commonly known  as: COLACE   ibuprofen 600 MG tablet Commonly known as: ADVIL   PRENATAL VITAMIN PO   sertraline 50 MG tablet Commonly known as: Zoloft   witch hazel-glycerin pad Commonly known as: TUCKS       TAKE these medications    valACYclovir 1000 MG tablet Commonly known as: VALTREX Take 1 tablet (1,000 mg total) by mouth 2 (two) times daily for 10 days.        Allergies:  Allergies  Allergen Reactions   Antacid Medicine [Traumeel] Other (See Comments)    High doses of antacids were causing  dizziness.  Not sure if she still has a stomach ulcer so her MD d/c'd these meds   Hydrocodone Rash    Was listed as anaphylaxis but that was incorrect per pt report.    Family History: Family History  Problem Relation Age of Onset   Diabetes Maternal Grandfather    Cancer Maternal Grandfather    Pancreatic cancer Maternal Grandfather    Crohn's disease Maternal Grandfather    Colon cancer Neg Hx     Social History:  reports that she has never smoked. She has never used smokeless tobacco. She reports that she does not currently use alcohol. She reports that she does not use drugs.   Physical Exam: BP (!) 146/100   Ht 5\' 2"  (1.575 m)   Wt 204 lb (92.5 kg)   BMI 37.31 kg/m   Constitutional:  Alert and oriented, No acute distress. HEENT:  AT, moist mucus membranes.  Trachea midline, no masses. Neurologic: Grossly intact, no focal deficits, moving all 4 extremities. Psychiatric: Normal mood and affect.   Pertinent Imaging: CLINICAL DATA:  Follow-up renal cysts   EXAM: MRI ABDOMEN WITHOUT AND WITH CONTRAST   TECHNIQUE: Multiplanar multisequence MR imaging of the abdomen was performed both before and after the administration of intravenous contrast.   CONTRAST:  9 mL Vueway gadolinium contrast IV   COMPARISON:  CT abdomen pelvis, 04/17/2022, MR abdomen, 10/16/2020, 09/23/2019   FINDINGS: Lower chest: No acute abnormality.   Hepatobiliary: No solid liver abnormality is seen. Hepatic steatosis. Status post cholecystectomy. No gallstones, gallbladder wall thickening, or biliary dilatation.   Pancreas: Unremarkable. No pancreatic ductal dilatation or surrounding inflammatory changes.   Spleen: Normal in size without significant abnormality.   Adrenals/Urinary Tract: Adrenal glands are unremarkable. There is a 1.5 cm calculus in the right renal pelvis with mild associated hydronephrosis and peripelvic fat stranding (series 4, image 35). Additional nonobstructive  calculus of the superior pole of the right kidney. Numerous bilateral renal cortical cysts, the majority in the left kidney. In the inferior pole of the left kidney, there is a mildly heterogeneous, nonenhancing hemorrhagic or proteinaceous cyst, not significantly changed and benign (series 4, image 34). Additional thinly septated cysts of the superior pole of the left kidney (series 4, image 24) and anterior midportion (series 4, image 31), with some perceptible septal enhancement (series 14, image 63, 81). No left-sided hydronephrosis.   Stomach/Bowel: Stomach is within normal limits. No evidence of bowel wall thickening, distention, or inflammatory changes.   Vascular/Lymphatic: No significant vascular findings are present. No enlarged abdominal lymph nodes.   Other: No abdominal wall hernia or abnormality. No ascites.   Musculoskeletal: No acute or significant osseous findings.   IMPRESSION: 1. There is a 1.5 cm calculus in the right renal pelvis with mild associated hydronephrosis and peripelvic fat stranding. 2. Additional nonobstructive calculus of the superior pole of the right kidney. 3. Numerous bilateral  renal cortical cysts, the majority in the left kidney. In the inferior pole of the left kidney, there is a mildly heterogeneous, nonenhancing hemorrhagic or proteinaceous cyst, not significantly changed and benign (Bosniak category II). 4. Additional thinly septated cysts of the superior pole of the left kidney and anterior midportion, with some perceptible septal enhancement. These are consistent with Bosniak category IIF cysts but are without significant change in comparison to prior examinations dating back to 2020 and therefore can be considered benign with no specific further follow-up or characterization required. 5. Number and complexity of cysts in a patient of this age may reflect polycystic kidney disease. 6. Hepatic steatosis.   These results will be  called to the ordering clinician or representative by the Radiologist Assistant, and communication documented in the PACS or [employer].  Electronically Signed   By: Jearld Lesch M.D.   On: 04/29/2023 13:09  Personally reviewed today, compared with previous scans, and agree with radiologic interpretation.   Assessment & Plan:    Right renal pelvic stone   -  If left untreated it will continue to have inflammation and can start scarring can lead to kidney problems, hydronephrosis present  - We discussed various treatment options for urolithiasis including observation with or without medical expulsive therapy, shockwave lithotripsy (SWL), ureteroscopy and laser lithotripsy with stent placement, and percutaneous nephrolithotomy.   SWL has a lower stone free rate in a single procedure, but also a lower complication rate compared to ureteroscopy and avoids a stent and associated stent related symptoms. Possible complications include renal hematoma, steinstrasse, and need for additional treatment. We discussed the role of his increased skin to stone distance can lead to decreased efficacy with shockwave lithotripsy.   Ureteroscopy with laser lithotripsy and stent placement has a higher stone free rate than SWL in a single procedure, however increased complication rate including possible infection, ureteral injury, bleeding, and stent related morbidity. Common stent related symptoms include dysuria, urgency/frequency, and flank pain.   - Due to personal circumstances an aggressive approach is desired so as to take care of the stone completely in less time. She desires the ureteroscopy. She has had this before and struggled with the stent removal. But is willing to proceed since the failure rate is less than 10%. She is going to try to have it done within the next month.   Renal cysts  - Hasn't changed appearance in 4 years. Not polycystic kidney disease which she thought it was based on  [facility] descriptions. Radiologist says it is benign. Plan to re-check it in 5 years with an ultrasound.  Schedule surgery  [facility] [address], [address] [place], [place] [address] [phone]

## 2023-05-12 ENCOUNTER — Other Ambulatory Visit: Payer: Self-pay | Admitting: Urology

## 2023-05-12 DIAGNOSIS — N201 Calculus of ureter: Secondary | ICD-10-CM

## 2023-05-12 NOTE — Progress Notes (Signed)
Surgical Physician Order Form [facility]  * Scheduling expectation : Next Available  *Length of Case:   *Clearance needed: no  *Anticoagulation Instructions: Hold all anticoagulants  *Aspirin Instructions: Hold Aspirin  *Post-op visit Date/Instructions:  1 month with RUS prior  *Diagnosis: Right UPJ Stone  *Procedure: right Ureteroscopy w/laser lithotripsy & stent placement (95621)   Additional orders: N/A  -Admit type: OUTpatient  -Anesthesia: General  -VTE Prophylaxis Standing Order SCD's       Other:   -Standing Lab Orders Per Anesthesia    Lab other: UA&Urine Culture  -Standing Test orders EKG/Chest x-ray per Anesthesia       Test other:   - Medications:  Ancef 2gm IV  -Other orders:  N/A

## 2023-05-27 ENCOUNTER — Telehealth: Payer: Self-pay

## 2023-05-27 NOTE — Telephone Encounter (Signed)
I have called on 05/15/2023, 05/21/2023 and 05/27/2023 to schedule surgery. No answer and have left detailed messages each time. Will try again.

## 2023-05-28 DIAGNOSIS — F4323 Adjustment disorder with mixed anxiety and depressed mood: Secondary | ICD-10-CM | POA: Diagnosis not present

## 2023-06-04 ENCOUNTER — Other Ambulatory Visit: Payer: Self-pay

## 2023-06-04 MED ORDER — VALACYCLOVIR HCL 1 G PO TABS
1000.0000 mg | ORAL_TABLET | Freq: Two times a day (BID) | ORAL | 0 refills | Status: AC
  Filled 2023-06-04: qty 20, 10d supply, fill #0

## 2023-06-05 NOTE — Progress Notes (Signed)
   [facility] Surgical Posting Form  Surgery Date: Date: 07/24/2023  Surgeon: Dr. Vanna Scotland, MD  Inpt ( No  )   Outpt (Yes)   Obs ( No  )   Diagnosis: N20.1 Right Ureteropelvic Junction Stone  -CPT: [phone]  Surgery: Right Ureteroscopy with Laser Lithotripsy and Stent Placement   Stop Anticoagulations: Yes and also hold ASA  Cardiac/Medical/Pulmonary Clearance needed: no  *Orders entered into EPIC  Date: 06/05/23   *Case booked in [place]  Date: 06/04/2023  *Notified pt of Surgery: Date: 06/04/2023  PRE-OP UA & CX: yes, will obtain in clinic on 07/11/2023  *Placed into Prior Authorization Work Angela Nevin Date: 06/05/23  Assistant/laser/rep:No

## 2023-06-05 NOTE — Telephone Encounter (Signed)
I spoke with [facility]. We have discussed possible surgery dates and Thursday July 25th, 2024 was agreed upon by all parties. Patient given information about surgery date, what to expect pre-operatively and post operatively.  We discussed that a Pre-Admission Testing office will be calling to set up the pre-op visit that will take place prior to surgery, and that these appointments are typically done over the phone with a Pre-Admissions RN. Informed patient that our office will communicate any additional care to be provided after surgery. Patients questions or concerns were discussed during our call. Advised to call our office should there be any additional information, questions or concerns that arise. Patient verbalized understanding.

## 2023-06-25 DIAGNOSIS — F4323 Adjustment disorder with mixed anxiety and depressed mood: Secondary | ICD-10-CM | POA: Diagnosis not present

## 2023-06-30 DIAGNOSIS — N201 Calculus of ureter: Secondary | ICD-10-CM

## 2023-06-30 HISTORY — DX: Calculus of ureter: N20.1

## 2023-07-11 ENCOUNTER — Encounter: Payer: Self-pay | Admitting: Urology

## 2023-07-11 ENCOUNTER — Other Ambulatory Visit: Payer: 59

## 2023-07-14 ENCOUNTER — Other Ambulatory Visit: Payer: 59

## 2023-07-14 DIAGNOSIS — N201 Calculus of ureter: Secondary | ICD-10-CM | POA: Diagnosis not present

## 2023-07-14 LAB — URINALYSIS, COMPLETE
Bilirubin, UA: NEGATIVE
Glucose, UA: NEGATIVE
Ketones, UA: NEGATIVE
Nitrite, UA: NEGATIVE
Specific Gravity, UA: 1.025 (ref 1.005–1.030)
Urobilinogen, Ur: 0.2 mg/dL (ref 0.2–1.0)
pH, UA: 5.5 (ref 5.0–7.5)

## 2023-07-14 LAB — MICROSCOPIC EXAMINATION
Epithelial Cells (non renal): >10 /hpf — AB (ref 0–10)
WBC, UA: >30 /hpf — AB (ref 0–5)

## 2023-07-16 ENCOUNTER — Encounter
Admission: RE | Admit: 2023-07-16 | Discharge: 2023-07-16 | Disposition: A | Discharge status: Home / Self Care | Payer: 59 | Source: Ambulatory Visit | Attending: Urology | Admitting: Urology

## 2023-07-16 ENCOUNTER — Encounter: Payer: Self-pay | Admitting: *Deleted

## 2023-07-16 VITALS — Ht 62.0 in | Wt 203.9 lb

## 2023-07-16 DIAGNOSIS — Z01812 Encounter for preprocedural laboratory examination: Secondary | ICD-10-CM

## 2023-07-16 HISTORY — DX: Cyst of kidney, acquired: N28.1

## 2023-07-16 HISTORY — DX: Gastritis, unspecified, without bleeding: K29.70

## 2023-07-16 NOTE — Patient Instructions (Addendum)
Your procedure is scheduled on: Thursday, July 25 Report to the Registration Desk on the 1st floor of the [facility] To find out your arrival time, please call [phone] between 1PM - 3PM on: Wednesday, July 24 If your arrival time is 6:00 am, do not arrive before that time as the [facility] entrance doors do not open until 6:00 am.  REMEMBER: Instructions that are not followed completely may result in serious medical risk, up to and including death; or upon the discretion of your surgeon and anesthesiologist your surgery may need to be rescheduled.  Do not eat or drink after midnight the night before surgery.  No gum chewing or hard candies.  One week prior to surgery: starting July 18 Stop Anti-inflammatories (NSAIDS) such as Advil, Aleve, Ibuprofen, Motrin, Naproxen, Naprosyn and Aspirin based products such as Excedrin, Goody's Powder, BC Powder. Stop ANY OVER THE COUNTER supplements until after surgery. You may however, continue to take Tylenol if needed for pain up until the day of surgery.  DO NOT TAKE ANY MEDICATIONS THE MORNING OF SURGERY   No Smoking including e-cigarettes for 24 hours before surgery.  No chewable tobacco products for at least 6 hours before surgery.  No nicotine patches on the day of surgery.  Do not use any "recreational" drugs for at least a week (preferably 2 weeks) before your surgery.  Please be advised that the combination of cocaine and anesthesia may have negative outcomes, up to and including death. If you test positive for cocaine, your surgery will be cancelled.  On the morning of surgery brush your teeth with toothpaste and water, you may rinse your mouth with mouthwash if you wish. Do not swallow any toothpaste or mouthwash.  Do not wear jewelry, make-up, hairpins, clips or nail polish.  Do not wear lotions, powders, or perfumes.   Do not shave body hair from the neck down 48 hours before surgery.  Contact lenses, hearing aids and  dentures may not be worn into surgery.  Do not bring valuables to the hospital. [facility] is not responsible for any missing/lost belongings or valuables.   Notify your doctor if there is any change in your medical condition (cold, fever, infection).  Wear comfortable clothing (specific to your surgery type) to the hospital.  After surgery, you can help prevent lung complications by doing breathing exercises.  Take deep breaths and cough every 1-2 hours.   If you are being discharged the day of surgery, you will not be allowed to drive home. You will need a responsible individual to drive you home and stay with you for 24 hours after surgery.   If you are taking public transportation, you will need to have a responsible individual with you.  Please call the Pre-admissions Testing Dept. at [phone] if you have any questions about these instructions.  Surgery Visitation Policy:  Patients having surgery or a procedure may have two visitors.  Children under the age of 26 must have an adult with them who is not the patient.

## 2023-07-17 LAB — CULTURE, URINE COMPREHENSIVE

## 2023-07-24 ENCOUNTER — Ambulatory Visit: Payer: 59 | Admitting: Anesthesiology

## 2023-07-24 ENCOUNTER — Ambulatory Visit: Payer: 59 | Admitting: Urgent Care

## 2023-07-24 ENCOUNTER — Ambulatory Visit: Payer: 59

## 2023-07-24 ENCOUNTER — Ambulatory Visit
Admission: RE | Admit: 2023-07-24 | Discharge: 2023-07-24 | Disposition: A | Discharge status: Home / Self Care | Payer: 59 | Source: Ambulatory Visit | Attending: Urology | Admitting: Urology

## 2023-07-24 ENCOUNTER — Other Ambulatory Visit: Payer: Self-pay

## 2023-07-24 ENCOUNTER — Encounter
Admission: RE | Disposition: A | Discharge status: Home / Self Care | Payer: Self-pay | Source: Ambulatory Visit | Attending: Urology

## 2023-07-24 ENCOUNTER — Encounter: Payer: Self-pay | Admitting: Urology

## 2023-07-24 DIAGNOSIS — F419 Anxiety disorder, unspecified: Secondary | ICD-10-CM | POA: Diagnosis not present

## 2023-07-24 DIAGNOSIS — N201 Calculus of ureter: Secondary | ICD-10-CM

## 2023-07-24 DIAGNOSIS — F32A Depression, unspecified: Secondary | ICD-10-CM | POA: Diagnosis not present

## 2023-07-24 DIAGNOSIS — N132 Hydronephrosis with renal and ureteral calculous obstruction: Secondary | ICD-10-CM | POA: Diagnosis not present

## 2023-07-24 DIAGNOSIS — Z6837 Body mass index (BMI) 37.0-37.9, adult: Secondary | ICD-10-CM | POA: Insufficient documentation

## 2023-07-24 DIAGNOSIS — Z01812 Encounter for preprocedural laboratory examination: Secondary | ICD-10-CM

## 2023-07-24 DIAGNOSIS — N200 Calculus of kidney: Secondary | ICD-10-CM

## 2023-07-24 HISTORY — PX: CYSTOSCOPY/URETEROSCOPY/HOLMIUM LASER/STENT PLACEMENT: SHX6546

## 2023-07-24 HISTORY — PX: CYSTOSCOPY W/ RETROGRADES: SHX1426

## 2023-07-24 LAB — POCT PREGNANCY, URINE: Preg Test, Ur: NEGATIVE

## 2023-07-24 SURGERY — CYSTOSCOPY/URETEROSCOPY/HOLMIUM LASER/STENT PLACEMENT
Anesthesia: General | Site: Urethra | Laterality: Right

## 2023-07-24 MED ORDER — PROPOFOL 10 MG/ML IV BOLUS
INTRAVENOUS | Status: DC | PRN
  Administered 2023-07-24: 100 mg via INTRAVENOUS
  Administered 2023-07-24: 30 ug/kg/min via INTRAVENOUS

## 2023-07-24 MED ORDER — ONDANSETRON HCL 4 MG/2ML IJ SOLN
INTRAMUSCULAR | Status: AC
  Filled 2023-07-24: qty 2

## 2023-07-24 MED ORDER — MIDAZOLAM HCL 2 MG/2ML IJ SOLN
INTRAMUSCULAR | Status: DC | PRN
  Administered 2023-07-24: 2 mg via INTRAVENOUS

## 2023-07-24 MED ORDER — CHLORHEXIDINE GLUCONATE 0.12 % MT SOLN
15.0000 mL | Freq: Once | OROMUCOSAL | Status: AC
  Administered 2023-07-24: 15 mL via OROMUCOSAL

## 2023-07-24 MED ORDER — FAMOTIDINE 20 MG PO TABS
ORAL_TABLET | ORAL | Status: AC
  Filled 2023-07-24: qty 1

## 2023-07-24 MED ORDER — ONDANSETRON HCL 4 MG/2ML IJ SOLN
INTRAMUSCULAR | Status: DC | PRN
Start: 2023-07-24 — End: 2023-07-24
  Administered 2023-07-24: 4 mg via INTRAVENOUS

## 2023-07-24 MED ORDER — CEFAZOLIN SODIUM-DEXTROSE 2-4 GM/100ML-% IV SOLN
2.0000 g | INTRAVENOUS | Status: AC
  Administered 2023-07-24: 2 g via INTRAVENOUS

## 2023-07-24 MED ORDER — ROCURONIUM BROMIDE 100 MG/10ML IV SOLN
INTRAVENOUS | Status: DC | PRN
  Administered 2023-07-24: 10 mg via INTRAVENOUS
  Administered 2023-07-24: 30 mg via INTRAVENOUS
  Administered 2023-07-24: 10 mg via INTRAVENOUS

## 2023-07-24 MED ORDER — FENTANYL CITRATE (PF) 100 MCG/2ML IJ SOLN: 25.0000 ug | INTRAMUSCULAR | Status: DC | PRN

## 2023-07-24 MED ORDER — DEXMEDETOMIDINE HCL IN NACL 80 MCG/20ML IV SOLN
INTRAVENOUS | Status: DC | PRN
  Administered 2023-07-24 (×2): 8 ug via INTRAVENOUS

## 2023-07-24 MED ORDER — CHLORHEXIDINE GLUCONATE 0.12 % MT SOLN
OROMUCOSAL | Status: AC
  Filled 2023-07-24: qty 15

## 2023-07-24 MED ORDER — SUGAMMADEX SODIUM 200 MG/2ML IV SOLN
INTRAVENOUS | Status: DC | PRN
  Administered 2023-07-24: 200 mg via INTRAVENOUS

## 2023-07-24 MED ORDER — FAMOTIDINE 20 MG PO TABS: 20.0000 mg | ORAL_TABLET | Freq: Once | ORAL | Status: DC

## 2023-07-24 MED ORDER — FENTANYL CITRATE (PF) 100 MCG/2ML IJ SOLN
INTRAMUSCULAR | Status: AC
  Filled 2023-07-24: qty 2

## 2023-07-24 MED ORDER — ACETAMINOPHEN 10 MG/ML IV SOLN
INTRAVENOUS | Status: AC
  Filled 2023-07-24: qty 100

## 2023-07-24 MED ORDER — KETOROLAC TROMETHAMINE 30 MG/ML IJ SOLN
INTRAMUSCULAR | Status: DC | PRN
  Administered 2023-07-24: 30 mg via INTRAVENOUS

## 2023-07-24 MED ORDER — OXYBUTYNIN CHLORIDE 5 MG PO TABS: 5.0000 mg | ORAL_TABLET | Freq: Three times a day (TID) | ORAL | 0 refills | Status: DC | PRN

## 2023-07-24 MED ORDER — FENTANYL CITRATE (PF) 100 MCG/2ML IJ SOLN
INTRAMUSCULAR | Status: DC | PRN
  Administered 2023-07-24: 50 ug via INTRAVENOUS

## 2023-07-24 MED ORDER — LIDOCAINE HCL (CARDIAC) PF 100 MG/5ML IV SOSY
PREFILLED_SYRINGE | INTRAVENOUS | Status: DC | PRN
  Administered 2023-07-24: 100 mg via INTRAVENOUS

## 2023-07-24 MED ORDER — SODIUM CHLORIDE 0.9 % IR SOLN
Status: DC | PRN
  Administered 2023-07-24: 3000 mL

## 2023-07-24 MED ORDER — STERILE WATER FOR IRRIGATION IR SOLN
Status: DC | PRN
  Administered 2023-07-24: 500 mL

## 2023-07-24 MED ORDER — LACTATED RINGERS IV SOLN: INTRAVENOUS | Status: DC

## 2023-07-24 MED ORDER — TAMSULOSIN HCL 0.4 MG PO CAPS: 0.4000 mg | ORAL_CAPSULE | Freq: Every day | ORAL | 0 refills | Status: AC

## 2023-07-24 MED ORDER — ORAL CARE MOUTH RINSE: 15.0000 mL | Freq: Once | OROMUCOSAL | Status: AC

## 2023-07-24 MED ORDER — DEXAMETHASONE SODIUM PHOSPHATE 10 MG/ML IJ SOLN
INTRAMUSCULAR | Status: DC | PRN
  Administered 2023-07-24: 10 mg via INTRAVENOUS

## 2023-07-24 MED ORDER — CEFAZOLIN SODIUM-DEXTROSE 2-4 GM/100ML-% IV SOLN
INTRAVENOUS | Status: AC
  Filled 2023-07-24: qty 100

## 2023-07-24 MED ORDER — IOHEXOL 180 MG/ML  SOLN
INTRAMUSCULAR | Status: DC | PRN
  Administered 2023-07-24: 10 mL

## 2023-07-24 MED ORDER — ONDANSETRON HCL 4 MG/2ML IJ SOLN
4.0000 mg | Freq: Once | INTRAMUSCULAR | Status: AC | PRN
  Administered 2023-07-24: 4 mg via INTRAVENOUS

## 2023-07-24 MED ORDER — ACETAMINOPHEN 10 MG/ML IV SOLN
INTRAVENOUS | Status: DC | PRN
  Administered 2023-07-24: 1000 mg via INTRAVENOUS

## 2023-07-24 MED ORDER — MIDAZOLAM HCL 2 MG/2ML IJ SOLN
INTRAMUSCULAR | Status: AC
  Filled 2023-07-24: qty 2

## 2023-07-24 SURGICAL SUPPLY — 30 items
ADH LQ OCL WTPRF AMP STRL LF (MISCELLANEOUS)
ADHESIVE MASTISOL STRL (MISCELLANEOUS) IMPLANT
BAG DRAIN SIEMENS DORNER NS (MISCELLANEOUS) ×2 IMPLANT
BAG DRN NS LF (MISCELLANEOUS) ×2
BASKET ZERO TIP 1.9FR (BASKET) IMPLANT
BRUSH SCRUB EZ 1% IODOPHOR (MISCELLANEOUS) ×2 IMPLANT
BSKT STON RTRVL ZERO TP 1.9FR (BASKET)
CATH URET FLEX-TIP 2 LUMEN 10F (CATHETERS) IMPLANT
CATH URETL OPEN 5X70 (CATHETERS) ×2 IMPLANT
CNTNR URN SCR LID CUP LEK RST (MISCELLANEOUS) IMPLANT
CONT SPEC 4OZ STRL OR WHT (MISCELLANEOUS)
DRAPE UTILITY 15X26 TOWEL STRL (DRAPES) ×2 IMPLANT
DRSG TEGADERM 2-3/8X2-3/4 SM (GAUZE/BANDAGES/DRESSINGS) IMPLANT
FIBER LASER FLEXIVA PULSE 365 (Laser) IMPLANT
FIBER LASER MOSES 200 DFL (Laser) IMPLANT
GLOVE BIO SURGEON STRL SZ 6.5 (GLOVE) ×2 IMPLANT
GOWN STRL REUS W/ TWL LRG LVL3 (GOWN DISPOSABLE) ×4 IMPLANT
GOWN STRL REUS W/TWL LRG LVL3 (GOWN DISPOSABLE) ×4
GUIDEWIRE GREEN .038 145CM (MISCELLANEOUS) IMPLANT
GUIDEWIRE STR DUAL SENSOR (WIRE) ×2 IMPLANT
IV NS IRRIG 3000ML ARTHROMATIC (IV SOLUTION) ×2 IMPLANT
KIT TURNOVER CYSTO (KITS) ×2 IMPLANT
PACK CYSTO AR (MISCELLANEOUS) ×2 IMPLANT
SET CYSTO W/LG BORE CLAMP LF (SET/KITS/TRAYS/PACK) ×2 IMPLANT
SHEATH NAVIGATOR HD 12/14X36 (SHEATH) IMPLANT
STENT URET 6FRX22 CONTOUR (STENTS) IMPLANT
STENT URET 6FRX24 CONTOUR (STENTS) IMPLANT
STENT URET 6FRX26 CONTOUR (STENTS) IMPLANT
SURGILUBE 2OZ TUBE FLIPTOP (MISCELLANEOUS) ×2 IMPLANT
WATER STERILE IRR 500ML POUR (IV SOLUTION) ×2 IMPLANT

## 2023-07-24 NOTE — Anesthesia Preprocedure Evaluation (Signed)
Anesthesia Evaluation  Patient identified by MRN, date of birth, ID band Patient awake    Reviewed: Allergy & Precautions, NPO status , Patient's Chart, lab work & pertinent test results  Airway Mallampati: III  TM Distance: <3 FB Neck ROM: Full    Dental  (+) Teeth Intact   Pulmonary neg pulmonary ROS   Pulmonary exam normal breath sounds clear to auscultation       Cardiovascular negative cardio ROS Normal cardiovascular exam Rhythm:Regular Rate:Normal     Neuro/Psych   Anxiety     negative neurological ROS  negative psych ROS   GI/Hepatic negative GI ROS, Neg liver ROS, PUD,,,  Endo/Other  negative endocrine ROS  Morbid obesity  Renal/GU      Musculoskeletal   Abdominal  (+) + obese  Peds negative pediatric ROS (+)  Hematology negative hematology ROS (+)   Anesthesia Other Findings Past Medical History: No date: Anal fissure No date: Anxiety No date: Depression No date: Gastritis No date: Headache     Comment:  MIGRAINES 07/2019: History of kidney stones 06/2023: Obstruction of right ureteropelvic junction due to stone No date: Renal cyst, left No date: Stomach ulcer  Past Surgical History: 08/25/2019: CHOLECYSTECTOMY; N/A     Comment:  Procedure: LAPAROSCOPIC CHOLECYSTECTOMY;  Surgeon:               Carolan Shiver, MD;  Location: [facility] ORS;  Service:              General;  Laterality: N/A; AGE 32: COLONOSCOPY 02/19/2021: CYSTOSCOPY W/ RETROGRADES; Bilateral     Comment:  Procedure: CYSTOSCOPY WITH RETROGRADE PYELOGRAM;                Surgeon: Vanna Scotland, MD;  Location: [facility] ORS;                Service: Urology;  Laterality: Bilateral; 02/19/2021: CYSTOSCOPY/URETEROSCOPY/HOLMIUM LASER/STENT PLACEMENT;  Right     Comment:  Procedure: CYSTOSCOPY/URETEROSCOPY/HOLMIUM LASER/STENT               PLACEMENT;  Surgeon: Vanna Scotland, MD;  Location: [facility]              ORS;  Service: Urology;   Laterality: Right; 07/12/2019: ESOPHAGOGASTRODUODENOSCOPY (EGD) WITH PROPOFOL; N/A     Comment:  Procedure: ESOPHAGOGASTRODUODENOSCOPY (EGD) WITH               PROPOFOL;  Surgeon: Christena Deem, MD;  Location:               [facility] ENDOSCOPY;  Service: Endoscopy;  Laterality: N/A; No date: WISDOM TOOTH EXTRACTION     Reproductive/Obstetrics negative OB ROS                             Anesthesia Physical Anesthesia Plan  ASA: 2  Anesthesia Plan: General   Post-op Pain Management:    Induction: Intravenous  PONV Risk Score and Plan: 1 and Dexamethasone  Airway Management Planned: Oral ETT  Additional Equipment:   Intra-op Plan:   Post-operative Plan: Extubation in OR  Informed Consent: I have reviewed the patients History and Physical, chart, labs and discussed the procedure including the risks, benefits and alternatives for the proposed anesthesia with the patient or authorized representative who has indicated his/her understanding and acceptance.     Dental Advisory Given  Plan Discussed with: CRNA and Surgeon  Anesthesia Plan Comments:        Anesthesia Quick  Evaluation

## 2023-07-24 NOTE — Discharge Instructions (Addendum)
You have a ureteral stent in place.  This is a tube that extends from your kidney to your bladder.  This may cause urinary bleeding, burning with urination, and urinary frequency.  Please call our office or present to the ED if you develop fevers >101 or pain which is not able to be controlled with oral pain medications.  You may be given either Flomax and/ or ditropan to help with bladder spasms and stent pain in addition to pain medications.    Your stent should stay until [facility] 08/04/23.  Please try your best to not dislodge the stent prematurely.  Use the bathroom, pat dry gently.  When showering, do the same.  Avoid sexual activity.  If your stent is dislodged prematurely, it is not an emergency but you can let us know nonurgently during office hours.  [facility] [address] [place], [place] [address] [phone]   AMBULATORY SURGERY  DISCHARGE INSTRUCTIONS   The drugs that you were given will stay in your system until tomorrow so for the next 24 hours you should not:  Drive an automobile Make any legal decisions Drink any alcoholic beverage   You may resume regular meals tomorrow.  Today it is better to start with liquids and gradually work up to solid foods.  You may eat anything you prefer, but it is better to start with liquids, then soup and crackers, and gradually work up to solid foods.   Please notify your doctor immediately if you have any unusual bleeding, trouble breathing, redness and pain at the surgery site, drainage, fever, or pain not relieved by medication.    Additional Instructions:        Please contact your physician with any problems or Same Day Surgery at [phone], Monday through Friday 6 am to 4 pm, or [place] at [facility] number at [phone].

## 2023-07-24 NOTE — Anesthesia Procedure Notes (Signed)
Procedure Name: Intubation Date/Time: 07/24/2023 12:33 PM  Performed by: Maryla Morrow., CRNAPre-anesthesia Checklist: Patient identified, Patient being monitored, Timeout performed, Emergency Drugs available and Suction available Patient Re-evaluated:Patient Re-evaluated prior to induction Oxygen Delivery Method: Circle system utilized Preoxygenation: Pre-oxygenation with 100% oxygen Induction Type: IV induction Ventilation: Mask ventilation without difficulty Laryngoscope Size: Mac and 3 Grade View: Grade I Tube type: Oral Tube size: 7.0 mm Number of attempts: 1 Airway Equipment and Method: Stylet Placement Confirmation: ETT inserted through vocal cords under direct vision, positive ETCO2 and breath sounds checked- equal and bilateral Secured at: 21 cm Tube secured with: Tape Dental Injury: Teeth and Oropharynx as per pre-operative assessment

## 2023-07-24 NOTE — Op Note (Signed)
Date of procedure: 07/24/23  Preoperative diagnosis:  1.5 cm right renal pelvic stone  Postoperative diagnosis:  Same as above  Procedure: Right ureteroscopy Laser lithotripsy Right retrograde pyelogram Right ureteral stent placement Interpretation of fluoroscopy less than 30 minutes  Surgeon: Vanna Scotland, MD  Anesthesia: General  Complications: None  Intraoperative findings: Significant renal pelvic edema with very large but relatively soft stone.  This was obliterated into fine dust but a large amount of dust fragments.  EBL: Minimal  Drains: 6 x 22 French double-J ureteral stent on right with tether  Specimens: None  Indication: Barbara Cole is a 32 y.o. patient with chronic intermittently obstructing right UPJ stone.  After reviewing the management options for treatment, she elected to proceed with the above surgical procedure(s). We have discussed the potential benefits and risks of the procedure, side effects of the proposed treatment, the likelihood of the patient achieving the goals of the procedure, and any potential problems that might occur during the procedure or recuperation. Informed consent has been obtained.  Description of procedure:  The patient was taken to the operating room and general anesthesia was induced.  The patient was placed in the dorsal lithotomy position, prepped and draped in the usual sterile fashion, and preoperative antibiotics were administered. A preoperative time-out was performed.   A 21 French cystoscope was advanced per urethra into the bladder.  Attention was turned to the right kidney.  Scout imaging showed a very large stone in the renal pelvis.  A sensor wire was then placed up to the level of the kidney.  A dual-lumen access sheath was then used to introduce a Super Stiff wire uses a working wire up to the level of the kidney as well.  The safety wire was snapped in place.  A 12/14 French ureteral access sheath was advanced to  the proximal ureter which went easily over the Super Stiff wire.  The inner cannula and Super Stiff were removed.  The dual channel digital flexible ureteroscope was then brought in and advanced to the level of the stone.  Notably, there was a good amount of edema at the renal pelvis as well as distally at the level of the stone.  At 3 56 m laser fiber was then brought in and using dusting settings of 0.3 J and 50 Hz, then again to slowly fragment the stone.  It was relatively soft but very large and in some locations, that it did not mucosal edema.  As she has several of the fragments into various calyces and try to fragment this as best as possible.  There is a very large amount of stone burden and dust material.  I tried obliterate all stones such that they were no greater than slightly larger than the tip of the laser fiber.  Final scout imaging showed no significant residual stone burden.  I then injected contrast through the create a roadmap of the collecting system.  There was no contrast extravasation.  Each and every calyx was directly visualized.  I then backed the scope down the length the ureter inspecting the ureter on the longer way.  There was a small amount of dust material but no significant fragments or ureteral injuries appreciated.  A 6 x 22 French double-J ureteral stent was then advanced over the wire up to the level of the kidney.  Once the wire was withdrawn, full coil was noted within the renal pelvis as well as within the bladder.  The bladder was drained.  The stent string was left affixed to the distal coil which was secured to the patient's right inner thigh using Mastisol and Tegaderm.  She was then cleaned and dried, repositioned in supine position, reversed from anesthesia, and taken to PACU in stable condition.  Plan: She declined narcotics.  She was discharged with oxybutynin and Flomax and will take NSAIDs as needed.  I would like her to keep her stent for at least 10 days due  to the amount of edema as well as fragments.  However follow back up in about a month after the stent is removed with a renal ultrasound.  Vanna Scotland, M.D.

## 2023-07-24 NOTE — Anesthesia Postprocedure Evaluation (Signed)
Anesthesia Post Note  Patient: Barbara Cole  Procedure(s) Performed: CYSTOSCOPY/URETEROSCOPY/HOLMIUM LASER/STENT PLACEMENT (Right: Urethra)  Patient location during evaluation: PACU Anesthesia Type: General Level of consciousness: awake Pain management: pain level controlled Vital Signs Assessment: post-procedure vital signs reviewed and stable Respiratory status: spontaneous breathing and nonlabored ventilation Cardiovascular status: stable Anesthetic complications: no   There were no known notable events for this encounter.   Last Vitals:  Vitals:   07/24/23 1400 07/24/23 1415  BP: (!) 140/88 132/68  Pulse:  75  Resp:  15  Temp:    SpO2:  95%    Last Pain:  Vitals:   07/24/23 1358  TempSrc:   PainSc: Asleep                 VAN STAVEREN,Alby Schwabe

## 2023-07-24 NOTE — H&P (Signed)
07/24/23  [facility] CTAB  Barbara Cole [birth year] [id number]   Referring provider: Kandyce Rud, MD [phone] [address] [facility] [place],  [place] [address]      Chief Complaint  Patient presents with   Follow-up      HPI: 32 year-old female with a personal history of nephrolithiasis returns today for a follow-up to discuss management of her renal cysts and stones.   She was initially found to have a large, approximately 1.5 cm, renal pelvic stone. A year ago she was scheduled for a lithotripsy but became pregnant and had, presumably an uncomplicated pregnancy. She also has known renal cysts of varying size and complexity. She is following up with the renal protocol MRI today. She has multiple cysts left greater than right, including a Bosniak 18F renal cyst with enhancing septation in the inferior pole. Remains stable since 2020 with no changes.    She reports not having problems with the stone during pregnancy. It is not bothering her now, the hematuria is gone. She wonders if it came out when she gave birth to her daughter, it didn't. She is going through a lot personally and thinks she hasn't healed fully from pregnancy so she is wondering how long before she needs to have it done.   PMH:     Past Medical History:  Diagnosis Date   Anal fissure     Anxiety     Depression     Headache      MIGRAINES   History of kidney stones 07/2019    right kidney stone currently   Stomach ulcer            Surgical History:      Past Surgical History:  Procedure Laterality Date   CHOLECYSTECTOMY N/A 08/25/2019    Procedure: LAPAROSCOPIC CHOLECYSTECTOMY;  Surgeon: Carolan Shiver, MD;  Location: [facility] ORS;  Service: General;  Laterality: N/A;   COLONOSCOPY   AGE 73   CYSTOSCOPY W/ RETROGRADES Bilateral 02/19/2021    Procedure: CYSTOSCOPY WITH RETROGRADE PYELOGRAM;  Surgeon: Vanna Scotland, MD;  Location: [facility] ORS;  Service: Urology;   Laterality: Bilateral;   CYSTOSCOPY/URETEROSCOPY/HOLMIUM LASER/STENT PLACEMENT Right 02/19/2021    Procedure: CYSTOSCOPY/URETEROSCOPY/HOLMIUM LASER/STENT PLACEMENT;  Surgeon: Vanna Scotland, MD;  Location: [facility] ORS;  Service: Urology;  Laterality: Right;   ESOPHAGOGASTRODUODENOSCOPY (EGD) WITH PROPOFOL N/A 07/12/2019    Procedure: ESOPHAGOGASTRODUODENOSCOPY (EGD) WITH PROPOFOL;  Surgeon: Christena Deem, MD;  Location: [facility] ENDOSCOPY;  Service: Endoscopy;  Laterality: N/A;   WISDOM TOOTH EXTRACTION              Home Medications:  Allergies as of 05/09/2023         Reactions    Antacid Medicine [traumeel] Other (See Comments)    High doses of antacids were causing dizziness.  Not sure if she still has a stomach ulcer so her MD d/c'd these meds    Hydrocodone Rash    Was listed as anaphylaxis but that was incorrect per pt report.            Medication List           Accurate as of May 09, 2023  8:59 AM. If you have any questions, ask your nurse or doctor.              STOP taking these medications     acetaminophen 325 MG tablet Commonly known as: Tylenol    busPIRone 5 MG tablet Commonly known as:  BUSPAR    coconut oil Oil    dibucaine 1 % Oint Commonly known as: NUPERCAINAL    docusate sodium 100 MG capsule Commonly known as: COLACE    ibuprofen 600 MG tablet Commonly known as: ADVIL    PRENATAL VITAMIN PO    sertraline 50 MG tablet Commonly known as: Zoloft    witch hazel-glycerin pad Commonly known as: TUCKS           TAKE these medications     valACYclovir 1000 MG tablet Commonly known as: VALTREX Take 1 tablet (1,000 mg total) by mouth 2 (two) times daily for 10 days.             Allergies:  Allergies       Allergies  Allergen Reactions   Antacid Medicine [Traumeel] Other (See Comments)      High doses of antacids were causing dizziness.  Not sure if she still has a stomach ulcer so her MD d/c'd these meds   Hydrocodone Rash       Was listed as anaphylaxis but that was incorrect per pt report.        Family History:      Family History  Problem Relation Age of Onset   Diabetes Maternal Grandfather     Cancer Maternal Grandfather     Pancreatic cancer Maternal Grandfather     Crohn's disease Maternal Grandfather     Colon cancer Neg Hx            Social History:  reports that she has never smoked. She has never used smokeless tobacco. She reports that she does not currently use alcohol. She reports that she does not use drugs.     Physical Exam: BP (!) 146/100   Ht 5\' 2"  (1.575 m)   Wt 204 lb (92.5 kg)   BMI 37.31 kg/m   Constitutional:  Alert and oriented, No acute distress. HEENT: [place] AT, moist mucus membranes.  Trachea midline, no masses. Neurologic: Grossly intact, no focal deficits, moving all 4 extremities. Psychiatric: Normal mood and affect.     Pertinent Imaging: CLINICAL DATA:  Follow-up renal cysts   EXAM: MRI ABDOMEN WITHOUT AND WITH CONTRAST   TECHNIQUE: Multiplanar multisequence MR imaging of the abdomen was performed both before and after the administration of intravenous contrast.   CONTRAST:  9 mL Vueway gadolinium contrast IV   COMPARISON:  CT abdomen pelvis, 04/17/2022, MR abdomen, 10/16/2020, 09/23/2019   FINDINGS: Lower chest: No acute abnormality.   Hepatobiliary: No solid liver abnormality is seen. Hepatic steatosis. Status post cholecystectomy. No gallstones, gallbladder wall thickening, or biliary dilatation.   Pancreas: Unremarkable. No pancreatic ductal dilatation or surrounding inflammatory changes.   Spleen: Normal in size without significant abnormality.   Adrenals/Urinary Tract: Adrenal glands are unremarkable. There is a 1.5 cm calculus in the right renal pelvis with mild associated hydronephrosis and peripelvic fat stranding (series 4, image 35). Additional nonobstructive calculus of the superior pole of the right kidney. Numerous bilateral renal  cortical cysts, the majority in the left kidney. In the inferior pole of the left kidney, there is a mildly heterogeneous, nonenhancing hemorrhagic or proteinaceous cyst, not significantly changed and benign (series 4, image 34). Additional thinly septated cysts of the superior pole of the left kidney (series 4, image 24) and anterior midportion (series 4, image 31), with some perceptible septal enhancement (series 14, image 63, 81). No left-sided hydronephrosis.   Stomach/Bowel: Stomach is within normal limits. No evidence of bowel wall  thickening, distention, or inflammatory changes.   Vascular/Lymphatic: No significant vascular findings are present. No enlarged abdominal lymph nodes.   Other: No abdominal wall hernia or abnormality. No ascites.   Musculoskeletal: No acute or significant osseous findings.   IMPRESSION: 1. There is a 1.5 cm calculus in the right renal pelvis with mild associated hydronephrosis and peripelvic fat stranding. 2. Additional nonobstructive calculus of the superior pole of the right kidney. 3. Numerous bilateral renal cortical cysts, the majority in the left kidney. In the inferior pole of the left kidney, there is a mildly heterogeneous, nonenhancing hemorrhagic or proteinaceous cyst, not significantly changed and benign (Bosniak category II). 4. Additional thinly septated cysts of the superior pole of the left kidney and anterior midportion, with some perceptible septal enhancement. These are consistent with Bosniak category IIF cysts but are without significant change in comparison to prior examinations dating back to 2020 and therefore can be considered benign with no specific further follow-up or characterization required. 5. Number and complexity of cysts in a patient of this age may reflect polycystic kidney disease. 6. Hepatic steatosis.   These results will be called to the ordering clinician or representative by the Radiologist Assistant,  and communication documented in the PACS or [employer].   Electronically Signed   By: Jearld Lesch M.D.   On: 04/29/2023 13:09   Personally reviewed today, compared with previous scans, and agree with radiologic interpretation.     Assessment & Plan:     Right renal pelvic stone   -  If left untreated it will continue to have inflammation and can start scarring can lead to kidney problems, hydronephrosis present  - We discussed various treatment options for urolithiasis including observation with or without medical expulsive therapy, shockwave lithotripsy (SWL), ureteroscopy and laser lithotripsy with stent placement, and percutaneous nephrolithotomy.   SWL has a lower stone free rate in a single procedure, but also a lower complication rate compared to ureteroscopy and avoids a stent and associated stent related symptoms. Possible complications include renal hematoma, steinstrasse, and need for additional treatment. We discussed the role of his increased skin to stone distance can lead to decreased efficacy with shockwave lithotripsy.   Ureteroscopy with laser lithotripsy and stent placement has a higher stone free rate than SWL in a single procedure, however increased complication rate including possible infection, ureteral injury, bleeding, and stent related morbidity. Common stent related symptoms include dysuria, urgency/frequency, and flank pain.    - Due to personal circumstances an aggressive approach is desired so as to take care of the stone completely in less time. She desires the ureteroscopy. She has had this before and struggled with the stent removal. But is willing to proceed since the failure rate is less than 10%. She is going to try to have it done within the next month.    Renal cysts  - Hasn't changed appearance in 4 years. Not polycystic kidney disease which she thought it was based on Mychart descriptions. Radiologist says it is benign. Plan to re-check it in 5  years with an ultrasound.   Schedule surgery   [facility] [address], [address] [place], [place] [address] [phone]

## 2023-07-24 NOTE — Transfer of Care (Signed)
Immediate Anesthesia Transfer of Care Note  Patient: Barbara Cole  Procedure(s) Performed: CYSTOSCOPY/URETEROSCOPY/HOLMIUM LASER/STENT PLACEMENT (Right: Urethra)  Patient Location: PACU  Anesthesia Type:General  Level of Consciousness: drowsy and patient cooperative  Airway & Oxygen Therapy: Patient Spontanous Breathing  Post-op Assessment: Report given to RN and Post -op Vital signs reviewed and stable  Post vital signs: stable  Last Vitals:  Vitals Value Taken Time  BP 137/75 07/24/23 1358  Temp 36.2 C 07/24/23 1358  Pulse 95 07/24/23 1400  Resp 16 07/24/23 1400  SpO2 99 % 07/24/23 1400  Vitals shown include unfiled device data.  Last Pain:  Vitals:   07/24/23 1358  TempSrc:   PainSc: Asleep         Complications: No notable events documented.

## 2023-07-25 ENCOUNTER — Encounter: Payer: Self-pay | Admitting: Urology

## 2023-07-26 ENCOUNTER — Emergency Department: Payer: 59

## 2023-07-26 ENCOUNTER — Other Ambulatory Visit: Payer: Self-pay

## 2023-07-26 ENCOUNTER — Emergency Department
Admission: EM | Admit: 2023-07-26 | Discharge: 2023-07-26 | Disposition: A | Discharge status: Home / Self Care | Payer: 59 | Attending: Emergency Medicine | Admitting: Emergency Medicine

## 2023-07-26 DIAGNOSIS — B9689 Other specified bacterial agents as the cause of diseases classified elsewhere: Secondary | ICD-10-CM | POA: Insufficient documentation

## 2023-07-26 DIAGNOSIS — N200 Calculus of kidney: Secondary | ICD-10-CM | POA: Diagnosis not present

## 2023-07-26 DIAGNOSIS — I878 Other specified disorders of veins: Secondary | ICD-10-CM | POA: Diagnosis not present

## 2023-07-26 DIAGNOSIS — R102 Pelvic and perineal pain: Secondary | ICD-10-CM | POA: Diagnosis not present

## 2023-07-26 DIAGNOSIS — G8918 Other acute postprocedural pain: Secondary | ICD-10-CM | POA: Insufficient documentation

## 2023-07-26 DIAGNOSIS — N390 Urinary tract infection, site not specified: Secondary | ICD-10-CM | POA: Insufficient documentation

## 2023-07-26 DIAGNOSIS — R109 Unspecified abdominal pain: Secondary | ICD-10-CM | POA: Diagnosis not present

## 2023-07-26 LAB — CBC
HCT: 41.9 % (ref 36.0–46.0)
Hemoglobin: 13.5 g/dL (ref 12.0–15.0)
MCH: 28 pg (ref 26.0–34.0)
MCHC: 32.2 g/dL (ref 30.0–36.0)
MCV: 86.7 fL (ref 80.0–100.0)
Platelets: 316 10*3/uL (ref 150–400)
RBC: 4.83 MIL/uL (ref 3.87–5.11)
RDW: 13.2 % (ref 11.5–15.5)
WBC: 13.8 10*3/uL — ABNORMAL HIGH (ref 4.0–10.5)
nRBC: 0 % (ref 0.0–0.2)

## 2023-07-26 LAB — COMPREHENSIVE METABOLIC PANEL
ALT: 22 U/L (ref 0–44)
AST: 17 U/L (ref 15–41)
Albumin: 4 g/dL (ref 3.5–5.0)
Alkaline Phosphatase: 59 U/L (ref 38–126)
Anion gap: 9 (ref 5–15)
BUN: 15 mg/dL (ref 6–20)
CO2: 23 mmol/L (ref 22–32)
Calcium: 8.7 mg/dL — ABNORMAL LOW (ref 8.9–10.3)
Chloride: 102 mmol/L (ref 98–111)
Creatinine, Ser: 0.88 mg/dL (ref 0.44–1.00)
GFR, Estimated: >60 mL/min (ref >60)
Glucose, Bld: 100 mg/dL — ABNORMAL HIGH (ref 70–99)
Potassium: 3.8 mmol/L (ref 3.5–5.1)
Sodium: 134 mmol/L — ABNORMAL LOW (ref 135–145)
Total Bilirubin: 1.1 mg/dL (ref 0.3–1.2)
Total Protein: 7.8 g/dL (ref 6.5–8.1)

## 2023-07-26 LAB — URINALYSIS, ROUTINE W REFLEX MICROSCOPIC
Bilirubin Urine: NEGATIVE
Glucose, UA: NEGATIVE mg/dL
Ketones, ur: NEGATIVE mg/dL
Nitrite: NEGATIVE
Protein, ur: 100 mg/dL — AB
Specific Gravity, Urine: 1.002 — ABNORMAL LOW (ref 1.005–1.030)
WBC, UA: >50 WBC/hpf (ref 0–5)
pH: 6 (ref 5.0–8.0)

## 2023-07-26 LAB — PREGNANCY, URINE: Preg Test, Ur: NEGATIVE

## 2023-07-26 LAB — LIPASE, BLOOD: Lipase: 35 U/L (ref 11–51)

## 2023-07-26 MED ORDER — KETOROLAC TROMETHAMINE 10 MG PO TABS: 10.0000 mg | ORAL_TABLET | Freq: Three times a day (TID) | ORAL | 0 refills | Status: DC | PRN

## 2023-07-26 MED ORDER — CEPHALEXIN 500 MG PO CAPS: 500.0000 mg | ORAL_CAPSULE | Freq: Four times a day (QID) | ORAL | 0 refills | Status: AC

## 2023-07-26 MED ORDER — KETOROLAC TROMETHAMINE 15 MG/ML IJ SOLN
15.0000 mg | Freq: Once | INTRAMUSCULAR | Status: AC
  Administered 2023-07-26: 15 mg via INTRAVENOUS
  Filled 2023-07-26: qty 1

## 2023-07-26 MED ORDER — SODIUM CHLORIDE 0.9 % IV SOLN
1.0000 g | Freq: Once | INTRAVENOUS | Status: AC
  Administered 2023-07-26: 1 g via INTRAVENOUS
  Filled 2023-07-26: qty 10

## 2023-07-26 MED ORDER — MORPHINE SULFATE (PF) 4 MG/ML IV SOLN
4.0000 mg | Freq: Once | INTRAVENOUS | Status: DC
  Filled 2023-07-26: qty 1

## 2023-07-26 MED ORDER — SODIUM CHLORIDE 0.9 % IV BOLUS
1000.0000 mL | Freq: Once | INTRAVENOUS | Status: AC
  Administered 2023-07-26: 1000 mL via INTRAVENOUS

## 2023-07-26 NOTE — Discharge Instructions (Signed)
Please seek medical attention for any high fevers, chest pain, shortness of breath, change in behavior, persistent vomiting, bloody stool or any other new or concerning symptoms.  

## 2023-07-26 NOTE — ED Notes (Signed)
Pt arrives via POV with s/o at bedside. Pt c/o right sided flank pain that radiates into the lower abdomen. Pain has been intermittent and progressively worse since surgical procedure on Thursday follow stent placement s/t kidney stone. Has taken ibuprofen for pain management that has helped, last taken 7:30 am. Pt currently report pain 10/10. Pt also sts concern for elevated temp at home, T 100 oral. Afebrile on arrival to ED. Endorses burning and pain with urination.

## 2023-07-26 NOTE — ED Triage Notes (Signed)
Pt to ed from home via POV for a post op complication. Pt had lithotrypsy rocedure on Thursday. Pt spiked fever tonight and is unable to get comfortable. Pt is caox4, and in obvious discomfort in triage. Pt unable to sit.

## 2023-07-26 NOTE — ED Notes (Signed)
Discharge instructions provided by edp were reinforced to pt. Pt verbalized understanding with no additional questions at this time. Pt to follow up with urology.   Pt pending discharge after completing IVF

## 2023-07-26 NOTE — ED Provider Notes (Signed)
[facility] Provider Note    Event Date/Time   First MD Initiated Contact with Patient 07/26/23 2102     (approximate)   History   Post-op Problem   HPI {Remember to add pertinent medical, surgical, social, and/or OB history to HPI:1} Barbara Cole is a 32 y.o. female  ***       Physical Exam   Triage Vital Signs: ED Triage Vitals  Encounter Vitals Group     BP 07/26/23 2024 (!) 159/102     Systolic BP Percentile --      Diastolic BP Percentile --      Pulse Rate 07/26/23 2024 (!) 115     Resp 07/26/23 2024 18     Temp 07/26/23 2024 98 F (36.7 C)     Temp Source 07/26/23 2024 Oral     SpO2 07/26/23 2024 99 %     Weight 07/26/23 2022 202 lb 13.2 oz (92 kg)     Height 07/26/23 2022 5\' 2"  (1.575 m)     Head Circumference --      Peak Flow --      Pain Score 07/26/23 2022 10     Pain Loc --      Pain Education --      Exclude from Growth Chart --     Most recent vital signs: Vitals:   07/26/23 2045 07/26/23 2100  BP:  (!) 140/84  Pulse: 97 86  Resp:    Temp:    SpO2: 97% 97%    {Only need to document appropriate and relevant physical exam:1} General: Awake, no distress. *** CV:  Good peripheral perfusion. *** Resp:  Normal effort. *** Abd:  No distention. *** Other:  ***   ED Results / Procedures / Treatments   Labs (all labs ordered are listed, but only abnormal results are displayed) Labs Reviewed  COMPREHENSIVE METABOLIC PANEL - Abnormal; Notable for the following components:      Result Value   Sodium 134 (*)    Glucose, Bld 100 (*)    Calcium 8.7 (*)    All other components within normal limits  CBC - Abnormal; Notable for the following components:   WBC 13.8 (*)    All other components within normal limits  URINALYSIS, ROUTINE W REFLEX MICROSCOPIC - Abnormal; Notable for the following components:   Color, Urine STRAW (*)    APPearance HAZY (*)    Specific Gravity, Urine 1.002 (*)    Hgb urine dipstick  LARGE (*)    Protein, ur 100 (*)    Leukocytes,Ua LARGE (*)    Bacteria, UA RARE (*)    All other components within normal limits  LIPASE, BLOOD  PREGNANCY, URINE     EKG  ***   RADIOLOGY *** {USE THE WORD "INTERPRETED"!! You MUST document your own interpretation of imaging, as well as the fact that you reviewed the radiologist's report!:1}   PROCEDURES:  Critical Care performed: Yes  CRITICAL CARE Performed by: Phineas Semen   Total critical care time: *** minutes  Critical care time was exclusive of separately billable procedures and treating other patients.  Critical care was necessary to treat or prevent imminent or life-threatening deterioration.  Critical care was time spent personally by me on the following activities: development of treatment plan with patient and/or surrogate as well as nursing, discussions with consultants, evaluation of patient's response to treatment, examination of patient, obtaining history from patient or surrogate, ordering and performing treatments and  interventions, ordering and review of laboratory studies, ordering and review of radiographic studies, pulse oximetry and re-evaluation of patient's condition.   Procedures    MEDICATIONS ORDERED IN ED: Medications - No data to display   IMPRESSION / MDM / ASSESSMENT AND PLAN / ED COURSE  I reviewed the triage vital signs and the nursing notes.                              Differential diagnosis includes, but is not limited to, ***  Patient's presentation is most consistent with {EM COPA:27473}   ***The patient is on the cardiac monitor to evaluate for evidence of arrhythmia and/or significant heart rate changes.  ***      FINAL CLINICAL IMPRESSION(S) / ED DIAGNOSES   Final diagnoses:  None     Rx / DC Orders   ED Discharge Orders     None        Note:  This document was prepared using Dragon voice recognition software and may include unintentional  dictation errors.

## 2023-07-26 NOTE — ED Notes (Signed)
Patient transported to X-ray 

## 2023-07-28 ENCOUNTER — Telehealth: Payer: Self-pay | Admitting: *Deleted

## 2023-07-28 LAB — URINE CULTURE: Culture: <10000 — AB

## 2023-07-28 NOTE — Telephone Encounter (Signed)
Patient called in this morning stating she is in a lot of pain . She would like to remove the stent herself. Note from Dr. Apolinar Junes states to remove the stent in 10 days (08/04/2023)  Patient is taking Flomax, oral Toradol and Antibiotics prescribed by by ED over the weekend, and stopped the oxybutynin due to rash in face . Talked with Pearletha Forge states for the patient to keep the stent in until Monday August 5th than remove stent. Patient to come by the office to get some gemtesa samples. Than a have one month office visit with Dr. Apolinar Junes . Patient to call the office if she has any more questions.

## 2023-07-29 DIAGNOSIS — F4323 Adjustment disorder with mixed anxiety and depressed mood: Secondary | ICD-10-CM | POA: Diagnosis not present

## 2023-08-01 ENCOUNTER — Telehealth: Payer: Self-pay

## 2023-08-01 NOTE — Telephone Encounter (Signed)
Transition Care Management Unsuccessful Follow-up Telephone Call  Date of discharge and from where:  [facility] 7/27  Attempts:  1st Attempt  Reason for unsuccessful TCM follow-up call:  No answer/busy   Lenard Forth [facility] Guide, [facility] [phone] [address] [place], [place], [place] [address] Phone: [phone] Email: [email or web address]

## 2023-08-01 NOTE — Telephone Encounter (Signed)
Transition Care Management Unsuccessful Follow-up Telephone Call  Date of discharge and from where:  [facility] 7/27  Attempts:  2nd Attempt  Reason for unsuccessful TCM follow-up call:  No answer/busy   Lenard Forth [facility] Guide, [facility] [phone] [address] [place], [place], [place] [address] Phone: [phone] Email: [email or web address]

## 2023-08-08 DIAGNOSIS — Z79899 Other long term (current) drug therapy: Secondary | ICD-10-CM | POA: Diagnosis not present

## 2023-08-08 DIAGNOSIS — Z13220 Encounter for screening for lipoid disorders: Secondary | ICD-10-CM | POA: Diagnosis not present

## 2023-08-12 ENCOUNTER — Encounter: Payer: 59 | Admitting: Urology

## 2023-08-15 ENCOUNTER — Other Ambulatory Visit: Payer: Self-pay | Admitting: Urology

## 2023-08-15 ENCOUNTER — Encounter: Payer: Self-pay | Admitting: Urology

## 2023-08-18 ENCOUNTER — Other Ambulatory Visit: Payer: Self-pay

## 2023-08-18 DIAGNOSIS — N200 Calculus of kidney: Secondary | ICD-10-CM

## 2023-08-18 DIAGNOSIS — N201 Calculus of ureter: Secondary | ICD-10-CM

## 2023-08-22 DIAGNOSIS — F419 Anxiety disorder, unspecified: Secondary | ICD-10-CM | POA: Diagnosis not present

## 2023-08-22 DIAGNOSIS — Z0000 Encounter for general adult medical examination without abnormal findings: Secondary | ICD-10-CM | POA: Diagnosis not present

## 2023-09-02 ENCOUNTER — Encounter: Payer: Self-pay | Admitting: Urology

## 2023-09-03 ENCOUNTER — Encounter: Payer: 59 | Admitting: Urology

## 2023-09-24 ENCOUNTER — Encounter: Payer: Self-pay | Admitting: Urology

## 2023-09-30 DIAGNOSIS — F4323 Adjustment disorder with mixed anxiety and depressed mood: Secondary | ICD-10-CM | POA: Diagnosis not present

## 2024-05-19 ENCOUNTER — Observation Stay
Admission: EM | Admit: 2024-05-19 | Discharge: 2024-05-20 | Disposition: A | Discharge status: Home / Self Care | Attending: Internal Medicine | Admitting: Internal Medicine

## 2024-05-19 ENCOUNTER — Other Ambulatory Visit: Payer: Self-pay

## 2024-05-19 ENCOUNTER — Encounter: Payer: Self-pay | Admitting: Emergency Medicine

## 2024-05-19 ENCOUNTER — Emergency Department

## 2024-05-19 DIAGNOSIS — R110 Nausea: Secondary | ICD-10-CM | POA: Diagnosis not present

## 2024-05-19 DIAGNOSIS — N200 Calculus of kidney: Secondary | ICD-10-CM | POA: Diagnosis not present

## 2024-05-19 DIAGNOSIS — Z79899 Other long term (current) drug therapy: Secondary | ICD-10-CM | POA: Insufficient documentation

## 2024-05-19 DIAGNOSIS — R109 Unspecified abdominal pain: Secondary | ICD-10-CM | POA: Diagnosis present

## 2024-05-19 DIAGNOSIS — N202 Calculus of kidney with calculus of ureter: Principal | ICD-10-CM | POA: Insufficient documentation

## 2024-05-19 DIAGNOSIS — R52 Pain, unspecified: Secondary | ICD-10-CM

## 2024-05-19 DIAGNOSIS — N390 Urinary tract infection, site not specified: Secondary | ICD-10-CM | POA: Diagnosis not present

## 2024-05-19 LAB — URINALYSIS, ROUTINE W REFLEX MICROSCOPIC
Glucose, UA: NEGATIVE mg/dL
RBC / HPF: >50 RBC/hpf (ref 0–5)
WBC, UA: >50 WBC/hpf (ref 0–5)

## 2024-05-19 LAB — BASIC METABOLIC PANEL WITH GFR
Anion gap: 9 (ref 5–15)
BUN: 14 mg/dL (ref 6–20)
CO2: 24 mmol/L (ref 22–32)
Calcium: 9.3 mg/dL (ref 8.9–10.3)
Chloride: 106 mmol/L (ref 98–111)
Creatinine, Ser: 0.82 mg/dL (ref 0.44–1.00)
GFR, Estimated: >60 mL/min (ref >60)
Glucose, Bld: 99 mg/dL (ref 70–99)
Potassium: 3.7 mmol/L (ref 3.5–5.1)
Sodium: 139 mmol/L (ref 135–145)

## 2024-05-19 LAB — POC URINE PREG, ED: Preg Test, Ur: NEGATIVE

## 2024-05-19 LAB — CBC
HCT: 42.5 % (ref 36.0–46.0)
Hemoglobin: 14.4 g/dL (ref 12.0–15.0)
MCH: 29.7 pg (ref 26.0–34.0)
MCHC: 33.9 g/dL (ref 30.0–36.0)
MCV: 87.6 fL (ref 80.0–100.0)
Platelets: 315 10*3/uL (ref 150–400)
RBC: 4.85 MIL/uL (ref 3.87–5.11)
RDW: 12.4 % (ref 11.5–15.5)
WBC: 9.2 10*3/uL (ref 4.0–10.5)
nRBC: 0 % (ref 0.0–0.2)

## 2024-05-19 MED ORDER — CEFAZOLIN SODIUM-DEXTROSE 1-4 GM/50ML-% IV SOLN: 1.0000 g | Freq: Once | INTRAVENOUS | Status: DC

## 2024-05-19 MED ORDER — MORPHINE SULFATE (PF) 2 MG/ML IV SOLN: 1.0000 mg | Freq: Four times a day (QID) | INTRAVENOUS | Status: DC | PRN

## 2024-05-19 MED ORDER — SODIUM CHLORIDE 0.9 % IV SOLN: 1.0000 g | Freq: Once | INTRAVENOUS | Status: DC

## 2024-05-19 MED ORDER — SODIUM CHLORIDE 0.9 % IV BOLUS
1000.0000 mL | Freq: Once | INTRAVENOUS | Status: AC
  Administered 2024-05-19: 1000 mL via INTRAVENOUS

## 2024-05-19 MED ORDER — ALBUTEROL SULFATE (2.5 MG/3ML) 0.083% IN NEBU: 2.5000 mg | INHALATION_SOLUTION | Freq: Four times a day (QID) | RESPIRATORY_TRACT | Status: DC | PRN

## 2024-05-19 MED ORDER — HYDROMORPHONE HCL 1 MG/ML IJ SOLN
1.0000 mg | Freq: Once | INTRAMUSCULAR | Status: AC
  Administered 2024-05-19: 1 mg via INTRAVENOUS
  Filled 2024-05-19: qty 1

## 2024-05-19 MED ORDER — ONDANSETRON HCL 4 MG/2ML IJ SOLN
4.0000 mg | Freq: Once | INTRAMUSCULAR | Status: AC
  Administered 2024-05-19: 4 mg via INTRAVENOUS
  Filled 2024-05-19: qty 2

## 2024-05-19 MED ORDER — ACETAMINOPHEN 650 MG RE SUPP: 650.0000 mg | Freq: Four times a day (QID) | RECTAL | Status: DC | PRN

## 2024-05-19 MED ORDER — SODIUM CHLORIDE 0.9% FLUSH
3.0000 mL | Freq: Two times a day (BID) | INTRAVENOUS | Status: DC
  Administered 2024-05-19: 3 mL via INTRAVENOUS

## 2024-05-19 MED ORDER — SENNOSIDES-DOCUSATE SODIUM 8.6-50 MG PO TABS: 1.0000 | ORAL_TABLET | Freq: Every evening | ORAL | Status: DC | PRN

## 2024-05-19 MED ORDER — ACETAMINOPHEN 325 MG PO TABS
650.0000 mg | ORAL_TABLET | Freq: Four times a day (QID) | ORAL | Status: DC | PRN
  Administered 2024-05-20: 650 mg via ORAL
  Filled 2024-05-19: qty 2

## 2024-05-19 MED ORDER — TAMSULOSIN HCL 0.4 MG PO CAPS
0.4000 mg | ORAL_CAPSULE | Freq: Once | ORAL | Status: AC
  Administered 2024-05-19: 0.4 mg via ORAL
  Filled 2024-05-19: qty 1

## 2024-05-19 MED ORDER — BISACODYL 5 MG PO TBEC: 5.0000 mg | DELAYED_RELEASE_TABLET | Freq: Every day | ORAL | Status: DC | PRN

## 2024-05-19 MED ORDER — SODIUM CHLORIDE 0.9 % IV SOLN
1.0000 g | Freq: Once | INTRAVENOUS | Status: AC
  Administered 2024-05-19: 1 g via INTRAVENOUS
  Filled 2024-05-19: qty 10

## 2024-05-19 MED ORDER — SERTRALINE HCL 50 MG PO TABS
50.0000 mg | ORAL_TABLET | Freq: Every day | ORAL | Status: DC
  Filled 2024-05-19 (×2): qty 1

## 2024-05-19 MED ORDER — KETOROLAC TROMETHAMINE 30 MG/ML IJ SOLN
15.0000 mg | Freq: Once | INTRAMUSCULAR | Status: AC
  Administered 2024-05-19: 15 mg via INTRAVENOUS
  Filled 2024-05-19: qty 1

## 2024-05-19 MED ORDER — KETOROLAC TROMETHAMINE 15 MG/ML IJ SOLN
15.0000 mg | Freq: Once | INTRAMUSCULAR | Status: AC
  Administered 2024-05-19: 15 mg via INTRAVENOUS
  Filled 2024-05-19: qty 1

## 2024-05-19 MED ORDER — SODIUM CHLORIDE 0.9 % IV SOLN: INTRAVENOUS | Status: DC

## 2024-05-19 MED ORDER — KETOROLAC TROMETHAMINE 15 MG/ML IJ SOLN: 15.0000 mg | Freq: Four times a day (QID) | INTRAMUSCULAR | Status: DC | PRN

## 2024-05-19 MED ORDER — ONDANSETRON HCL 4 MG/2ML IJ SOLN: 4.0000 mg | Freq: Four times a day (QID) | INTRAMUSCULAR | Status: DC | PRN

## 2024-05-19 MED ORDER — TRAZODONE HCL 50 MG PO TABS: 25.0000 mg | ORAL_TABLET | Freq: Every evening | ORAL | Status: DC | PRN

## 2024-05-19 MED ORDER — FENTANYL CITRATE PF 50 MCG/ML IJ SOSY
25.0000 ug | PREFILLED_SYRINGE | Freq: Once | INTRAMUSCULAR | Status: AC
  Administered 2024-05-19: 25 ug via INTRAVENOUS
  Filled 2024-05-19: qty 1

## 2024-05-19 MED ORDER — ONDANSETRON HCL 4 MG PO TABS: 4.0000 mg | ORAL_TABLET | Freq: Four times a day (QID) | ORAL | Status: DC | PRN

## 2024-05-19 MED ORDER — TAMSULOSIN HCL 0.4 MG PO CAPS
0.4000 mg | ORAL_CAPSULE | Freq: Every day | ORAL | Status: DC
  Administered 2024-05-20: 0.4 mg via ORAL
  Filled 2024-05-19 (×2): qty 1

## 2024-05-19 NOTE — H&P (Signed)
 History and Physical   [facility] -  @ [facility] Admission History and Physical [employer], D.O.    Patient Name: Barbara Cole MR#: [id number] Date of Birth: 06-23-1991 Date of Admission: 05/19/2024  Referring MD/NP/PA: Chrystie Crass Primary Care Physician: Nestor Banter, MD  Chief Complaint:  Chief Complaint  Patient presents with   Flank Pain    HPI: Barbara Cole is a 33 y.o. female with a known history of anxiety, depression, gastritis, migraines, kidney stones and peptic ulcer disease presents to the emergency department for evaluation of left-sided flank pain and hematuria.  Patient was in a usual state of health until today she was seen at her doctor's office and prescribed antibiotic and Flomax  for presumed kidney stone however show pain persisted and she presented to the emergency department today complaining of left flank pain that radiates around to the abdomen.  Patient has a history of kidney stones in the past.  Patient denies fevers/chills, weakness, dizziness, chest pain, shortness of breath, N/V/C/D, abdominal pain, dysuria/frequency, changes in mental status.    Otherwise there has been no change in status. Patient has been taking medication as prescribed and there has been no recent change in medication or diet.  No recent antibiotics.  There has been no recent illness, hospitalizations, travel or sick contacts.    EMS/ED Course: Patient received fentanyl , hydromorphone, Toradol , Rocephin , Zofran , Flomax  and normal saline. Medical admission has been requested for further management of 2 mm left to you UVJ stone, intractable nausea, intractable pain.    Assessment/Plan  This is a 33 y.o. female with a history of anxiety, depression, gastritis, migraines, kidney stones and peptic ulcer disease now being admitted with:  #.  Nonobstructive 2 mm left UVJ stone with urinary tract  Infection -Admit observation - Continue IV fluids -  Continue tamsulosin  - Emergency department provider did discuss case with urology -Continue Rocephin  - Strain all urine  #.  History of anxiety and depression - Continue sertraline   Admission status:'Obs IV Fluids: Normal saline Diet/Nutrition: Regular Consults called: EDP discussed with urology DVT Px: SCDs and early ambulation. Code Status: Full Code  Disposition Plan: To home in less than 24 hours  All the records are reviewed and case discussed with ED provider. Management plans discussed with the patient and/or family who express understanding and agree with plan of care.  Review of Systems:  CONSTITUTIONAL: No fever/chills, fatigue, weakness, weight gain/loss, headache. EYES: No blurry or double vision. ENT: No tinnitus, postnasal drip, redness or soreness of the oropharynx. RESPIRATORY: No cough, dyspnea, wheeze.  No hemoptysis.  CARDIOVASCULAR: No chest pain, palpitations, syncope, orthopnea. No lower extremity edema.  GASTROINTESTINAL: No nausea, vomiting, abdominal pain, diarrhea, constipation.  No hematemesis, melena or hematochezia. GENITOURINARY: Positive hematuria no dysuria, frequency, ENDOCRINE: No polyuria or nocturia. No heat or cold intolerance. HEMATOLOGY: No anemia, bruising, bleeding. INTEGUMENTARY: No rashes, ulcers, lesions. MUSCULOSKELETAL: Positive left back pain no arthritis, gout. NEUROLOGIC: No numbness, tingling, ataxia, seizure-type activity, weakness. PSYCHIATRIC: No anxiety, depression, insomnia.   Past Medical History:  Diagnosis Date   Anal fissure    Anxiety    Depression    Gastritis    Headache    MIGRAINES   History of kidney stones 07/2019   Obstruction of right ureteropelvic junction due to stone 06/2023   Renal cyst, left    Stomach ulcer     Past Surgical History:  Procedure Laterality Date   CHOLECYSTECTOMY N/A 08/25/2019   Procedure: LAPAROSCOPIC CHOLECYSTECTOMY;  Surgeon: Dortha Gauss,  Elmo Haff, MD;  Location: [facility] ORS;   Service: General;  Laterality: N/A;   COLONOSCOPY  AGE 58   CYSTOSCOPY W/ RETROGRADES Bilateral 02/19/2021   Procedure: CYSTOSCOPY WITH RETROGRADE PYELOGRAM;  Surgeon: Dustin Gimenez, MD;  Location: [facility] ORS;  Service: Urology;  Laterality: Bilateral;   CYSTOSCOPY W/ RETROGRADES  07/24/2023   Procedure: CYSTOSCOPY WITH RETROGRADE PYELOGRAM;  Surgeon: Dustin Gimenez, MD;  Location: [facility] ORS;  Service: Urology;;   CYSTOSCOPY/URETEROSCOPY/HOLMIUM LASER/STENT PLACEMENT Right 02/19/2021   Procedure: CYSTOSCOPY/URETEROSCOPY/HOLMIUM LASER/STENT PLACEMENT;  Surgeon: Dustin Gimenez, MD;  Location: [facility] ORS;  Service: Urology;  Laterality: Right;   CYSTOSCOPY/URETEROSCOPY/HOLMIUM LASER/STENT PLACEMENT Right 07/24/2023   Procedure: CYSTOSCOPY/URETEROSCOPY/HOLMIUM LASER/STENT PLACEMENT;  Surgeon: Dustin Gimenez, MD;  Location: [facility] ORS;  Service: Urology;  Laterality: Right;   ESOPHAGOGASTRODUODENOSCOPY (EGD) WITH PROPOFOL  N/A 07/12/2019   Procedure: ESOPHAGOGASTRODUODENOSCOPY (EGD) WITH PROPOFOL ;  Surgeon: Deveron Fly, MD;  Location: [facility] ENDOSCOPY;  Service: Endoscopy;  Laterality: N/A;   WISDOM TOOTH EXTRACTION       reports that she has never smoked. She has never used smokeless tobacco. She reports current alcohol use. She reports that she does not use drugs.  Allergies  Allergen Reactions   Antacid Medicine [Traumeel] Other (See Comments)    High doses of antacids were causing dizziness.  Not sure if she still has a stomach ulcer so her MD d/c'd these meds   Hydrocodone  Rash    Was listed as anaphylaxis but that was incorrect per pt report.    Family History  Problem Relation Age of Onset   Diabetes Maternal Grandfather    Cancer Maternal Grandfather    Pancreatic cancer Maternal Grandfather    Crohn's disease Maternal Grandfather    Colon cancer Neg Hx     Prior to Admission medications   Medication Sig Start Date End Date Taking? Authorizing Provider  ciprofloxacin  (CIPRO ) 250  MG tablet Take 250 mg by mouth 2 (two) times daily. 05/19/24 05/24/24 Yes [provider]  ketorolac  (TORADOL ) 10 MG tablet Take 1 tablet (10 mg total) by mouth every 8 (eight) hours as needed for severe pain. 07/26/23  Yes Marylynn Soho, MD  oxybutynin  (DITROPAN ) 5 MG tablet Take 1 tablet (5 mg total) by mouth every 8 (eight) hours as needed for bladder spasms. 07/24/23  Yes Dustin Gimenez, MD  sertraline  (ZOLOFT ) 50 MG tablet Take 75 mg by mouth daily. 08/22/23 08/21/24 Yes [provider]  tamsulosin  (FLOMAX ) 0.4 MG CAPS capsule Take 1 capsule (0.4 mg total) by mouth daily. 07/24/23   Dustin Gimenez, MD  valACYclovir  (VALTREX ) 1000 MG tablet Take 1 tablet (1,000 mg total) by mouth 2 (two) times daily for 10 days. Patient taking differently: Take 1,000 mg by mouth as needed. 02/17/23       Physical Exam: Vitals:   05/19/24 1400 05/19/24 1430 05/19/24 1500 05/19/24 1534  BP: 135/82 126/79 127/81   Pulse: 83 85 81   Resp: 17 16 18    Temp:    98.1 F (36.7 C)  TempSrc:    Oral  SpO2: 99% 92% 94%   Weight:      Height:        GENERAL: 33 y.o.-year-old female patient, well-developed, well-nourished lying in the bed in no acute distress.  Pleasant and cooperative.   HEENT: Head atraumatic, normocephalic. Pupils equal. Mucus membranes moist. NECK: Supple. No JVD. CHEST: Normal breath sounds bilaterally. No wheezing, rales, rhonchi or crackles. No use of accessory muscles of respiration.  No reproducible chest wall tenderness.  CARDIOVASCULAR: S1, S2 normal. No murmurs, rubs, or gallops. Cap refill <2 seconds. Pulses intact distally.  ABDOMEN: Soft, nondistended, nontender. No rebound, guarding, rigidity. Normoactive bowel sounds present in all four quadrants.  Positive left CVA tenderness EXTREMITIES: No pedal edema, cyanosis, or clubbing. No calf tenderness or Homan's sign.  NEUROLOGIC: The patient is alert and oriented x 3. Cranial nerves II through XII are grossly intact  with no focal sensorimotor deficit. PSYCHIATRIC:  Normal affect, mood, thought content. SKIN: Warm, dry, and intact without obvious rash, lesion, or ulcer.    Labs on Admission:  CBC: Recent Labs  Lab 05/19/24 1210  WBC 9.2  HGB 14.4  HCT 42.5  MCV 87.6  PLT 315   Basic Metabolic Panel: Recent Labs  Lab 05/19/24 1210  NA 139  K 3.7  CL 106  CO2 24  GLUCOSE 99  BUN 14  CREATININE 0.82  CALCIUM 9.3   GFR: Estimated Creatinine Clearance: 105.5 mL/min (by C-G formula based on SCr of 0.82 mg/dL). Liver Function Tests: No results for input(s): "AST", "ALT", "ALKPHOS", "BILITOT", "PROT", "ALBUMIN" in the last 168 hours. No results for input(s): "LIPASE", "AMYLASE" in the last 168 hours. No results for input(s): "AMMONIA" in the last 168 hours. Coagulation Profile: No results for input(s): "INR", "PROTIME" in the last 168 hours. Cardiac Enzymes: No results for input(s): "CKTOTAL", "CKMB", "CKMBINDEX", "TROPONINI" in the last 168 hours. BNP (last 3 results) No results for input(s): "PROBNP" in the last 8760 hours. HbA1C: No results for input(s): "HGBA1C" in the last 72 hours. CBG: No results for input(s): "GLUCAP" in the last 168 hours. Lipid Profile: No results for input(s): "CHOL", "HDL", "LDLCALC", "TRIG", "CHOLHDL", "LDLDIRECT" in the last 72 hours. Thyroid  Function Tests: No results for input(s): "TSH", "T4TOTAL", "FREET4", "T3FREE", "THYROIDAB" in the last 72 hours. Anemia Panel: No results for input(s): "VITAMINB12", "FOLATE", "FERRITIN", "TIBC", "IRON", "RETICCTPCT" in the last 72 hours. Urine analysis:    Component Value Date/Time   COLORURINE RED (A) 05/19/2024 1206   APPEARANCEUR CLOUDY (A) 05/19/2024 1206   APPEARANCEUR Hazy (A) 07/14/2023 1303   LABSPEC  05/19/2024 1206    TEST NOT REPORTED DUE TO COLOR INTERFERENCE OF URINE PIGMENT   PHURINE  05/19/2024 1206    TEST NOT REPORTED DUE TO COLOR INTERFERENCE OF URINE PIGMENT   GLUCOSEU NEGATIVE  05/19/2024 1206   HGBUR (A) 05/19/2024 1206    TEST NOT REPORTED DUE TO COLOR INTERFERENCE OF URINE PIGMENT   BILIRUBINUR (A) 05/19/2024 1206    TEST NOT REPORTED DUE TO COLOR INTERFERENCE OF URINE PIGMENT   BILIRUBINUR Negative 07/14/2023 1303   KETONESUR (A) 05/19/2024 1206    TEST NOT REPORTED DUE TO COLOR INTERFERENCE OF URINE PIGMENT   PROTEINUR (A) 05/19/2024 1206    TEST NOT REPORTED DUE TO COLOR INTERFERENCE OF URINE PIGMENT   NITRITE (A) 05/19/2024 1206    TEST NOT REPORTED DUE TO COLOR INTERFERENCE OF URINE PIGMENT   LEUKOCYTESUR (A) 05/19/2024 1206    TEST NOT REPORTED DUE TO COLOR INTERFERENCE OF URINE PIGMENT   Sepsis Labs: @LABRCNTIP (procalcitonin:4,lacticidven:4) )No results found for this or any previous visit (from the past 240 hours).   Radiological Exams on Admission: CT Renal Stone Study Result Date: 05/19/2024 CLINICAL DATA:  Abdominal/flank pain, stone suspected. Left-sided flank pain. EXAM: CT ABDOMEN AND PELVIS WITHOUT CONTRAST TECHNIQUE: Multidetector CT imaging of the abdomen and pelvis was performed following the standard protocol without IV contrast. RADIATION DOSE REDUCTION: This exam was performed according to the departmental  dose-optimization program which includes automated exposure control, adjustment of the mA and/or kV according to patient size and/or use of iterative reconstruction technique. COMPARISON:  CT scan renal stone protocol from 04/17/2022. FINDINGS: Lower chest: The lung bases are clear. No pleural effusion. The heart is normal in size. No pericardial effusion. Hepatobiliary: The liver is normal in size. Non-cirrhotic configuration. No suspicious mass. These is mild diffuse hepatic steatosis. no intrahepatic or extrahepatic bile duct dilation. Gallbladder is surgically absent. Pancreas: Unremarkable. No pancreatic ductal dilatation or surrounding inflammatory changes. Spleen: Within normal limits. No focal lesion. Adrenals/Urinary Tract: Adrenal  glands are unremarkable. No suspicious renal mass within the limitations of this unenhanced exam. Redemonstration of is slightly hyperattenuating ill-defined structure in the left kidney lower pole, incompletely characterized on the current exam but unchanged since the prior study from 04/17/2022 favoring proteinaceous/hemorrhagic cyst. There are 4-5 nonobstructing calculi in the right renal pelvis/extension into the right lower pole calyx with largest measuring up to 9 x 15 mm. No right hydroureteronephrosis or ureterolithiasis. There is a 2 x 3 mm left vesicoureteric junction calculus with mild proximal obstructive uropathy. No other left nephroureterolithiasis. Urinary bladder is under distended, precluding optimal assessment. However, no large mass identified. No perivesical fat stranding. Stomach/Bowel: Unremarkable appendix. There is short segment of proximal sigmoid colon exhibiting mild irregular wall thickening and pericolonic fat stranding on the background of a diverticulum,, compatible with acute uncomplicated diverticulitis. No walled off abscess or loculated collection. No pneumoperitoneum. No disproportionate dilation of small or large bowel loops to suggest bowel obstruction. Vascular/Lymphatic: No ascites or pneumoperitoneum. No abdominal or pelvic lymphadenopathy, by size criteria. No aneurysmal dilation of the major abdominal arteries. Reproductive: The uterus is unremarkable. No large adnexal mass. Other: There is a tiny fat containing umbilical hernia. The soft tissues and abdominal wall are otherwise unremarkable. Musculoskeletal: No suspicious osseous lesions. IMPRESSION: *There is a 2 x 3 mm left vesicoureteric junction calculus with mild proximal obstructive uropathy. *There are 4-5 nonobstructing calculi in the right renal pelvis/extension into the right lower pole calyx with largest measuring up to 9 x 15 mm. No right hydroureteronephrosis or ureterolithiasis. *Proximal sigmoid colon  uncomplicated diverticulitis, as described above. *Multiple other nonacute observations, as described above. Electronically Signed   By: Beula Brunswick M.D.   On: 05/19/2024 14:31     [employer] D.O. on 05/19/2024 at 6:41 PM CC: Primary care physician; Nestor Banter, MD   05/19/2024, 6:41 PM

## 2024-05-19 NOTE — ED Triage Notes (Signed)
 Patient to ED via POV for left sided flank pain. Started today with blood in urine. Hx of kidney stones.

## 2024-05-19 NOTE — ED Provider Notes (Signed)
 [facility] Provider Note    Event Date/Time   First MD Initiated Contact with Patient 05/19/24 1145     (approximate)   History   Flank Pain   HPI  Barbara Cole is a 33 y.o. female with history of kidney stones presents emergency department with severe lower back pain radiates around to the abdomen.  Patient was seen at her doctor's office and they had given her prescription for antibiotic and Flomax  this morning however when she went to work she is doubled over in pain.  Denies fever or chills.  No vomiting.      Physical Exam   Triage Vital Signs: ED Triage Vitals  Encounter Vitals Group     BP 05/19/24 1143 94/80     Systolic BP Percentile --      Diastolic BP Percentile --      Pulse Rate 05/19/24 1143 (!) 121     Resp 05/19/24 1143 17     Temp 05/19/24 1143 98.2 F (36.8 C)     Temp Source 05/19/24 1143 Oral     SpO2 05/19/24 1143 98 %     Weight 05/19/24 1142 219 lb (99.3 kg)     Height 05/19/24 1142 5\' 2"  (1.575 m)     Head Circumference --      Peak Flow --      Pain Score 05/19/24 1142 10     Pain Loc --      Pain Education --      Exclude from Growth Chart --     Most recent vital signs: Vitals:   05/19/24 1500 05/19/24 1534  BP: 127/81   Pulse: 81   Resp: 18   Temp:  98.1 F (36.7 C)  SpO2: 94%      General: Awake, no distress.   CV:  Good peripheral perfusion.  Resp:  Normal effort.  Abd:  No distention.   Other:     ED Results / Procedures / Treatments   Labs (all labs ordered are listed, but only abnormal results are displayed) Labs Reviewed  URINALYSIS, ROUTINE W REFLEX MICROSCOPIC - Abnormal; Notable for the following components:      Result Value   Color, Urine RED (*)    APPearance CLOUDY (*)    Hgb urine dipstick   (*)    Value: TEST NOT REPORTED DUE TO COLOR INTERFERENCE OF URINE PIGMENT   Bilirubin Urine   (*)    Value: TEST NOT REPORTED DUE TO COLOR INTERFERENCE OF URINE PIGMENT    Ketones, ur   (*)    Value: TEST NOT REPORTED DUE TO COLOR INTERFERENCE OF URINE PIGMENT   Protein, ur   (*)    Value: TEST NOT REPORTED DUE TO COLOR INTERFERENCE OF URINE PIGMENT   Nitrite   (*)    Value: TEST NOT REPORTED DUE TO COLOR INTERFERENCE OF URINE PIGMENT   Leukocytes,Ua   (*)    Value: TEST NOT REPORTED DUE TO COLOR INTERFERENCE OF URINE PIGMENT   Bacteria, UA MANY (*)    All other components within normal limits  URINE CULTURE  BASIC METABOLIC PANEL WITH GFR  CBC  POC URINE PREG, ED     EKG     RADIOLOGY CT renal stone    PROCEDURES:   Procedures  Critical Care:  no Chief Complaint  Patient presents with   Flank Pain      MEDICATIONS ORDERED IN ED: Medications  cefTRIAXone  (ROCEPHIN ) 1 g  in sodium chloride  0.9 % 100 mL IVPB (1 g Intravenous New Bag/Given 05/19/24 1543)  fentaNYL  (SUBLIMAZE ) injection 25 mcg (25 mcg Intravenous Given 05/19/24 1231)  ondansetron  (ZOFRAN ) injection 4 mg (4 mg Intravenous Given 05/19/24 1232)  sodium chloride  0.9 % bolus 1,000 mL (0 mLs Intravenous Stopped 05/19/24 1413)  HYDROmorphone (DILAUDID) injection 1 mg (1 mg Intravenous Given 05/19/24 1330)  ketorolac  (TORADOL ) 15 MG/ML injection 15 mg (15 mg Intravenous Given 05/19/24 1532)  tamsulosin  (FLOMAX ) capsule 0.4 mg (0.4 mg Oral Given 05/19/24 1532)  sodium chloride  0.9 % bolus 1,000 mL (1,000 mLs Intravenous New Bag/Given 05/19/24 1532)     IMPRESSION / MDM / ASSESSMENT AND PLAN / ED COURSE  I reviewed the triage vital signs and the nursing notes.                              Differential diagnosis includes, but is not limited to, flank pain, kidney stone, pyelonephritis, UTI  Patient's presentation is most consistent with acute illness / injury with system symptoms.   Cardiac monitor no Medications given: Fentanyl  25 mg IV, Zofran  4 mg IV, normal saline 1 L IV  Labs ordered, POC pregnancy negative, CT renal stone study ordered  UA shows many bacteria, white  blood cell clumps present.  Concerns for infected kidney stone.  Patient given Rocephin  1 g IV, she was given a second dose of pain medication, Dilaudid 1 mg IV. Toradol  15 mg IV given  Consult to urology.  Dr. Ace Holder states lithotripsy not an option for 2 to 3 mm stone as it is very late to schedule for tomorrow.  Will see if patient is more comfortable after Toradol .  Will consider admission.  Admission would be for infected stone and pain control.  Care transferred to [facility], FNP      FINAL CLINICAL IMPRESSION(S) / ED DIAGNOSES   Final diagnoses:  Kidney stone  Acute UTI     Rx / DC Orders   ED Discharge Orders     None        Note:  This document was prepared using Dragon voice recognition software and may include unintentional dictation errors.    Delsie Figures, PA-C 05/19/24 1549    Ruth Cove, MD 05/20/24 2207

## 2024-05-19 NOTE — ED Provider Notes (Signed)
-----------------------------------------   3:50 PM on 05/19/2024 -----------------------------------------  Blood pressure 127/81, pulse 81, temperature 98.1 F (36.7 C), temperature source Oral, resp. rate 18, height 5\' 2"  (1.575 m), weight 99.3 kg, SpO2 94%, not currently breastfeeding.  Assuming care from Aneta Keepers, PA-C.  In short, Barbara Cole is a 33 y.o. female with a chief complaint of Flank Pain .  Refer to the original H&P for additional details.  The current plan of care is to control pain and discharge home.  ----------------------------------------- 4:51 PM on 05/19/2024 ----------------------------------------- Patient reports significant relief after Toradol .  While discussing discharge plan, she reports pain is returning and is now tearful. Repeat dose of toradol  ordered.  ----------------------------------------- 5:30 PM on 05/19/2024 -----------------------------------------  Patient now more comfortable, but requests to stay for pain control at least until tomorrow. She reports that she can not have pain medications at home because a family member that lives there is a recovering drug addict. Will discuss with hospitalist. Consult requested and patient accepted for observation.         Sherryle Don, FNP 05/20/24 1837    Ruth Cove, MD 05/20/24 2207

## 2024-05-19 NOTE — ED Notes (Signed)
 Pt tearful after CT and c/o increased pain. Pt medicated, see MAR

## 2024-05-20 DIAGNOSIS — N200 Calculus of kidney: Secondary | ICD-10-CM | POA: Diagnosis not present

## 2024-05-20 DIAGNOSIS — N201 Calculus of ureter: Secondary | ICD-10-CM

## 2024-05-20 DIAGNOSIS — N23 Unspecified renal colic: Secondary | ICD-10-CM

## 2024-05-20 DIAGNOSIS — N390 Urinary tract infection, site not specified: Secondary | ICD-10-CM

## 2024-05-20 LAB — CBC
HCT: 38.2 % (ref 36.0–46.0)
Hemoglobin: 12.7 g/dL (ref 12.0–15.0)
MCH: 29.4 pg (ref 26.0–34.0)
MCHC: 33.2 g/dL (ref 30.0–36.0)
MCV: 88.4 fL (ref 80.0–100.0)
Platelets: 273 10*3/uL (ref 150–400)
RBC: 4.32 MIL/uL (ref 3.87–5.11)
RDW: 12.5 % (ref 11.5–15.5)
WBC: 7.5 10*3/uL (ref 4.0–10.5)
nRBC: 0 % (ref 0.0–0.2)

## 2024-05-20 LAB — HIV ANTIBODY (ROUTINE TESTING W REFLEX): HIV Screen 4th Generation wRfx: NONREACTIVE

## 2024-05-20 NOTE — Consult Note (Signed)
 Urology Consult  I have been asked to see the patient by Dr. Ariel Begun, for evaluation and management of left ureteral stone.  Chief Complaint: Left flank pain, gross hematuria  History of Present Illness: Barbara Cole is a 33 y.o. year old female with PMH nephrolithiasis who presented to the ED yesterday with reports of left flank pain and gross hematuria.  She had seen her PCP earlier in the day and was prescribed Cipro  and Flomax , however she came to the ED when her pain worsened.  She had not yet started her medications.  CT stone study on presentation notable for a 2 x 3 mm left UVJ stone with mild proximal hydroureteronephrosis.  She also has nonobstructing right renal stones measuring up to 15 mm.  She was also noted to have proximal sigmoid uncomplicated diverticulitis.  Admission UA notable for >50 RBC/hpf, >50 WBC/hpf, many bacteria, and WBC clumps.  Notably, she describes this as a dirty catch.  UA with her PCP earlier in the day showed no pyuria and only rare bacteria.  Otherwise, creatinine was at baseline, 0.82 and white count was normal at 9.2.  She was started on antibiotics as below and her white count this morning is 7.5.  She reports pain resolved as of 4:30 PM yesterday.  She never saw stone pass.  She started her menstrual period since arrival, so difficult to visually determine if she has passed any stones.  She already has an outpatient visit scheduled with Dr. Ace Holder in early June for stone follow-up and she would like to go home.  Anti-infectives (From admission, onward)    Start     Dose/Rate Route Frequency Ordered Stop   05/20/24 1345  cefTRIAXone  (ROCEPHIN ) 1 g in sodium chloride  0.9 % 100 mL IVPB        1 g 200 mL/hr over 30 Minutes Intravenous  Once 05/19/24 2053     05/19/24 1800  ceFAZolin  (ANCEF ) IVPB 1 g/50 mL premix  Status:  Discontinued        1 g 100 mL/hr over 30 Minutes Intravenous  Once 05/19/24 1759 05/19/24 1759   05/19/24 1545  cefTRIAXone   (ROCEPHIN ) 1 g in sodium chloride  0.9 % 100 mL IVPB        1 g 200 mL/hr over 30 Minutes Intravenous  Once 05/19/24 1535 05/19/24 1620        Past Medical History:  Diagnosis Date   Anal fissure    Anxiety    Depression    Gastritis    Headache    MIGRAINES   History of kidney stones 07/2019   Obstruction of right ureteropelvic junction due to stone 06/2023   Renal cyst, left    Stomach ulcer     Past Surgical History:  Procedure Laterality Date   CHOLECYSTECTOMY N/A 08/25/2019   Procedure: LAPAROSCOPIC CHOLECYSTECTOMY;  Surgeon: Eldred Grego, MD;  Location: [facility] ORS;  Service: General;  Laterality: N/A;   COLONOSCOPY  AGE 3   CYSTOSCOPY W/ RETROGRADES Bilateral 02/19/2021   Procedure: CYSTOSCOPY WITH RETROGRADE PYELOGRAM;  Surgeon: Dustin Gimenez, MD;  Location: [facility] ORS;  Service: Urology;  Laterality: Bilateral;   CYSTOSCOPY W/ RETROGRADES  07/24/2023   Procedure: CYSTOSCOPY WITH RETROGRADE PYELOGRAM;  Surgeon: Dustin Gimenez, MD;  Location: [facility];  Service: Urology;;   CYSTOSCOPY/URETEROSCOPY/HOLMIUM LASER/STENT PLACEMENT Right 02/19/2021   Procedure: CYSTOSCOPY/URETEROSCOPY/HOLMIUM LASER/STENT PLACEMENT;  Surgeon: Dustin Gimenez, MD;  Location: [facility] OR[facility];  Service: Urology;  Laterality: Right;   CYSTOSCOPY/URETEROSCOPY/HOLMIUM LASER/STENT PLACEMENT Right 07/24/2023  Procedure: CYSTOSCOPY/URETEROSCOPY/HOLMIUM LASER/STENT PLACEMENT;  Surgeon: Dustin Gimenez, MD;  Location: [facility];  Service: Urology;  Laterality: Right;   ESOPHAGOGASTRODUODENOSCOPY (EGD) WITH PROPOFOL  N/A 07/12/2019   Procedure: ESOPHAGOGASTRODUODENOSCOPY (EGD) WITH PROPOFOL ;  Surgeon: Deveron Fly, MD;  Location[facility] END[facility];  Service: Endoscopy;  Laterality: N/A;   WISDOM TOOTH EXTRACTION      Home Medications:  Current Meds  Medication Sig   sertraline  (ZOLOFT ) 50 MG tablet Take 75 mg by mouth daily.   valACYclovir  (VALTREX ) 1000 MG tablet Take 1 tablet (1,000 mg total) by mouth 2  (two) times daily for 10 days. (Patient taking differently: Take 1,000 mg by mouth as needed.)   [DISCONTINUED] ketorolac  (TORADOL ) 10 MG tablet Take 1 tablet (10 mg total) by mouth every 8 (eight) hours as needed for severe pain.   [DISCONTINUED] oxybutynin  (DITROPAN ) 5 MG tablet Take 1 tablet (5 mg total) by mouth every 8 (eight) hours as needed for bladder spasms.    Allergies:  Allergies  Allergen Reactions   Antacid Medicine [Traumeel] Other (See Comments)    High doses of antacids were causing dizziness.  Not sure if she still has a stomach ulcer so her MD d/c'd these meds   Hydrocodone  Rash    Was listed as anaphylaxis but that was incorrect per pt report.    Family History  Problem Relation Age of Onset   Diabetes Maternal Grandfather    Cancer Maternal Grandfather    Pancreatic cancer Maternal Grandfather    Crohn's disease Maternal Grandfather    Colon cancer Neg Hx     Social History:  reports that she has never smoked. She has never used smokeless tobacco. She reports current alcohol use. She reports that she does not use drugs.  ROS: A complete review of systems was performed.  All systems are negative except for pertinent findings as noted.  Physical Exam:  Vital signs in last 24 hours: Temp:  [98.1 F (36.7 C)-98.3 F (36.8 C)] 98.1 F (36.7 C) (05/22 0830) Pulse Rate:  [68-121] 68 (05/22 0830) Resp:  [16-19] 19 (05/22 0830) BP: (94-137)/(63-102) 120/84 (05/22 0830) SpO2:  [92 %-100 %] 99 % (05/22 0830) Weight:  [99.3 kg] 99.3 kg (05/21 1142) Constitutional:  Alert and oriented, no acute distress HEENT: [place] AT, moist mucus membranes Cardiovascular: No clubbing, cyanosis, or edema Respiratory: Normal respiratory effort Skin: No rashes, bruises or suspicious lesions Neurologic: Grossly intact, no focal deficits, moving all 4 extremities Psychiatric: Normal mood and affect  Laboratory Data:  Recent Labs    05/19/24 1210 05/20/24 0430  WBC 9.2 7.5  HGB  14.4 12.7  HCT 42.5 38.2   Recent Labs    05/19/24 1210  NA 139  K 3.7  CL 106  CO2 24  GLUCOSE 99  BUN 14  CREATININE 0.82  CALCIUM 9.3   Urinalysis    Component Value Date/Time   COLORURINE RED (A) 05/19/2024 1206   APPEARANCEUR CLOUDY (A) 05/19/2024 1206   APPEARANCEUR Hazy (A) 07/14/2023 1303   LABSPEC  05/19/2024 1206    TEST NOT REPORTED DUE TO COLOR INTERFERENCE OF URINE PIGMENT   PHURINE  05/19/2024 1206    TEST NOT REPORTED DUE TO COLOR INTERFERENCE OF URINE PIGMENT   GLUCOSEU NEGATIVE 05/19/2024 1206   HGBUR (A) 05/19/2024 1206    TEST NOT REPORTED DUE TO COLOR INTERFERENCE OF URINE PIGMENT   BILIRUBINUR (A) 05/19/2024 1206    TEST NOT REPORTED DUE TO COLOR INTERFERENCE OF URINE PIGMENT   BILIRUBINUR  Negative 07/14/2023 1303   KETONESUR (A) 05/19/2024 1206    TEST NOT REPORTED DUE TO COLOR INTERFERENCE OF URINE PIGMENT   PROTEINUR (A) 05/19/2024 1206    TEST NOT REPORTED DUE TO COLOR INTERFERENCE OF URINE PIGMENT   NITRITE (A) 05/19/2024 1206    TEST NOT REPORTED DUE TO COLOR INTERFERENCE OF URINE PIGMENT   LEUKOCYTESUR (A) 05/19/2024 1206    TEST NOT REPORTED DUE TO COLOR INTERFERENCE OF URINE PIGMENT   Results for orders placed or performed during the hospital encounter of 07/26/23  Urine Culture (for pregnant, neutropenic or urologic patients or patients with an indwelling urinary catheter)     Status: Abnormal   Collection Time: 07/26/23  8:30 PM   Specimen: Urine, Clean Catch  Result Value Ref Range Status   Specimen Description   Final    URINE, CLEAN CATCH Performed at [facility], [address] [place], [place] [address]    Special Requests   Final    NONE Performed at [facility], [address] [place], [place] [address]    Culture (A)  Final    <10,000 COLONIES/mL INSIGNIFICANT GROWTH Performed at [facility], [address] [place], [place] [address]    Report Status 07/28/2023 FINAL  Final     Radiologic Imaging: CT Renal Stone Study Result Date: 05/19/2024 CLINICAL DATA:  Abdominal/flank pain, stone suspected. Left-sided flank pain. EXAM: CT ABDOMEN AND PELVIS WITHOUT CONTRAST TECHNIQUE: Multidetector CT imaging of the abdomen and pelvis was performed following the standard protocol without IV contrast. RADIATION DOSE REDUCTION: This exam was performed according to the departmental dose-optimization program which includes automated exposure control, adjustment of the mA and/or kV according to patient size and/or use of iterative reconstruction technique. COMPARISON:  CT scan renal stone protocol from 04/17/2022. FINDINGS: Lower chest: The lung bases are clear. No pleural effusion. The heart is normal in size. No pericardial effusion. Hepatobiliary: The liver is normal in size. Non-cirrhotic configuration. No suspicious mass. These is mild diffuse hepatic steatosis. no intrahepatic or extrahepatic bile duct dilation. Gallbladder is surgically absent. Pancreas: Unremarkable. No pancreatic ductal dilatation or surrounding inflammatory changes. Spleen: Within normal limits. No focal lesion. Adrenals/Urinary Tract: Adrenal glands are unremarkable. No suspicious renal mass within the limitations of this unenhanced exam. Redemonstration of is slightly hyperattenuating ill-defined structure in the left kidney lower pole, incompletely characterized on the current exam but unchanged since the prior study from 04/17/2022 favoring proteinaceous/hemorrhagic cyst. There are 4-5 nonobstructing calculi in the right renal pelvis/extension into the right lower pole calyx with largest measuring up to 9 x 15 mm. No right hydroureteronephrosis or ureterolithiasis. There is a 2 x 3 mm left vesicoureteric junction calculus with mild proximal obstructive uropathy. No other left nephroureterolithiasis. Urinary bladder is under distended, precluding optimal assessment. However, no large mass identified. No perivesical fat  stranding. Stomach/Bowel: Unremarkable appendix. There is short segment of proximal sigmoid colon exhibiting mild irregular wall thickening and pericolonic fat stranding on the background of a diverticulum,, compatible with acute uncomplicated diverticulitis. No walled off abscess or loculated collection. No pneumoperitoneum. No disproportionate dilation of small or large bowel loops to suggest bowel obstruction. Vascular/Lymphatic: No ascites or pneumoperitoneum. No abdominal or pelvic lymphadenopathy, by size criteria. No aneurysmal dilation of the major abdominal arteries. Reproductive: The uterus is unremarkable. No large adnexal mass. Other: There is a tiny fat containing umbilical hernia. The soft tissues and abdominal wall are otherwise unremarkable. Musculoskeletal: No suspicious osseous lesions. IMPRESSION: *There  is a 2 x 3 mm left vesicoureteric junction calculus with mild proximal obstructive uropathy. *There are 4-5 nonobstructing calculi in the right renal pelvis/extension into the right lower pole calyx with largest measuring up to 9 x 15 mm. No right hydroureteronephrosis or ureterolithiasis. *Proximal sigmoid colon uncomplicated diverticulitis, as described above. *Multiple other nonacute observations, as described above. Electronically Signed   By: Beula Brunswick M.D.   On: 05/19/2024 14:31   Assessment & Plan:  33 y.o. female with PMH nephrolithiasis admitted with renal colic secondary to a 3 mm left UVJ stone, also with nonobstructing right renal stone burden.  I strongly suspect that her ED UA was contaminated and currently have low suspicion for UTI.  With her pain having resolved over 12 hours ago, I think it is reasonable to discharge her home and have her keep plans for outpatient follow-up with Dr. Ace Holder early next month.  We discussed return precautions including fevers, uncontrolled pain, and uncontrolled nausea/vomiting.  Recommendations: - Discharge on meds as  below: -Flomax  0.4 mg daily (already prescribed by PCP) -Cipro  250mg  twice daily x5 days (already prescribed by PCP--though will defer to ED team if antibiotic adjustment is desired in light of imaging findings of uncomplicated diverticulitis) -Zofran  -PO Toradol  (she declined Percocet) - Outpatient follow-up with Dr. Ace Holder in early June as scheduled  Thank you for involving me in this patient's care, please page with any further questions or concerns.  Brezlyn Manrique, PA-C 05/20/2024 10:11 AM

## 2024-05-20 NOTE — Discharge Summary (Signed)
 Physician Discharge Summary   Patient: Barbara Cole MRN: [id number] DOB: 1991-07-07  Admit date:     05/19/2024  Discharge date: 05/20/24  Discharge Physician: Luna Salinas   PCP: Nestor Banter, MD   Recommendations at discharge:  Please follow-up urine culture results Follow-up with primary care provider Follow-up with urology  Discharge Diagnoses: Principal Problem:   Kidney stone on left side Active Problems:   Acute UTI   Hospital Course: Barbara Cole is a 33 y.o. female with a known history of anxiety, depression, gastritis, migraines, kidney stones and peptic ulcer disease presents to the emergency department for evaluation of left-sided flank pain and hematuria.  Patient has an history of renal stones in the past.  On presentation hemodynamically stable.  Renal stone study with 2 left vesicoureteric junction stones with mild proximal obstructive uropathy.  Also noted 4-5 nonobstructing calculi in the right renal pelvis with no right hydronephrosis or ureterolithiasis.  Urology was consulted from ED and patient was started on ceftriaxone .  5/22: Vitals and labs stable.  Urine cultures pending.  Her pain resolved.  Urology evaluated her and they are recommending continuation of recently prescribed ciprofloxacin  and Flomax  and they will follow-up as outpatient for further evaluation and management of her nephrolithiasis.  Patient will continue on current medications and follow-up with her providers for further assistance.  Consultants: Urology Procedures performed: None Disposition: Home Diet recommendation:  Discharge Diet Orders (From admission, onward)     Start     Ordered   05/20/24 0000  Diet - low sodium heart healthy        05/20/24 1037           Regular diet DISCHARGE MEDICATION: Allergies as of 05/20/2024       Reactions   Antacid Medicine [traumeel] Other (See Comments)   High doses of antacids were causing dizziness.  Not sure if she  still has a stomach ulcer so her MD d/c'd these meds   Hydrocodone  Rash   Was listed as anaphylaxis but that was incorrect per pt report.        Medication List     TAKE these medications    ciprofloxacin  250 MG tablet Commonly known as: CIPRO  Take 250 mg by mouth 2 (two) times daily.   sertraline  50 MG tablet Commonly known as: ZOLOFT  Take 75 mg by mouth daily.   tamsulosin  0.4 MG Caps capsule Commonly known as: Flomax  Take 1 capsule (0.4 mg total) by mouth daily.   valACYclovir  1000 MG tablet Commonly known as: VALTREX  Take 1 tablet (1,000 mg total) by mouth 2 (two) times daily for 10 days. What changed:  when to take this reasons to take this        Follow-up Information     Nestor Banter, MD. Schedule an appointment as soon as possible for a visit in 1 week(s).   Specialty: Family Medicine Contact information: [address] [facility] [place] [address] [phone]         Dustin Gimenez, MD Follow up.   Specialty: Urology Contact information: [address] [place] [address] [phone]                Discharge Exam: Barbara Cole   05/19/24 1142  Weight: 99.3 kg   General.  Obese lady, in no acute distress. Pulmonary.  Lungs clear bilaterally, normal respiratory effort. CV.  Regular rate and rhythm, no JVD, rub or murmur. Abdomen.  Soft, nontender, nondistended, BS positive. CNS.  Alert and oriented .  No focal neurologic deficit. Extremities.  No edema, no cyanosis, pulses intact and symmetrical. Psychiatry.  Judgment and insight appears normal.   Condition at discharge: stable  The results of significant diagnostics from this hospitalization (including imaging, microbiology, ancillary and laboratory) are listed below for reference.   Imaging Studies: CT Renal Stone Study Result Date: 05/19/2024 CLINICAL DATA:  Abdominal/flank pain, stone suspected.  Left-sided flank pain. EXAM: CT ABDOMEN AND PELVIS WITHOUT CONTRAST TECHNIQUE: Multidetector CT imaging of the abdomen and pelvis was performed following the standard protocol without IV contrast. RADIATION DOSE REDUCTION: This exam was performed according to the departmental dose-optimization program which includes automated exposure control, adjustment of the mA and/or kV according to patient size and/or use of iterative reconstruction technique. COMPARISON:  CT scan renal stone protocol from 04/17/2022. FINDINGS: Lower chest: The lung bases are clear. No pleural effusion. The heart is normal in size. No pericardial effusion. Hepatobiliary: The liver is normal in size. Non-cirrhotic configuration. No suspicious mass. These is mild diffuse hepatic steatosis. no intrahepatic or extrahepatic bile duct dilation. Gallbladder is surgically absent. Pancreas: Unremarkable. No pancreatic ductal dilatation or surrounding inflammatory changes. Spleen: Within normal limits. No focal lesion. Adrenals/Urinary Tract: Adrenal glands are unremarkable. No suspicious renal mass within the limitations of this unenhanced exam. Redemonstration of is slightly hyperattenuating ill-defined structure in the left kidney lower pole, incompletely characterized on the current exam but unchanged since the prior study from 04/17/2022 favoring proteinaceous/hemorrhagic cyst. There are 4-5 nonobstructing calculi in the right renal pelvis/extension into the right lower pole calyx with largest measuring up to 9 x 15 mm. No right hydroureteronephrosis or ureterolithiasis. There is a 2 x 3 mm left vesicoureteric junction calculus with mild proximal obstructive uropathy. No other left nephroureterolithiasis. Urinary bladder is under distended, precluding optimal assessment. However, no large mass identified. No perivesical fat stranding. Stomach/Bowel: Unremarkable appendix. There is short segment of proximal sigmoid colon exhibiting mild irregular  wall thickening and pericolonic fat stranding on the background of a diverticulum,, compatible with acute uncomplicated diverticulitis. No walled off abscess or loculated collection. No pneumoperitoneum. No disproportionate dilation of small or large bowel loops to suggest bowel obstruction. Vascular/Lymphatic: No ascites or pneumoperitoneum. No abdominal or pelvic lymphadenopathy, by size criteria. No aneurysmal dilation of the major abdominal arteries. Reproductive: The uterus is unremarkable. No large adnexal mass. Other: There is a tiny fat containing umbilical hernia. The soft tissues and abdominal wall are otherwise unremarkable. Musculoskeletal: No suspicious osseous lesions. IMPRESSION: *There is a 2 x 3 mm left vesicoureteric junction calculus with mild proximal obstructive uropathy. *There are 4-5 nonobstructing calculi in the right renal pelvis/extension into the right lower pole calyx with largest measuring up to 9 x 15 mm. No right hydroureteronephrosis or ureterolithiasis. *Proximal sigmoid colon uncomplicated diverticulitis, as described above. *Multiple other nonacute observations, as described above. Electronically Signed   By: Beula Brunswick M.D.   On: 05/19/2024 14:31    Microbiology: Results for orders placed or performed during the hospital encounter of 07/26/23  Urine Culture (for pregnant, neutropenic or urologic patients or patients with an indwelling urinary catheter)     Status: Abnormal   Collection Time: 07/26/23  8:30 PM   Specimen: Urine, Clean Catch  Result Value Ref Range Status   Specimen Description   Final    URINE, CLEAN CATCH Performed at [facility], [address], [place], [place] [address]    Special Requests  Final    NONE Performed at [facility], [address], [place], [place] [address]    Culture (A)  Final    <10,000 COLONIES/mL INSIGNIFICANT GROWTH Performed at [facility], [address], [place], [place] [address]     Report Status 07/28/2023 FINAL  Final    Labs: CBC: Recent Labs  Lab 05/19/24 1210 05/20/24 0430  WBC 9.2 7.5  HGB 14.4 12.7  HCT 42.5 38.2  MCV 87.6 88.4  PLT 315 273   Basic Metabolic Panel: Recent Labs  Lab 05/19/24 1210  NA 139  K 3.7  CL 106  CO2 24  GLUCOSE 99  BUN 14  CREATININE 0.82  CALCIUM 9.3   Liver Function Tests: No results for input(s): "AST", "ALT", "ALKPHOS", "BILITOT", "PROT", "ALBUMIN" in the last 168 hours. CBG: No results for input(s): "GLUCAP" in the last 168 hours.  Discharge time spent: greater than 30 minutes.  This record has been created using Conservation officer, historic buildings. Errors have been sought and corrected,but may not always be located. Such creation errors do not reflect on the standard of care.   Signed: Luna Salinas, MD [facility] 05/20/2024

## 2024-05-20 NOTE — ED Notes (Addendum)
 Pt requested for IV fluids to be stopped. Given education on importance of continuing fluids. Pt then requests to stop fluids for "a little bit". IV lined has a good blood returned, line flushed and saline locked.

## 2024-05-20 NOTE — Hospital Course (Addendum)
 Barbara Cole is a 33 y.o. female with a known history of anxiety, depression, gastritis, migraines, kidney stones and peptic ulcer disease presents to the emergency department for evaluation of left-sided flank pain and hematuria.  Patient has an history of renal stones in the past.  On presentation hemodynamically stable.  Renal stone study with 2 left vesicoureteric junction stones with mild proximal obstructive uropathy.  Also noted 4-5 nonobstructing calculi in the right renal pelvis with no right hydronephrosis or ureterolithiasis.  Urology was consulted from ED and patient was started on ceftriaxone .  5/22: Vitals and labs stable.  Urine cultures pending.  Her pain resolved.  Urology evaluated her and they are recommending continuation of recently prescribed ciprofloxacin  and Flomax  and they will follow-up as outpatient for further evaluation and management of her nephrolithiasis.  Patient will continue on current medications and follow-up with her providers for further assistance.

## 2024-05-21 LAB — URINE CULTURE

## 2024-06-01 ENCOUNTER — Ambulatory Visit: Admitting: Urology

## 2024-06-02 ENCOUNTER — Ambulatory Visit: Admitting: Urology

## 2024-09-22 ENCOUNTER — Ambulatory Visit
Admission: EM | Admit: 2024-09-22 | Discharge: 2024-09-22 | Disposition: A | Discharge status: Home / Self Care | Attending: Family Medicine | Admitting: Family Medicine

## 2024-09-22 DIAGNOSIS — K5732 Diverticulitis of large intestine without perforation or abscess without bleeding: Secondary | ICD-10-CM | POA: Diagnosis present

## 2024-09-22 DIAGNOSIS — R1032 Left lower quadrant pain: Secondary | ICD-10-CM | POA: Diagnosis not present

## 2024-09-22 DIAGNOSIS — R509 Fever, unspecified: Secondary | ICD-10-CM | POA: Insufficient documentation

## 2024-09-22 LAB — BASIC METABOLIC PANEL WITH GFR
Anion gap: 11 (ref 5–15)
BUN: 13 mg/dL (ref 6–20)
CO2: 20 mmol/L — ABNORMAL LOW (ref 22–32)
Calcium: 8.7 mg/dL — ABNORMAL LOW (ref 8.9–10.3)
Chloride: 104 mmol/L (ref 98–111)
Creatinine, Ser: 0.66 mg/dL (ref 0.44–1.00)
GFR, Estimated: >60 mL/min (ref >60)
Glucose, Bld: 107 mg/dL — ABNORMAL HIGH (ref 70–99)
Potassium: 3.8 mmol/L (ref 3.5–5.1)
Sodium: 135 mmol/L (ref 135–145)

## 2024-09-22 LAB — URINALYSIS, COMPLETE (UACMP) WITH MICROSCOPIC
Bilirubin Urine: NEGATIVE
Glucose, UA: NEGATIVE mg/dL
Ketones, ur: NEGATIVE mg/dL
Leukocytes,Ua: NEGATIVE
Nitrite: NEGATIVE
Protein, ur: NEGATIVE mg/dL
Specific Gravity, Urine: >1.03 — ABNORMAL HIGH (ref 1.005–1.030)
pH: 5.5 (ref 5.0–8.0)

## 2024-09-22 LAB — CBC WITH DIFFERENTIAL/PLATELET
Abs Immature Granulocytes: 0.03 K/uL (ref 0.00–0.07)
Basophils Absolute: 0.1 K/uL (ref 0.0–0.1)
Basophils Relative: 0 %
Eosinophils Absolute: 0.2 K/uL (ref 0.0–0.5)
Eosinophils Relative: 1 %
HCT: 43.1 % (ref 36.0–46.0)
Hemoglobin: 14.9 g/dL (ref 12.0–15.0)
Immature Granulocytes: 0 %
Lymphocytes Relative: 17 %
Lymphs Abs: 2.6 K/uL (ref 0.7–4.0)
MCH: 29.5 pg (ref 26.0–34.0)
MCHC: 34.6 g/dL (ref 30.0–36.0)
MCV: 85.3 fL (ref 80.0–100.0)
Monocytes Absolute: 1.2 K/uL — ABNORMAL HIGH (ref 0.1–1.0)
Monocytes Relative: 8 %
Neutro Abs: 10.8 K/uL — ABNORMAL HIGH (ref 1.7–7.7)
Neutrophils Relative %: 74 %
Platelets: 298 K/uL (ref 150–400)
RBC: 5.05 MIL/uL (ref 3.87–5.11)
RDW: 12.5 % (ref 11.5–15.5)
WBC: 14.8 K/uL — ABNORMAL HIGH (ref 4.0–10.5)
nRBC: 0 % (ref 0.0–0.2)

## 2024-09-22 LAB — PREGNANCY, URINE: Preg Test, Ur: NEGATIVE

## 2024-09-22 MED ORDER — AMOXICILLIN-POT CLAVULANATE 875-125 MG PO TABS: 1.0000 | ORAL_TABLET | Freq: Two times a day (BID) | ORAL | 0 refills | Status: AC

## 2024-09-22 MED ORDER — ACETAMINOPHEN 325 MG PO TABS
650.0000 mg | ORAL_TABLET | Freq: Once | ORAL | Status: AC
  Administered 2024-09-22: 650 mg via ORAL

## 2024-09-22 NOTE — ED Triage Notes (Signed)
 Patient states that she's had LLQ pain that comes and goes since Sunday.patient states that she gets some relief when she has a BM but the abdominal pain comes back.  Patient states that she started with a fever and headache yesterday.

## 2024-09-22 NOTE — Discharge Instructions (Addendum)
 You were seen today for abdominal pain.  Based on your history, this is likely diverticulitis.  I have sent an antibiotic to take twice/day x 10 days.  You may take tylenol  for fever and pain.  Your blood work does show an elevated WBC, consistent with infection.   Please start the antibiotic as soon as possible.  If you have worsening pain, worsening fever, or develop nausea, vomiting, or blood in your stool, then go the nearest Emergency Room for further evaluation.

## 2024-09-22 NOTE — ED Provider Notes (Signed)
 [facility]    CSN: [id number] Arrival date & time: 09/22/24  1216      History   Chief Complaint Chief Complaint  Patient presents with   Abdominal Pain   Fever    HPI Barbara Cole is a 33 y.o. female.    Abdominal Pain Associated symptoms: fever   Associated symptoms: no constipation, no diarrhea, no nausea and no vomiting   Fever Associated symptoms: no diarrhea, no nausea and no vomiting    Patient is here for LLQ pain x 4 days.   Pain is right at the left groin.  She feels bloated all the time, but then has intense gas pain.  She can sometimes pass gas, or have a BM and relieves the pain for while.  It was 7/10 initially, but now is about 2/10.  No n/v.  She has headaches and fever since yesterday.  No urinary symptoms.  LMP was several weeks.  She does have h/o kidney stones, but this does not feel the same as a stone.  She has been eating a lot of popcorn since last week.  No change in Bms.  Bms are a bit thinner, but no blood.   She did feel this in May, but but then went to the ER for a kidney stone.   CT did show kidney stone, but also showed diverticulitis.   Given an abx for urinary issues at that time, and abdominal pain improved.        Past Medical History:  Diagnosis Date   Anal fissure    Anxiety    Depression    Gastritis    Headache    MIGRAINES   History of kidney stones 07/2019   Obstruction of right ureteropelvic junction due to stone 06/2023   Renal cyst, left    Stomach ulcer     Patient Active Problem List   Diagnosis Date Noted   Acute UTI 05/20/2024   Kidney stone on left side 05/19/2024   Postpartum care following vaginal delivery 1/16 01/15/2023   SVD (spontaneous vaginal delivery) 01/15/2023   Encounter for induction of labor 01/14/2023    Past Surgical History:  Procedure Laterality Date   CHOLECYSTECTOMY N/A 08/25/2019   Procedure: LAPAROSCOPIC CHOLECYSTECTOMY;  Surgeon: Rodolph Romano, MD;   Location: [facility] ORS;  Service: General;  Laterality: N/A;   COLONOSCOPY  AGE 35   CYSTOSCOPY W/ RETROGRADES Bilateral 02/19/2021   Procedure: CYSTOSCOPY WITH RETROGRADE PYELOGRAM;  Surgeon: Penne Knee, MD;  Location: [facility];  Service: Urology;  Laterality: Bilateral;   CYSTOSCOPY W/ RETROGRADES  07/24/2023   Procedure: CYSTOSCOPY WITH RETROGRADE PYELOGRAM;  Surgeon: Penne Knee, MD;  Location: [facility];  Service: Urology;;   CYSTOSCOPY/URETEROSCOPY/HOLMIUM LASER/STENT PLACEMENT Right 02/19/2021   Procedure: CYSTOSCOPY/URETEROSCOPY/HOLMIUM LASER/STENT PLACEMENT;  Surgeon: Penne Knee, MD;  Location: [facility];  Service: Urology;  Laterality: Right;   CYSTOSCOPY/URETEROSCOPY/HOLMIUM LASER/STENT PLACEMENT Right 07/24/2023   Procedure: CYSTOSCOPY/URETEROSCOPY/HOLMIUM LASER/STENT PLACEMENT;  Surgeon: Penne Knee, MD;  Location: [facility];  Service: Urology;  Laterality: Right;   ESOPHAGOGASTRODUODENOSCOPY (EGD) WITH PROPOFOL  N/A 07/12/2019   Procedure: ESOPHAGOGASTRODUODENOSCOPY (EGD) WITH PROPOFOL ;  Surgeon: Gaylyn Gladis PENNER, MD;  Location: [facility] END[facility]COPY;  Service: Endoscopy;  Laterality: N/A;   WISDOM TOOTH EXTRACTION      OB History     Gravida  2   Para  2   Term  2   Preterm  0   AB  0   Living  2      SAB  0   IAB  0   Ectopic  0   Multiple  0   Live Births  2            Home Medications    Prior to Admission medications   Medication Sig Start Date End Date Taking? Authorizing Provider  tamsulosin  (FLOMAX ) 0.4 MG CAPS capsule Take 1 capsule (0.4 mg total) by mouth daily. 07/24/23   Penne Knee, MD  valACYclovir  (VALTREX ) 1000 MG tablet Take 1 tablet (1,000 mg total) by mouth 2 (two) times daily for 10 days. Patient taking differently: Take 1,000 mg by mouth as needed. 02/17/23       Family History Family History  Problem Relation Age of Onset   Diabetes Maternal Grandfather    Cancer Maternal Grandfather    Pancreatic cancer Maternal  Grandfather    Crohn's disease Maternal Grandfather    Colon cancer Neg Hx     Social History Social History   Tobacco Use   Smoking status: Never   Smokeless tobacco: Never  Vaping Use   Vaping status: Never Used  Substance Use Topics   Alcohol use: Yes    Comment: rarely   Drug use: Never     Allergies   Antacid medicine [traumeel] and Hydrocodone    Review of Systems Review of Systems  Constitutional:  Positive for fever.  HENT: Negative.    Respiratory: Negative.    Cardiovascular: Negative.   Gastrointestinal:  Positive for abdominal pain. Negative for blood in stool, constipation, diarrhea, nausea and vomiting.  Genitourinary: Negative.   Musculoskeletal: Negative.   Skin: Negative.   Psychiatric/Behavioral: Negative.       Physical Exam Triage Vital Signs ED Triage Vitals [09/22/24 1229]  Encounter Vitals Group     BP (!) 137/95     Girls Systolic BP Percentile      Girls Diastolic BP Percentile      Boys Systolic BP Percentile      Boys Diastolic BP Percentile      Pulse Rate (!) 121     Resp 19     Temp (!) 101 F (38.3 C)     Temp Source Oral     SpO2 95 %     Weight 219 lb (99.3 kg)     Height      Head Circumference      Peak Flow      Pain Score 0     Pain Loc      Pain Education      Exclude from Growth Chart    No data found.  Updated Vital Signs BP (!) 137/95 (BP Location: Right Arm)   Pulse (!) 121   Temp (!) 101 F (38.3 C) (Oral)   Resp 19   Wt 99.3 kg   LMP 09/08/2024 (Approximate)   SpO2 95%   BMI 40.06 kg/m   Visual Acuity Right Eye Distance:   Left Eye Distance:   Bilateral Distance:    Right Eye Near:   Left Eye Near:    Bilateral Near:     Physical Exam Constitutional:      General: She is not in acute distress.    Appearance: She is well-developed and normal weight. She is not ill-appearing.     Comments: She does not appear to be in significant pain at this time  Cardiovascular:     Rate and Rhythm:  Normal rate and regular rhythm.  Pulmonary:     Effort: Pulmonary effort is normal.  Breath sounds: Normal breath sounds.  Abdominal:     General: Bowel sounds are normal.     Palpations: Abdomen is soft.     Tenderness: There is abdominal tenderness in the left lower quadrant. There is no right CVA tenderness, left CVA tenderness, guarding or rebound.  Skin:    General: Skin is warm.  Neurological:     General: No focal deficit present.     Mental Status: She is alert.  Psychiatric:        Mood and Affect: Mood normal.      UC Treatments / Results  Labs (all labs ordered are listed, but only abnormal results are displayed) Labs Reviewed  URINALYSIS, COMPLETE (UACMP) WITH MICROSCOPIC - Abnormal; Notable for the following components:      Result Value   APPearance HAZY (*)    Specific Gravity, Urine >1.030 (*)    Hgb urine dipstick SMALL (*)    Bacteria, UA FEW (*)    All other components within normal limits  BASIC METABOLIC PANEL WITH GFR - Abnormal; Notable for the following components:   CO2 20 (*)    Glucose, Bld 107 (*)    Calcium 8.7 (*)    All other components within normal limits  CBC WITH DIFFERENTIAL/PLATELET - Abnormal; Notable for the following components:   WBC 14.8 (*)    Neutro Abs 10.8 (*)    Monocytes Absolute 1.2 (*)    All other components within normal limits  PREGNANCY, URINE   UPT negative  EKG   Radiology No results found.  Procedures Procedures (including critical care time)  Medications Ordered in UC Medications  acetaminophen  (TYLENOL ) tablet 650 mg (650 mg Oral Given 09/22/24 1234)    Initial Impression / Assessment and Plan / UC Course  I have reviewed the triage vital signs and the nursing notes.  Pertinent labs & imaging results that were available during my care of the patient were reviewed by me and considered in my medical decision making (see chart for details).     Final Clinical Impressions(s) / UC Diagnoses    Final diagnoses:  LLQ pain  Fever, unspecified  Diverticulitis of colon     Discharge Instructions      You were seen today for abdominal pain.  Based on your history, this is likely diverticulitis.  I have sent an antibiotic to take twice/day x 10 days.  You may take tylenol  for fever and pain.  Your blood work does show an elevated WBC, consistent with infection.   Please start the antibiotic as soon as possible.  If you have worsening pain, worsening fever, or develop nausea, vomiting, or blood in your stool, then go the nearest Emergency Room for further evaluation.     ED Prescriptions     Medication Sig Dispense Auth. Provider   amoxicillin -clavulanate (AUGMENTIN ) 875-125 MG tablet Take 1 tablet by mouth every 12 (twelve) hours for 10 days. 20 tablet Darral Longs, MD      PDMP not reviewed this encounter.   Darral Longs, MD 09/22/24 [phone]

## 2024-11-28 ENCOUNTER — Encounter: Payer: Self-pay | Admitting: Emergency Medicine

## 2024-11-28 ENCOUNTER — Ambulatory Visit
Admission: EM | Admit: 2024-11-28 | Discharge: 2024-11-28 | Disposition: A | Discharge status: Home / Self Care | Attending: Nurse Practitioner | Admitting: Nurse Practitioner

## 2024-11-28 DIAGNOSIS — J029 Acute pharyngitis, unspecified: Secondary | ICD-10-CM | POA: Diagnosis present

## 2024-11-28 LAB — POCT RAPID STREP A (OFFICE): Rapid Strep A Screen: NEGATIVE

## 2024-11-28 LAB — POCT MONO SCREEN (KUC): Mono, POC: NEGATIVE

## 2024-11-28 MED ORDER — LIDOCAINE VISCOUS HCL 2 % MT SOLN: 15.0000 mL | OROMUCOSAL | 0 refills | Status: AC | PRN

## 2024-11-28 NOTE — ED Triage Notes (Signed)
 Pt c/o sore throat, fever, white patches in the back of her throat. Started about 3 days ago.

## 2024-11-28 NOTE — ED Provider Notes (Signed)
 [facility]    CSN: [id number] Arrival date & time: 11/28/24  0807      History   Chief Complaint Chief Complaint  Patient presents with   Sore Throat   Fever    HPI Barbara Cole is a 33 y.o. female.   Patient presents today with 2-day history of low-grade fever, Tmax 100.4 F at home, sore throat, headache, decreased appetite because it hurts to swallow.  No cough, runny or stuffy nose, abdominal pain, nausea/vomiting, or diarrhea.  No new rash.  She has been more tired than normal.  Reports her kids have been sick for the past month but nobody has had strep that she knows of at home.  She took ibuprofen  less than at home for the fever which did help break the fever.  Otherwise, not taking anything for symptoms.    Past Medical History:  Diagnosis Date   Anal fissure    Anxiety    Depression    Gastritis    Headache    MIGRAINES   History of kidney stones 07/2019   Obstruction of right ureteropelvic junction due to stone 06/2023   Renal cyst, left    Stomach ulcer     Patient Active Problem List   Diagnosis Date Noted   Acute UTI 05/20/2024   Kidney stone on left side 05/19/2024   Postpartum care following vaginal delivery 1/16 01/15/2023   SVD (spontaneous vaginal delivery) 01/15/2023   Encounter for induction of labor 01/14/2023    Past Surgical History:  Procedure Laterality Date   CHOLECYSTECTOMY N/A 08/25/2019   Procedure: LAPAROSCOPIC CHOLECYSTECTOMY;  Surgeon: Rodolph Romano, MD;  Location: [facility];  Service: General;  Laterality: N/A;   COLONOSCOPY  AGE 54   CYSTOSCOPY W/ RETROGRADES Bilateral 02/19/2021   Procedure: CYSTOSCOPY WITH RETROGRADE PYELOGRAM;  Surgeon: Penne Knee, MD;  Location: [facility];  Service: Urology;  Laterality: Bilateral;   CYSTOSCOPY W/ RETROGRADES  07/24/2023   Procedure: CYSTOSCOPY WITH RETROGRADE PYELOGRAM;  Surgeon: Penne Knee, MD;  Location: [facility];  Service: Urology;;    CYSTOSCOPY/URETEROSCOPY/HOLMIUM LASER/STENT PLACEMENT Right 02/19/2021   Procedure: CYSTOSCOPY/URETEROSCOPY/HOLMIUM LASER/STENT PLACEMENT;  Surgeon: Penne Knee, MD;  Location: [facility];  Service: Urology;  Laterality: Right;   CYSTOSCOPY/URETEROSCOPY/HOLMIUM LASER/STENT PLACEMENT Right 07/24/2023   Procedure: CYSTOSCOPY/URETEROSCOPY/HOLMIUM LASER/STENT PLACEMENT;  Surgeon: Penne Knee, MD;  Location: [facility];  Service: Urology;  Laterality: Right;   ESOPHAGOGASTRODUODENOSCOPY (EGD) WITH PROPOFOL  N/A 07/12/2019   Procedure: ESOPHAGOGASTRODUODENOSCOPY (EGD) WITH PROPOFOL ;  Surgeon: Gaylyn Gladis PENNER, MD;  Location: [facility];  Service: Endoscopy;  Laterality: N/A;   WISDOM TOOTH EXTRACTION      OB History     Gravida  2   Para  2   Term  2   Preterm  0   AB  0   Living  2      SAB  0   IAB  0   Ectopic  0   Multiple  0   Live Births  2            Home Medications    Prior to Admission medications   Medication Sig Start Date End Date Taking? Authorizing Provider  lidocaine  (XYLOCAINE ) 2 % solution Use as directed 15 mLs in the mouth or throat every 3 (three) hours as needed for mouth pain. Gargle and spit as needed for throat pain 11/28/24  Yes Chandra Raisin A, NP  tamsulosin  (FLOMAX ) 0.4 MG CAPS capsule Take 1 capsule (0.4 mg total) by mouth  daily. 07/24/23   Penne Knee, MD  valACYclovir  (VALTREX ) 1000 MG tablet Take 1 tablet (1,000 mg total) by mouth 2 (two) times daily for 10 days. Patient taking differently: Take 1,000 mg by mouth as needed. 02/17/23       Family History Family History  Problem Relation Age of Onset   Diabetes Maternal Grandfather    Cancer Maternal Grandfather    Pancreatic cancer Maternal Grandfather    Crohn's disease Maternal Grandfather    Colon cancer Neg Hx     Social History Social History   Tobacco Use   Smoking status: Never   Smokeless tobacco: Never  Vaping Use   Vaping status: Never Used  Substance  Use Topics   Alcohol use: Yes    Comment: rarely   Drug use: Never     Allergies   Antacid medicine [traumeel] and Hydrocodone    Review of Systems Review of Systems Per HPI  Physical Exam Triage Vital Signs ED Triage Vitals  Encounter Vitals Group     BP 11/28/24 0821 138/83     Girls Systolic BP Percentile --      Girls Diastolic BP Percentile --      Boys Systolic BP Percentile --      Boys Diastolic BP Percentile --      Pulse Rate 11/28/24 0821 89     Resp 11/28/24 0821 16     Temp 11/28/24 0821 99.2 F (37.3 C)     Temp Source 11/28/24 0821 Oral     SpO2 11/28/24 0821 97 %     Weight 11/28/24 0820 218 lb 14.7 oz (99.3 kg)     Height 11/28/24 0820 5' 2 (1.575 m)     Head Circumference --      Peak Flow --      Pain Score 11/28/24 0820 5     Pain Loc --      Pain Education --      Exclude from Growth Chart --    No data found.  Updated Vital Signs BP 138/83 (BP Location: Right Arm)   Pulse 89   Temp 99.2 F (37.3 C) (Oral)   Resp 16   Ht 5' 2 (1.575 m)   Wt 218 lb 14.7 oz (99.3 kg)   LMP 11/24/2024 (Approximate)   SpO2 97%   Breastfeeding No   BMI 40.04 kg/m   Visual Acuity Right Eye Distance:   Left Eye Distance:   Bilateral Distance:    Right Eye Near:   Left Eye Near:    Bilateral Near:     Physical Exam Vitals and nursing note reviewed.  Constitutional:      General: She is not in acute distress.    Appearance: She is well-developed. She is not toxic-appearing.  HENT:     Head: Normocephalic and atraumatic.     Right Ear: Tympanic membrane and ear canal normal. No drainage, swelling or tenderness. No middle ear effusion. Tympanic membrane is not erythematous.     Left Ear: Tympanic membrane and ear canal normal. No drainage, swelling or tenderness.  No middle ear effusion. Tympanic membrane is not erythematous.     Nose: No congestion or rhinorrhea.     Mouth/Throat:     Mouth: Mucous membranes are moist.     Pharynx: Oropharynx is  clear. Uvula midline. Posterior oropharyngeal erythema present. No oropharyngeal exudate.     Tonsils: Tonsillar exudate present. 1+ on the right. 1+ on the left.  Eyes:  Extraocular Movements:     Right eye: Normal extraocular motion.     Left eye: Normal extraocular motion.  Cardiovascular:     Rate and Rhythm: Normal rate and regular rhythm.  Pulmonary:     Effort: Pulmonary effort is normal. No respiratory distress.     Breath sounds: Normal breath sounds. No wheezing, rhonchi or rales.  Musculoskeletal:     Cervical back: Normal range of motion and neck supple.  Lymphadenopathy:     Cervical: No cervical adenopathy.  Skin:    General: Skin is warm and dry.     Capillary Refill: Capillary refill takes less than 2 seconds.     Coloration: Skin is not pale.     Findings: No erythema or rash.  Neurological:     Mental Status: She is alert and oriented to person, place, and time.  Psychiatric:        Behavior: Behavior is cooperative.      UC Treatments / Results  Labs (all labs ordered are listed, but only abnormal results are displayed) Labs Reviewed  POCT RAPID STREP A (OFFICE) - Normal  POCT MONO SCREEN ([facility]) - Normal  CULTURE, GROUP A STREP [facility])    EKG   Radiology No results found.  Procedures Procedures (including critical care time)  Medications Ordered in UC Medications - No data to display  Initial Impression / Assessment and Plan / UC Course  I have reviewed the triage vital signs and the nursing notes.  Pertinent labs & imaging results that were available during my care of the patient were reviewed by me and considered in my medical decision making (see chart for details).   Vital signs are stable and patient is ill-appearing, although appears nontoxic.  Rapid strep throat test is negative today.  Throat culture is pending.  Centor score today is 3.  Monospot test is negative.  Suspect viral etiology.  Supportive care discussed with patient, start  lidocaine  rinses and continue Tylenol /ibuprofen  as needed.  Return and ER precautions discussed with patient.  The patient was given the opportunity to ask questions.  All questions answered to their satisfaction.  The patient is in agreement to this plan.   Final Clinical Impressions(s) / UC Diagnoses   Final diagnoses:  Sore throat     Discharge Instructions      Strep throat test and mono test are negative today.  Throat culture is pending.  We will contact you if positive later this week.  In the meantime, you can continue Tylenol  or ibuprofen  as needed for fever.  For the throat pain, you can use lidocaine  rinses or Chloraseptic spray, Cepacol lozenges, warm salt water  gargles.  Seek care if symptoms worsen or do not improve despite treatment.     ED Prescriptions     Medication Sig Dispense Auth. Provider   lidocaine  (XYLOCAINE ) 2 % solution Use as directed 15 mLs in the mouth or throat every 3 (three) hours as needed for mouth pain. Gargle and spit as needed for throat pain 100 mL Chandra Harlene LABOR, NP      PDMP not reviewed this encounter.   Chandra Harlene LABOR, NP 11/28/24 [phone]

## 2024-11-28 NOTE — Discharge Instructions (Addendum)
 Strep throat test and mono test are negative today.  Throat culture is pending.  We will contact you if positive later this week.  In the meantime, you can continue Tylenol  or ibuprofen  as needed for fever.  For the throat pain, you can use lidocaine  rinses or Chloraseptic spray, Cepacol lozenges, warm salt water  gargles.  Seek care if symptoms worsen or do not improve despite treatment.

## 2024-12-01 ENCOUNTER — Ambulatory Visit (HOSPITAL_COMMUNITY): Payer: Self-pay

## 2024-12-01 LAB — CULTURE, GROUP A STREP (THRC)
